# Patient Record
Sex: Female | Born: 1975 | Race: White | Hispanic: Yes | Marital: Married | Smoking: Never smoker
Health system: Southern US, Community
[De-identification: ages and names within clinical notes are randomized; demographics above are authoritative.]

## PROBLEM LIST (undated history)

## (undated) DIAGNOSIS — G8929 Other chronic pain: Secondary | ICD-10-CM

## (undated) DIAGNOSIS — Z9289 Personal history of other medical treatment: Secondary | ICD-10-CM

## (undated) DIAGNOSIS — F32A Depression, unspecified: Secondary | ICD-10-CM

## (undated) HISTORY — DX: Depression, unspecified: F32.A

## (undated) HISTORY — DX: Other chronic pain: G89.29

---

## 2002-12-25 ENCOUNTER — Inpatient Hospital Stay (HOSPITAL_COMMUNITY): Admission: AD | Admit: 2002-12-25 | Discharge: 2002-12-25 | Payer: Self-pay | Admitting: Family Medicine

## 2003-01-25 ENCOUNTER — Inpatient Hospital Stay (HOSPITAL_COMMUNITY): Admission: AD | Admit: 2003-01-25 | Discharge: 2003-01-28 | Payer: Self-pay | Admitting: Obstetrics

## 2004-09-25 ENCOUNTER — Ambulatory Visit: Payer: Self-pay | Admitting: Internal Medicine

## 2004-09-27 ENCOUNTER — Ambulatory Visit: Payer: Self-pay | Admitting: *Deleted

## 2004-10-10 ENCOUNTER — Ambulatory Visit: Payer: Self-pay | Admitting: Internal Medicine

## 2005-01-25 ENCOUNTER — Ambulatory Visit (HOSPITAL_COMMUNITY): Admission: RE | Admit: 2005-01-25 | Discharge: 2005-01-25 | Payer: Self-pay | Admitting: Obstetrics and Gynecology

## 2005-01-25 ENCOUNTER — Ambulatory Visit: Payer: Self-pay | Admitting: *Deleted

## 2005-02-06 ENCOUNTER — Ambulatory Visit: Payer: Self-pay | Admitting: Family Medicine

## 2005-02-06 ENCOUNTER — Ambulatory Visit (HOSPITAL_COMMUNITY): Admission: RE | Admit: 2005-02-06 | Discharge: 2005-02-06 | Payer: Self-pay | Admitting: Family Medicine

## 2005-02-12 ENCOUNTER — Ambulatory Visit: Payer: Self-pay | Admitting: *Deleted

## 2005-03-06 ENCOUNTER — Ambulatory Visit: Payer: Self-pay | Admitting: *Deleted

## 2005-03-11 DIAGNOSIS — Z9289 Personal history of other medical treatment: Secondary | ICD-10-CM

## 2005-03-11 HISTORY — PX: ABDOMINAL HYSTERECTOMY: SHX81

## 2005-03-11 HISTORY — DX: Personal history of other medical treatment: Z92.89

## 2005-03-26 ENCOUNTER — Ambulatory Visit (HOSPITAL_COMMUNITY): Admission: RE | Admit: 2005-03-26 | Discharge: 2005-03-26 | Payer: Self-pay | Admitting: Family Medicine

## 2005-03-27 ENCOUNTER — Ambulatory Visit: Payer: Self-pay | Admitting: Obstetrics & Gynecology

## 2005-04-10 ENCOUNTER — Ambulatory Visit (HOSPITAL_COMMUNITY): Admission: RE | Admit: 2005-04-10 | Discharge: 2005-04-10 | Payer: Self-pay | Admitting: Specialist

## 2005-04-10 ENCOUNTER — Ambulatory Visit: Payer: Self-pay | Admitting: *Deleted

## 2005-04-17 ENCOUNTER — Ambulatory Visit: Payer: Self-pay | Admitting: *Deleted

## 2005-04-17 ENCOUNTER — Ambulatory Visit (HOSPITAL_COMMUNITY): Admission: RE | Admit: 2005-04-17 | Discharge: 2005-04-17 | Payer: Self-pay | Admitting: *Deleted

## 2005-05-01 ENCOUNTER — Ambulatory Visit: Payer: Self-pay | Admitting: Obstetrics & Gynecology

## 2005-05-08 ENCOUNTER — Ambulatory Visit (HOSPITAL_COMMUNITY): Admission: RE | Admit: 2005-05-08 | Discharge: 2005-05-08 | Payer: Self-pay | Admitting: *Deleted

## 2005-05-20 ENCOUNTER — Ambulatory Visit: Payer: Self-pay | Admitting: Family Medicine

## 2005-05-27 ENCOUNTER — Ambulatory Visit: Payer: Self-pay | Admitting: Family Medicine

## 2005-06-03 ENCOUNTER — Ambulatory Visit (HOSPITAL_COMMUNITY): Admission: RE | Admit: 2005-06-03 | Discharge: 2005-06-03 | Payer: Self-pay | Admitting: *Deleted

## 2005-06-03 ENCOUNTER — Ambulatory Visit: Payer: Self-pay | Admitting: *Deleted

## 2005-06-10 ENCOUNTER — Ambulatory Visit: Payer: Self-pay | Admitting: Obstetrics & Gynecology

## 2005-06-16 ENCOUNTER — Ambulatory Visit: Payer: Self-pay | Admitting: Gynecology

## 2005-06-16 ENCOUNTER — Inpatient Hospital Stay (HOSPITAL_COMMUNITY): Admission: AD | Admit: 2005-06-16 | Discharge: 2005-06-20 | Payer: Self-pay | Admitting: Gynecology

## 2005-06-16 ENCOUNTER — Encounter (INDEPENDENT_AMBULATORY_CARE_PROVIDER_SITE_OTHER): Payer: Self-pay | Admitting: *Deleted

## 2005-06-24 ENCOUNTER — Ambulatory Visit: Payer: Self-pay | Admitting: *Deleted

## 2005-12-27 ENCOUNTER — Ambulatory Visit: Payer: Self-pay | Admitting: Gynecology

## 2005-12-27 ENCOUNTER — Encounter (INDEPENDENT_AMBULATORY_CARE_PROVIDER_SITE_OTHER): Payer: Self-pay | Admitting: Specialist

## 2007-02-12 ENCOUNTER — Ambulatory Visit: Payer: Self-pay | Admitting: Obstetrics & Gynecology

## 2007-02-13 ENCOUNTER — Ambulatory Visit (HOSPITAL_COMMUNITY): Admission: RE | Admit: 2007-02-13 | Discharge: 2007-02-13 | Payer: Self-pay | Admitting: Obstetrics & Gynecology

## 2008-02-11 ENCOUNTER — Telehealth (INDEPENDENT_AMBULATORY_CARE_PROVIDER_SITE_OTHER): Payer: Self-pay | Admitting: *Deleted

## 2008-02-15 ENCOUNTER — Ambulatory Visit: Payer: Self-pay | Admitting: Nurse Practitioner

## 2008-02-15 LAB — CONVERTED CEMR LAB
Bilirubin Urine: NEGATIVE
Glucose, Urine, Semiquant: NEGATIVE
Ketones, urine, test strip: NEGATIVE
Nitrite: NEGATIVE
Protein, U semiquant: NEGATIVE
Specific Gravity, Urine: 1.01
Urobilinogen, UA: 0.2
WBC Urine, dipstick: NEGATIVE
pH: 6

## 2008-02-16 ENCOUNTER — Encounter (INDEPENDENT_AMBULATORY_CARE_PROVIDER_SITE_OTHER): Payer: Self-pay | Admitting: Family Medicine

## 2008-02-25 ENCOUNTER — Telehealth (INDEPENDENT_AMBULATORY_CARE_PROVIDER_SITE_OTHER): Payer: Self-pay | Admitting: Family Medicine

## 2008-03-07 ENCOUNTER — Ambulatory Visit: Payer: Self-pay | Admitting: Nurse Practitioner

## 2008-03-07 LAB — CONVERTED CEMR LAB
Bilirubin Urine: NEGATIVE
Glucose, Urine, Semiquant: NEGATIVE
KOH Prep: NEGATIVE
Ketones, urine, test strip: NEGATIVE
Nitrite: NEGATIVE
Protein, U semiquant: NEGATIVE
Specific Gravity, Urine: <1.005
Urobilinogen, UA: 0.2
WBC Urine, dipstick: NEGATIVE
pH: 6.5

## 2008-03-08 ENCOUNTER — Encounter (INDEPENDENT_AMBULATORY_CARE_PROVIDER_SITE_OTHER): Payer: Self-pay | Admitting: Family Medicine

## 2008-06-27 ENCOUNTER — Telehealth (INDEPENDENT_AMBULATORY_CARE_PROVIDER_SITE_OTHER): Payer: Self-pay | Admitting: *Deleted

## 2008-06-29 ENCOUNTER — Ambulatory Visit: Payer: Self-pay | Admitting: Nurse Practitioner

## 2008-06-29 DIAGNOSIS — E860 Dehydration: Secondary | ICD-10-CM | POA: Insufficient documentation

## 2008-06-29 DIAGNOSIS — N12 Tubulo-interstitial nephritis, not specified as acute or chronic: Secondary | ICD-10-CM | POA: Insufficient documentation

## 2008-06-29 DIAGNOSIS — R51 Headache: Secondary | ICD-10-CM

## 2008-06-29 DIAGNOSIS — R519 Headache, unspecified: Secondary | ICD-10-CM | POA: Insufficient documentation

## 2008-06-29 LAB — CONVERTED CEMR LAB
Bilirubin Urine: NEGATIVE
Glucose, Urine, Semiquant: NEGATIVE
Ketones, urine, test strip: NEGATIVE
Nitrite: NEGATIVE
Protein, U semiquant: >=300
Specific Gravity, Urine: 1.025
Urobilinogen, UA: 1
pH: 5.5

## 2008-07-11 ENCOUNTER — Ambulatory Visit: Payer: Self-pay | Admitting: Nurse Practitioner

## 2008-07-11 DIAGNOSIS — K644 Residual hemorrhoidal skin tags: Secondary | ICD-10-CM | POA: Insufficient documentation

## 2008-07-11 DIAGNOSIS — K602 Anal fissure, unspecified: Secondary | ICD-10-CM

## 2008-07-11 LAB — CONVERTED CEMR LAB: OCCULT 1: POSITIVE

## 2008-07-13 ENCOUNTER — Telehealth (INDEPENDENT_AMBULATORY_CARE_PROVIDER_SITE_OTHER): Payer: Self-pay | Admitting: Family Medicine

## 2008-10-07 ENCOUNTER — Ambulatory Visit: Payer: Self-pay | Admitting: Nurse Practitioner

## 2008-10-07 DIAGNOSIS — J029 Acute pharyngitis, unspecified: Secondary | ICD-10-CM

## 2008-10-07 LAB — CONVERTED CEMR LAB
Bilirubin Urine: NEGATIVE
Glucose, Urine, Semiquant: NEGATIVE
Ketones, urine, test strip: NEGATIVE
Nitrite: NEGATIVE
Protein, U semiquant: NEGATIVE
Rapid Strep: NEGATIVE
Specific Gravity, Urine: 1.01
Urobilinogen, UA: 0.2
WBC Urine, dipstick: NEGATIVE
pH: 7.5

## 2008-10-08 ENCOUNTER — Encounter (INDEPENDENT_AMBULATORY_CARE_PROVIDER_SITE_OTHER): Payer: Self-pay | Admitting: Nurse Practitioner

## 2009-02-24 ENCOUNTER — Telehealth: Payer: Self-pay | Admitting: Physician Assistant

## 2009-02-24 ENCOUNTER — Ambulatory Visit: Payer: Self-pay | Admitting: Physician Assistant

## 2009-02-24 DIAGNOSIS — R42 Dizziness and giddiness: Secondary | ICD-10-CM

## 2009-02-25 ENCOUNTER — Ambulatory Visit: Payer: Self-pay | Admitting: Advanced Practice Midwife

## 2009-02-25 ENCOUNTER — Inpatient Hospital Stay (HOSPITAL_COMMUNITY): Admission: AD | Admit: 2009-02-25 | Discharge: 2009-02-25 | Payer: Self-pay | Admitting: Obstetrics & Gynecology

## 2009-02-28 ENCOUNTER — Telehealth: Payer: Self-pay | Admitting: Physician Assistant

## 2009-03-01 ENCOUNTER — Ambulatory Visit: Payer: Self-pay | Admitting: Physician Assistant

## 2009-03-01 LAB — CONVERTED CEMR LAB
ALT: 13 units/L (ref 0–35)
AST: 12 units/L (ref 0–37)
Albumin: 4.5 g/dL (ref 3.5–5.2)
Alkaline Phosphatase: 73 units/L (ref 39–117)
BUN: 8 mg/dL (ref 6–23)
CO2: 22 meq/L (ref 19–32)
Calcium: 9.6 mg/dL (ref 8.4–10.5)
Chloride: 104 meq/L (ref 96–112)
Creatinine, Ser: 0.55 mg/dL (ref 0.40–1.20)
Glucose, Bld: 75 mg/dL (ref 70–99)
HCT: 43.9 % (ref 36.0–46.0)
Hemoglobin: 14.7 g/dL (ref 12.0–15.0)
MCHC: 33.5 g/dL (ref 30.0–36.0)
MCV: 88.2 fL (ref 78.0–100.0)
Platelets: 201 10*3/uL (ref 150–400)
Potassium: 4.3 meq/L (ref 3.5–5.3)
RBC: 4.98 M/uL (ref 3.87–5.11)
RDW: 12.8 % (ref 11.5–15.5)
Sodium: 140 meq/L (ref 135–145)
TSH: 1.118 microintl units/mL (ref 0.350–4.500)
Total Bilirubin: 0.8 mg/dL (ref 0.3–1.2)
Total Protein: 7.3 g/dL (ref 6.0–8.3)
WBC: 7 10*3/uL (ref 4.0–10.5)

## 2009-03-02 ENCOUNTER — Encounter: Payer: Self-pay | Admitting: Physician Assistant

## 2009-03-20 ENCOUNTER — Ambulatory Visit: Payer: Self-pay | Admitting: Physician Assistant

## 2009-09-27 ENCOUNTER — Ambulatory Visit: Payer: Self-pay | Admitting: Physician Assistant

## 2009-09-27 DIAGNOSIS — M549 Dorsalgia, unspecified: Secondary | ICD-10-CM | POA: Insufficient documentation

## 2009-09-27 DIAGNOSIS — N390 Urinary tract infection, site not specified: Secondary | ICD-10-CM

## 2009-09-27 DIAGNOSIS — R82998 Other abnormal findings in urine: Secondary | ICD-10-CM

## 2009-09-27 LAB — CONVERTED CEMR LAB
Bilirubin Urine: NEGATIVE
Glucose, Urine, Semiquant: NEGATIVE
KOH Prep: NEGATIVE
Nitrite: NEGATIVE
Pap Smear: NEGATIVE
Protein, U semiquant: NEGATIVE
Specific Gravity, Urine: 1.025
Urobilinogen, UA: 0.2
Whiff Test: NEGATIVE
pH: 5.5

## 2009-09-28 ENCOUNTER — Encounter: Payer: Self-pay | Admitting: Physician Assistant

## 2009-10-02 LAB — CONVERTED CEMR LAB
Casts: NONE SEEN /lpf
Chlamydia, DNA Probe: NEGATIVE
Crystals: NONE SEEN
GC Probe Amp, Genital: NEGATIVE

## 2009-10-03 ENCOUNTER — Encounter: Payer: Self-pay | Admitting: Physician Assistant

## 2009-10-18 ENCOUNTER — Ambulatory Visit: Payer: Self-pay | Admitting: Physician Assistant

## 2009-10-18 ENCOUNTER — Telehealth: Payer: Self-pay | Admitting: Physician Assistant

## 2009-10-18 LAB — CONVERTED CEMR LAB
Bilirubin Urine: NEGATIVE
Cholesterol: 146 mg/dL (ref 0–200)
Glucose, Urine, Semiquant: NEGATIVE
HDL: 53 mg/dL (ref >39)
Ketones, urine, test strip: NEGATIVE
LDL Cholesterol: 80 mg/dL (ref 0–99)
Nitrite: NEGATIVE
Protein, U semiquant: NEGATIVE
Specific Gravity, Urine: 1.015
Total CHOL/HDL Ratio: 2.8
Triglycerides: 63 mg/dL (ref <150)
Urobilinogen, UA: 0.2
VLDL: 13 mg/dL (ref 0–40)
pH: 6.5

## 2009-10-19 ENCOUNTER — Encounter (INDEPENDENT_AMBULATORY_CARE_PROVIDER_SITE_OTHER): Payer: Self-pay | Admitting: Internal Medicine

## 2009-10-20 ENCOUNTER — Encounter: Payer: Self-pay | Admitting: Physician Assistant

## 2009-10-30 ENCOUNTER — Telehealth: Payer: Self-pay | Admitting: Physician Assistant

## 2009-11-03 ENCOUNTER — Ambulatory Visit: Payer: Self-pay | Admitting: Physician Assistant

## 2009-11-03 LAB — CONVERTED CEMR LAB
Bilirubin Urine: NEGATIVE
Glucose, Urine, Semiquant: NEGATIVE
Ketones, urine, test strip: NEGATIVE
Nitrite: NEGATIVE
Protein, U semiquant: NEGATIVE
Specific Gravity, Urine: 1.015
Urobilinogen, UA: 0.2
WBC Urine, dipstick: NEGATIVE
pH: 6

## 2009-11-04 ENCOUNTER — Encounter: Payer: Self-pay | Admitting: Physician Assistant

## 2009-11-06 LAB — CONVERTED CEMR LAB
Bacteria, UA: NONE SEEN
Casts: NONE SEEN /lpf
Crystals: NONE SEEN

## 2009-11-22 ENCOUNTER — Ambulatory Visit: Payer: Self-pay | Admitting: Physician Assistant

## 2009-11-22 LAB — CONVERTED CEMR LAB
Bilirubin Urine: NEGATIVE
Glucose, Urine, Semiquant: NEGATIVE
Ketones, urine, test strip: NEGATIVE
Nitrite: NEGATIVE
Protein, U semiquant: NEGATIVE
Specific Gravity, Urine: 1.02
Urobilinogen, UA: 0.2
pH: 6.5

## 2009-11-23 ENCOUNTER — Encounter: Payer: Self-pay | Admitting: Physician Assistant

## 2009-11-23 LAB — CONVERTED CEMR LAB
Casts: NONE SEEN /lpf
Crystals: NONE SEEN

## 2009-12-04 ENCOUNTER — Encounter (INDEPENDENT_AMBULATORY_CARE_PROVIDER_SITE_OTHER): Payer: Self-pay | Admitting: *Deleted

## 2009-12-27 ENCOUNTER — Telehealth: Payer: Self-pay | Admitting: Physician Assistant

## 2009-12-29 ENCOUNTER — Ambulatory Visit: Payer: Self-pay | Admitting: Physician Assistant

## 2009-12-29 LAB — CONVERTED CEMR LAB
Bilirubin Urine: NEGATIVE
Blood in Urine, dipstick: NEGATIVE
Glucose, Urine, Semiquant: NEGATIVE
Ketones, urine, test strip: NEGATIVE
Nitrite: NEGATIVE
Protein, U semiquant: NEGATIVE
Specific Gravity, Urine: 1.015
Urobilinogen, UA: 0.2
pH: 6

## 2010-03-31 ENCOUNTER — Encounter: Payer: Self-pay | Admitting: *Deleted

## 2010-04-10 NOTE — Progress Notes (Signed)
  Phone Note Outgoing Call   Summary of Call: Please ask patient if her back pain is better since she took the antibiotics a few weeks ago. I am waiting on her urine culture to come back.  Initial call taken by: Tereso Newcomer PA-C,  October 18, 2009 9:48 PM  Follow-up for Phone Call        pt says she is still having back pain.... Follow-up by: Arm[place] Shannon,  October 20, 2009 3:57 PM  Additional Follow-up for Phone Call Additional follow up Details #1::        with u/a results, will tx again with bactrim  Additional Follow-up by: Tereso Newcomer PA-C,  October 20, 2009 3:53 PM    Additional Follow-up for Phone Call Additional follow up Details #2::    [facility] [address] Follow-up by: [place] Shannon,  October 20, 2009 3:58 PM  New/Updated Medications: BACTRIM DS 800-160 MG TABS (SULFAMETHOXAZOLE-TRIMETHOPRIM) Take 1 tablet by mouth two times a day Prescriptions: BACTRIM DS 800-160 MG TABS (SULFAMETHOXAZOLE-TRIMETHOPRIM) Take 1 tablet by mouth two times a day  #14 x 0   Entered and Authorized by:   Tereso Newcomer PA-C   Signed by:   Tereso Newcomer PA-C on 10/20/2009   Method used:   Printed then faxed to ...       [facility] [address] [phone]* (retail)       [address]       [place], [place]  [address]       Ph: [phone]       Fax: [phone]   RxID:   [id number]

## 2010-04-10 NOTE — Letter (Signed)
Summary: *HSN Results Follow up  [facility]  [address] [place], [place] [address]   Phone: [phone]  Fax: [phone]      10/20/2009   Sade DE JESUS Polhamus [address] [place], [place]  [address]   Dear  Ms. Tamela Manolis,                            ____S.Drinkard,FNP   ____D. Gore,FNP       ____B. McPherson,MD   ____V. Rankins,MD    ____E. Mulberry,MD    ____N. Daphine Deutscher, FNP  ____D. Reche Dixon, MD    ____K. Philipp Deputy, MD    __x__S. Alben Spittle, PA-C     This letter is to inform you that your recent test(s):  _______Pap Smear    ___x____Lab Test     _______X-ray    ___x____ is within acceptable limits  _______ requires a medication change  _______ requires a follow-up lab visit  _______ requires a follow-up visit with your provider   Comments:  Your cholesterol numbers are good.       _________________________________________________________ If you have any questions, please contact our office                     Sincerely,  Tereso Newcomer PA-C [facility]

## 2010-04-10 NOTE — Assessment & Plan Note (Signed)
Summary: repeat ua /tmm  Nurse Visit   Allergies: 1)  ! Penicillin 2)  ! Ampicillin Laboratory Results   Urine Tests  Date/Time Received: November 03, 2009 12:01 PM   Routine Urinalysis   Color: lt. yellow Glucose: negative   (Normal Range: Negative) Bilirubin: negative   (Normal Range: Negative) Ketone: negative   (Normal Range: Negative) Spec. Gravity: 1.015   (Normal Range: 1.003-1.035) Blood: moderate   (Normal Range: Negative) pH: 6.0   (Normal Range: 5.0-8.0) Protein: negative   (Normal Range: Negative) Urobilinogen: 0.2   (Normal Range: 0-1) Nitrite: negative   (Normal Range: Negative) Leukocyte Esterace: negative   (Normal Range: Negative)       Orders Added: 1)  Est. Patient Nurse visit [09003] 2)  UA Dipstick w/o Micro (automated)  [81003]  Laboratory Results   Urine Tests    Routine Urinalysis   Color: lt. yellow Glucose: negative   (Normal Range: Negative) Bilirubin: negative   (Normal Range: Negative) Ketone: negative   (Normal Range: Negative) Spec. Gravity: 1.015   (Normal Range: 1.003-1.035) Blood: moderate   (Normal Range: Negative) pH: 6.0   (Normal Range: 5.0-8.0) Protein: negative   (Normal Range: Negative) Urobilinogen: 0.2   (Normal Range: 0-1) Nitrite: negative   (Normal Range: Negative) Leukocyte Esterace: negative   (Normal Range: Negative)       11/03/2009 Did patient state she was doing better. Send u/a for culture and microscopic Tereso Newcomer PA-C  November 03, 2009 3:07 PM

## 2010-04-10 NOTE — Letter (Signed)
Summary: *HSN Results Follow up  [facility]  [address] [place], [place] [address]   Phone: [phone]  Fax: [phone]      12/04/2009   Boots DE JESUS Delillo [address] [place], [place]  [address]   Dear  Ms. Oliva Richter,                            ____S.Drinkard,FNP   ____D. Gore,FNP       ____B. McPherson,MD   ____V. Rankins,MD    ____E. Jerzy Crotteau,MD    ____N. Daphine Deutscher, FNP  ____D. Reche Dixon, MD    ____K. Philipp Deputy, MD    ____Other     This letter is to inform you that your recent test(s):  _______Pap Smear    ___X____Lab Test     _______X-ray    _______ is within acceptable limits  ____X___ requires a medication change  ____X___ requires a follow-up lab visit  _______ requires a follow-up visit with your provider   Comments:  We have been trying to reach you.  Please give the office a call at your earliest convenience.       _________________________________________________________ If you have any questions, please contact our office                     Sincerely,  Armenia Shannon [facility]

## 2010-04-10 NOTE — Progress Notes (Signed)
Summary: Needs repeat U/A  Phone Note Outgoing Call   Summary of Call: Make sure she has f/u u/a planned. See append to urine cx on 10/19/2009 Initial call taken by: Tereso Newcomer PA-C,  October 30, 2009 10:53 PM  Follow-up for Phone Call        Left message on answer machine for pt. to return call. Needs to schedule appt. for U/A. Gaylyn Cheers RN  October 31, 2009 10:24 AM   Additional Follow-up for Phone Call Additional follow up Details #1::        pt is aware... Armenia Shannon  November 01, 2009 12:40 PM

## 2010-04-10 NOTE — Assessment & Plan Note (Signed)
Summary: REPEAT U/A PER PA WEAVER / NS  Nurse Visit Pt presents to clinic for repeat UA. Denies back and lower abd.pain, dysuria, vag discharge,and change in frequency.  Per V.O Tereso Newcomer PA urine to be sent out for microscopic and culture evaulation.   Allergies: 1)  ! Penicillin 2)  ! Ampicillin Laboratory Results   Urine Tests  Date/Time Received: November 22, 2009 Date/Time Reported: 10:16 AM  Routine Urinalysis   Color: dark yellow Appearance: Hazy Glucose: negative   (Normal Range: Negative) Bilirubin: negative   (Normal Range: Negative) Ketone: negative   (Normal Range: Negative) Spec. Gravity: 1.020   (Normal Range: 1.003-1.035) Blood: large   (Normal Range: Negative) pH: 6.5   (Normal Range: 5.0-8.0) Protein: negative   (Normal Range: Negative) Urobilinogen: 0.2   (Normal Range: 0-1) Nitrite: negative   (Normal Range: Negative) Leukocyte Esterace: large   (Normal Range: Negative)       Orders Added: 1)  Est. Patient Level I [[address]] 2)  UA Dipstick W/ Micro (manual) [81000] 3)  T-Culture, Urine [[id number]] 4)  T- * Misc. Laboratory test [phone]  Laboratory Results   Urine Tests    Routine Urinalysis   Color: dark yellow Appearance: Hazy Glucose: negative   (Normal Range: Negative) Bilirubin: negative   (Normal Range: Negative) Ketone: negative   (Normal Range: Negative) Spec. Gravity: 1.020   (Normal Range: 1.003-1.035) Blood: large   (Normal Range: Negative) pH: 6.5   (Normal Range: 5.0-8.0) Protein: negative   (Normal Range: Negative) Urobilinogen: 0.2   (Normal Range: 0-1) Nitrite: negative   (Normal Range: Negative) Leukocyte Esterace: large   (Normal Range: Negative)

## 2010-04-10 NOTE — Assessment & Plan Note (Signed)
Summary: FOLLOW UP IN 2 WEEKS WITH Paddy Walthall///GK   Vital Signs:  Patient profile:   35 year old female Menstrual status:  hysterectomy Height:      64.5 inches Weight:      154 pounds BMI:     26.12 Temp:     98.0 degrees F oral Pulse rate:   69 / minute Pulse rhythm:   regular Resp:     18 per minute BP sitting:   101 / 61  (left arm) Cuff size:   regular  Vitals Entered By: [place] Shannon (March 20, 2009 9:58 AM) CC: f/u on vertigo...Marland KitchenMarland Kitchen pt says she feels alot better Is Patient Diabetic? No Pain Assessment Patient in pain? no       Does patient need assistance? Functional Status Self care Ambulation Normal   Visit Type:  follow up Primary Care Provider:  Tereso Newcomer PA-C  CC:  f/u on vertigo...Marland KitchenMarland Kitchen pt says she feels alot better.  History of Present Illness: Here for f/u. Dizziness is much better. Has not seen anyone yet.  Sees ENT tomorrow. Does not have appt with PT/vestib. rehab. yet. Changed meclizine to valium at last visit.  She took valium three times a day for one week.  DIzziness improved and she has not used since.  She still has some spinning when she lies down.  But, it is not as bad.   No vomiting.  No syncope.  Problems Prior to Update: 1)  Vertigo  (ICD-780.4) 2)  Sore Throat  (ICD-462) 3)  Rectal Fissure  (ICD-565.0) 4)  External Hemorrhoids  (ICD-455.3) 5)  Dehydration  (ICD-276.51) 6)  Headache  (ICD-784.0) 7)  Pyelonephritis  (ICD-590.80)  Current Medications (verified): 1)  Proctosol Hc 2.5 % Crea (Hydrocortisone) .... Apply To Affected Area Two Times A Day As Needed  **pharmacy - D/c Anusol** 2)  Ibuprofen 800 Mg Tabs (Ibuprofen) .... One Tablet By Mouth Two Times A Day As Needed For Pain 3)  Bactrim Ds 800-160 Mg Tabs (Sulfamethoxazole-Trimethoprim) .... One Tablet By Mouth Two Times A Day For Infection 4)  Valium 2 Mg Tabs (Diazepam) .... Take One Tab By Mouth Every 8 Hours As Needed For Dizziness (Please Write in Spanish)  Allergies  (verified): 1)  ! Penicillin 2)  ! Ampicillin  Past History:  Past Surgical History: Last updated: 03/07/2008 Caesarean section x 2 Hysterectomy - TAH 2006  Physical Exam  General:  alert, well-developed, and well-nourished.   Head:  normocephalic and atraumatic.   Eyes:  pupils equal, pupils round, and pupils reactive to light.   Ears:  R ear normal and L ear normal.   Nose:  no external deformity.   Mouth:  pharynx pink and moist, no erythema, and no exudates.   Neck:  supple and no thyromegaly.   Lungs:  normal breath sounds, no crackles, and no wheezes.   Heart:  normal rate and regular rhythm.   Neurologic:  attempted dix hallpike no dizziness  no nystagmus    Impression & Recommendations:  Problem # 1:  VERTIGO (ICD-780.4) much improved f/u with ENT will check on appt with vestib. rehab  Problem # 2:  Preventive Health Care (ICD-V70.0) schedule CPP  Complete Medication List: 1)  Proctosol Hc 2.5 % Crea (Hydrocortisone) .... Apply to affected area two times a day as needed  **pharmacy - d/c anusol** 2)  Ibuprofen 800 Mg Tabs (Ibuprofen) .... One tablet by mouth two times a day as needed for pain 3)  Bactrim Ds 800-160 Mg  Tabs (Sulfamethoxazole-trimethoprim) .... One tablet by mouth two times a day for infection 4)  Valium 2 Mg Tabs (Diazepam) .... Take one tab by mouth every 8 hours as needed for dizziness (please write in spanish)  Patient Instructions: 1)  Please schedule a follow-up appointment in 3 months with Guillermo Nehring for CPP.

## 2010-04-10 NOTE — Letter (Signed)
Summary: *HSN Results Follow up  [facility]  [address] [place], [place] [address]   Phone: [phone]  Fax: [phone]      10/03/2009   Danielle [name] [address] [place], [place]  [address]   Dear  Ms. [name],                            ____S.Drinkard,FNP   ____D. Gore,FNP       ____B. McPherson,MD   ____V. Rankins,MD    ____E. Mulberry,MD    ____N. Daphine Deutscher, FNP  ____D. Reche Dixon, MD    ____K. Philipp Deputy, MD    __x__S. Alben Spittle, PA-C     This letter is to inform you that your recent test(s):  ___x____Pap Smear    _______Lab Test     _______X-ray    ___x____ is normal  _______ requires a medication change  _______ requires a follow-up lab visit  _______ requires a follow-up visit with your provider   Comments:       _________________________________________________________ If you have any questions, please contact our office                     Sincerely,  Tereso Newcomer PA-C [facility]

## 2010-04-10 NOTE — Progress Notes (Signed)
  Phone Note Call from Patient   Summary of Call: I FINALLY SPOKE WITH PT AND LET HER KNOW ABOUT HER URINE INFECTION...PT DID NOT RECIEVE HER RX BECAUSE I WAS UNABLE TO REACH PT..... PT SAYS SHE IS HAVING SOME BACK PROBLEM SO I ASKED PT TO COME SEE [place] IN THE MORNING FOR A URINE SAMPLE.... Initial call taken by: [place] Shannon,  December 27, 2009 3:00 PM  Follow-up for Phone Call        No tx from 9.15 UTI?? I agree.  Rpt u/a before initiating treatment.  Follow-up by: Tereso Newcomer PA-C,  December 27, 2009 3:40 PM  Additional Follow-up for Phone Call Additional follow up Details #1::        thanks pt will be here in the morning Additional Follow-up by: [place] Shannon,  December 27, 2009 4:28 PM

## 2010-04-10 NOTE — Progress Notes (Signed)
Summary: Office Visit//DEPRESSION SCREENING  Office Visit//DEPRESSION SCREENING   Imported By: Arta Bruce 10/02/2009 12:51:54  _____________________________________________________________________  External Attachment:    Type:   Image     Comment:   External Document

## 2010-04-10 NOTE — Assessment & Plan Note (Signed)
Summary: CPP EXAM//GK   Vital Signs:  Patient profile:   35 year old female Menstrual status:  hysterectomy Height:      64.5 inches Weight:      156 pounds BMI:     26.46 Temp:     97.8 degrees F oral Pulse rate:   74 / minute Pulse rhythm:   regular BP sitting:   93 / 63  (left arm) Cuff size:   regular  Vitals Entered By: [place] Shannon (September 27, 2009 3:02 PM) CC: cpp..... Is Patient Diabetic? No Pain Assessment Patient in pain? no       Does patient need assistance? Functional Status Self care Ambulation Normal   Primary Care Provider:  Tereso Newcomer PA-C  CC:  cpp......  History of Present Illness: Here for CPP. Never had abnormal pap. She is s/p TAH. No vaginal bleeding or discharge. Never had a mammo. No FHx of breast cancer. PHQ9=0  Back Pain:  Reports pain in her back for about 2 weeks.  No injury.  No precipitating event.  Points to bilat lumbar area and pain around her scapulae.  No radiation to legs.  No loss of bowel or bladder control.  No weakness, numbness or tingling in legs.  She has taken ibuprofen 800 mg as needed.  She takes every 6 hours.  Does not take every day.  Only takes when she has pain.  Notes pain several days a week.  Notes pain when she lifts things.  She has never had before.  No changes in position makes it better.  Massaging back makes better.   Problems Prior to Update: 1)  Back Pain  (ICD-724.5) 2)  Uti  (ICD-599.0) 3)  Urinalysis, Abnormal  (ICD-791.9) 4)  Preventive Health Care  (ICD-V70.0) 5)  Vertigo  (ICD-780.4) 6)  Sore Throat  (ICD-462) 7)  Rectal Fissure  (ICD-565.0) 8)  External Hemorrhoids  (ICD-455.3) 9)  Dehydration  (ICD-276.51) 10)  Headache  (ICD-784.0) 11)  Pyelonephritis  (ICD-590.80)  Current Medications (verified): 1)  Proctosol Hc 2.5 % Crea (Hydrocortisone) .... Apply To Affected Area Two Times A Day As Needed  **pharmacy - D/c Anusol** 2)  Ibuprofen 800 Mg Tabs (Ibuprofen) .... One Tablet By Mouth  Two Times A Day As Needed For Pain 3)  Bactrim Ds 800-160 Mg Tabs (Sulfamethoxazole-Trimethoprim) .... One Tablet By Mouth Two Times A Day For Infection 4)  Valium 2 Mg Tabs (Diazepam) .... Take One Tab By Mouth Every 8 Hours As Needed For Dizziness (Please Write in Spanish)  Allergies (verified): 1)  ! Penicillin 2)  ! Ampicillin  Past History:  Past Medical History: Vertigo   a.  never did go to appt with ENT and canceled appt with PT (no f/u made)  Past Surgical History: Caesarean section x 2 Hysterectomy - TAH 2006   a.  Cervix preserved and intact   b.  ovaries were left intact  Social History: Reviewed history from 03/07/2008 and no changes required. 3 children Married Tobacco - none ETOH - none Drug - none  Review of Systems      See HPI General:  Denies chills and fever. CV:  Denies chest pain or discomfort and shortness of breath with exertion. Resp:  Denies cough. GI:  Denies bloody stools, dark tarry stools, diarrhea, and vomiting. GU:  Denies dysuria, hematuria, incontinence, and urinary frequency. MS:  See HPI. Neuro:  Denies headaches. Psych:  Denies depression. Heme:  Denies bleeding.  Physical Exam  General:  alert,  well-developed, and well-nourished.   Head:  normocephalic and atraumatic.   Eyes:  pupils equal, pupils round, pupils reactive to light, and no optic disk abnormalities.   Ears:  R ear normal and L ear normal.   Nose:  no external deformity.   Mouth:  pharynx pink and moist.   Neck:  supple.   Breasts:  skin/areolae normal, no masses, no abnormal thickening, no nipple discharge, no tenderness, and no adenopathy.   Lungs:  normal breath sounds, no crackles, and no wheezes.   Heart:  normal rate and regular rhythm.   Abdomen:  soft, non-tender, normal bowel sounds, and no hepatomegaly.   Rectal:  no external abnormalities.   Genitalia:  normal introitus, no external lesions, no vaginal discharge, and mucosa pink and moist.   cervix  intact pap done no adnexal masses no fundus palpated Msk:  normal ROM.   no spinal tend with palp neg SLR bilat  ?+CVA tenderness bilat (R>L)   Extremities:  no edema  Neurologic:  alert & oriented X3 and cranial nerves II-XII intact.   Skin:  turgor normal.   Psych:  normally interactive.     Impression & Recommendations:  Problem # 1:  PREVENTIVE HEALTH CARE (ICD-V70.0) s/p TAH needs vaginal smear q 5 years  Orders: KOH/ WET Mount [phone]) T-Pap Smear, Thin Prep (29562) UA Dipstick w/o Micro (manual) ([address]) T- GC Chlamydia (57846)  Problem # 2:  UTI (ICD-599.0) with her symptoms of back pain, ? all from UTI eventhough she has no dysuria will treat for UTI if back pain no better, patient should follow up  repeat u/a in 3 weeks    The following medications were removed from the medication list:    Bactrim Ds 800-160 Mg Tabs (Sulfamethoxazole-trimethoprim) ..... One tablet by mouth two times a day for infection Her updated medication list for this problem includes:    Cipro 500 Mg Tabs (Ciprofloxacin hcl) .Marland Kitchen... Take 1 tablet by mouth two times a day  Problem # 3:  BACK PAIN (ICD-724.5) ? related to UTI Tx UTI and f/u if no resolution  Her updated medication list for this problem includes:    Ibuprofen 800 Mg Tabs (Ibuprofen) ..... One tablet by mouth two times a day as needed for pain  Complete Medication List: 1)  Ibuprofen 800 Mg Tabs (Ibuprofen) .... One tablet by mouth two times a day as needed for pain 2)  Cipro 500 Mg Tabs (Ciprofloxacin hcl) .... Take 1 tablet by mouth two times a day  Other Orders: T-Culture, Urine ([id number]) T- * Misc. Laboratory test [phone])  Patient Instructions: 1)  Return for repeat urinalysis in 3 weeks. 2)  Set up follow up with Rita Prom  if back pain does not resolve after taking the antibiotics. 3)  Schedule fasting lipids with the lab in next few weeks. 4)  Schedule CPP with [name] in 2 years. Prescriptions: CIPRO 500 MG  TABS (CIPROFLOXACIN HCL) Take 1 tablet by mouth two times a day  #14 x 0   Entered and Authorized by:   Tereso Newcomer PA-C   Signed by:   Tereso Newcomer PA-C on 09/27/2009   Method used:   Print then Give to Patient   RxID:   [id number]   Laboratory Results   Urine Tests    Routine Urinalysis   Glucose: negative   (Normal Range: Negative) Bilirubin: negative   (Normal Range: Negative) Ketone: trace (5)   (Normal Range: Negative) Spec. Gravity: 1.025   (Normal Range:  1.003-1.035) Blood: large   (Normal Range: Negative) pH: 5.5   (Normal Range: 5.0-8.0) Protein: negative   (Normal Range: Negative) Urobilinogen: 0.2   (Normal Range: 0-1) Nitrite: negative   (Normal Range: Negative) Leukocyte Esterace: large   (Normal Range: Negative)      Wet Mount Source: vaginal WBC/hpf: 1-5 Bacteria/hpf: rare Clue cells/hpf: none  Negative whiff Yeast/hpf: none Wet Mount KOH: Negative Trichomonas/hpf: none

## 2010-07-24 NOTE — Group Therapy Note (Signed)
NAMEJOREEN, Danielle [name]              ACCOUNT NO.:  [id number]   MEDICAL RECORD NO.:  [id number]          PATIENT TYPE:  WOC   LOCATION:  [facility]                   FACILITY:  [facility]   PHYSICIAN:  Johnella Moloney, MD        DATE OF BIRTH:  [birth year]   DATE OF SERVICE:  02/12/2007                                  CLINIC NOTE   CHIEF COMPLAINT:  Vaginal bleeding.   HISTORY OF PRESENT ILLNESS:  The patient is a 35 year old female status  post rapid hysterectomy secondary to bleeding of June 16, 2005.  Of note  the patient had supracervical hysterectomy at the time.  The patient  noted that she has been doing well up until two weeks ago when she noted  some bleeding per vagina.  She noticed a small amount of blood every  time she urinated, and she noted it on the toilet tissue that she used  to wipe.  She did not notice any underwear staining or any blood outside  of when she uses the bathroom and notices that after wiping.  The  patient also noted that she has had moderate lower abdominal pain and  lower back pain since this bleeding started.  She denies any fevers,  chills, sweats, nausea, vomiting, abnormal vaginal discharge or any  other concerns.  The patient denies having any vaginal bleeding since  her hysterectomy.  She had Pap smear two months ago __________  .  This  was normal according to the patient.  She denies any dysuria or other  symptoms.   PHYSICAL EXAMINATION:  VITAL SIGNS: Temperature 97.7, pulse 74, blood  pressure 112/71, weight 163.5 pounds.  GENERAL:  No apparent distress.  ABDOMEN:  Soft, moderate lower abdominal tenderness diffusely.  No  rebound or guarding.  BACK:  Some CVA tenderness elicited on the right side, also some lower  back tenderness on palpation.  PELVIC:  Normal external female genitalia.  No lesions observed on  speculum examination, pink, well-rugated vagina, normal cervix noted, no  bleeding nor any discharge noted from the cervix.  No blood or  any trace  of blood noted in the vagina, small amount of cervical motion tenderness  during the examination.  Adnexa unable to be palpated secondary to  habitus.   ASSESSMENT/PLAN:  The patient is a 35 year old woman status post  supracervical hysterectomy in April 2007 here with vaginal bleeding.  On  examination no vaginal bleeding observed.  Given her history the  differential includes bleeding from the genitourinary tract which could  be indicative of her urinary tract infections or kidney stones  especially given her costovertebral angle tenderness.  The patient could  also be having bleeding from her cervix given that she does have  endocervical glands that could undergo hormonal cycles and lead to some  shedding.  She could also have cervicitis which could be causing  bleeding.  Given this differential a gonorrhea, chlamydia culture was  obtained from the patient's cervix.  Will also send her urine for  urinalysis and culture to rule out any infection, and also schedule the  patient for a renal ultrasound  to rule out a kidney stone.  These plans  were discussed  with the patient who was agreeable to plan.  Patient will return in two  weeks for discussion of these results and further evaluation.           ______________________________  Johnella Moloney, MD     UD/MEDQ  D:  02/12/2007  T:  02/12/2007  Job:  [id number]

## 2010-07-27 NOTE — Op Note (Signed)
[name], [name]              ACCOUNT NO.:  [id number]   MEDICAL RECORD NO.:  [id number]          PATIENT TYPE:  WOC   LOCATION:  WOC                          FACILITY:  [facility]   PHYSICIAN:  Ginger Carne, MD  DATE OF BIRTH:  [birth year]   DATE OF PROCEDURE:  06/16/2005  DATE OF DISCHARGE:                                 OPERATIVE REPORT   PREOPERATIVE DIAGNOSIS:  6/10 biophysical profile, uninducible cervix and  previous cesarean section.   POSTOP DIAGNOSIS:  6/10 biophysical profile, uninducible cervix and previous  cesarean section. Term viable delivery of female infant.   PROCEDURE:  Repeat low transverse cesarean section.   SURGEON:  Ginger Carne, MD   ASSISTANT:  None.   COMPLICATIONS:  None immediate.   ESTIMATED BLOOD LOSS:  600 mL.   ANESTHESIA:  Spinal.   SPECIMEN:  Cord bloods and placenta.   OPERATIVE FINDINGS:  A term infant female delivered in a vertex presentation  on 06/16/2005.  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  Otherwise no  gross abnormalities.  The baby did not cry spontaneously at delivery and the  NICU staff was present for resuscitation and intubation.  Amniotic fluid was  clear.  Three-vessel cord central insertion.  Placenta complete.  Uterus,  tubes and ovaries showed normal decidual changes of pregnancy.   OPERATIVE PROCEDURE:  The patient prepped and draped in the usual fashion  and placed in the left lateral supine position.  Betadine solution used for  antiseptic.  The patient catheterized prior to procedure.  After adequate  spinal analgesia, a vertical incision was made on the abdomen and the  abdomen opened the old scar was removed from a previous vertical incision.  The lower uterine segment incised transversely after developing the bladder  flap.  Baby delivered, cord clamped, cut and infant handed to the pediatric  staff after bulb  suctioning.  Placenta removed manually.  Uterus inspected.  Closure of the uterine musculature in one layers of Vicryl running suture.  Bleeding  points hemostatically checked.  Blood clots removed.  Closure of the fascia  and parietal peritoneum in one layer of 0 Vicryl suture and skin staples for  the skin.  Instrument and sponge count were correct.  The patient tolerated  the procedure well.  Returned to post anesthesia recovery room in excellent  condition.      Ginger Carne, MD  Electronically Signed     SHB/MEDQ  D:  06/16/2005  T:  06/17/2005  Job:  [id number]

## 2010-07-27 NOTE — Discharge Summary (Signed)
Danielle [name], Danielle [name]              ACCOUNT NO.:  [id number]   MEDICAL RECORD NO.:  [id number]           PATIENT TYPE:   LOCATION:                                 FACILITY:   PHYSICIAN:  Angie B. Merlene Morse, MD       DATE OF BIRTH:   DATE OF ADMISSION:  06/16/2005  DATE OF DISCHARGE:  06/20/2005                                 DISCHARGE SUMMARY   ADMISSION DIAGNOSES:  1.  Term intrauterine pregnancy.  2.  Nonreactive NST.   DISCHARGE DIAGNOSES:  1.  Status post C-section.  2.  Status post hysterectomy.  3.  Postpartum hemorrhage.   HISTORY OF PRESENT ILLNESS:  This is a 35 year old G4, P2-0-1-2 at 40-4/7  weeks who was admitted complaining of decreased fetal movement with a  nonreactive NST who was found to have a BPP of 6/10 for no breathing seen.  The patient also had a fetus with a known encephalocele.   HOSPITAL COURSE:  The patient was admitted as her cervix was not favorable.  The patient underwent a C-section on April 8.  See op note for further  information.  The patient was found to be bleeding significantly postpartum  and had hypotension.  She was taken back.  It was difficult to palpate her  uterus.  She was taken back to the operating room.  Clotted blood was noted  in the peritoneal cavity.  The uterus was removed.  Please see op note for  more information.  The patient also had a previous C-section.  The patient  was transfused three units.  The patient remained stable postpartum.  The  patient complained of some low thorax pain on April 9 that radiated to her  shoulder.  She was found to have air under her abdomen on chest x-ray.  An  EKG done was normal as well.  The patient remained stable afterwards and  complained of no more shortness of breath or chest pain.  The patient did  have a hemoglobin of 6.8 on April 10 so the patient was transfused on April  11 and she was symptomatic with this.  She was transfused one unit.  The  patient continued to recover and was  ambulating well at the time of  discharge.   DISPOSITION:  The patient is discharged to home.   FOLLOW UP:  She is to follow up with the GYN clinic in two weeks.  She is to  call for appointment.  She is to return to the MAU on Sunday for staple  removal.   ACTIVITY:  The patient is to lift nothing heavier than the baby.   DISCHARGE MEDICATIONS:  1.  Ibuprofen 600 mg one p.o. q.6 h., #30.  2.  Colace 100 mg one p.o. b.i.d., #20.  3.  Ferrous sulfate 345 mg one p.o. b.i.d., #60 with one refill.  4.  Percocet 5 one p.o. q.4-6 h. p.r.n. pain, #20.           ______________________________  Karoline Caldwell B. Merlene Morse, MD     ABC/MEDQ  D:  06/20/2005  T:  06/20/2005  Job:  [id number]

## 2010-07-27 NOTE — Op Note (Signed)
NAMELEIRA, REGINO              ACCOUNT NO.:  [id number]   MEDICAL RECORD NO.:  [id number]          PATIENT TYPE:  WOC   LOCATION:  WOC                          FACILITY:  [facility]   PHYSICIAN:  Ginger Carne, MD  DATE OF BIRTH:  09-17-1975   DATE OF PROCEDURE:  06/16/2005  DATE OF DISCHARGE:                                 OPERATIVE REPORT   PREOPERATIVE DIAGNOSIS:  Post cesarean section intra-abdominal hemorrhage.   POSTOP DIAGNOSIS:  Post cesarean section intra-abdominal hemorrhage, post  cesarean section uterine rupture with atony and accumulated clots.   PROCEDURE:  Total abdominal hysterectomy with preservation of both tubes and  ovaries.   SURGEON:  Ginger Carne, MD   ASSISTANT:  Jeb Levering, C.N.M.   ESTIMATED BLOOD LOSS:  1500 mL including both preoperative and  intraoperative blood loss.   ANESTHESIA:  General.   SPECIMEN:  Uterus and cervix.   OPERATIVE FINDINGS:  Upon opening the abdomen, fresh clotted blood was noted  in the peritoneal cavity.  The uterus was 18 weeks in size significantly  boggy with accumulated clots and the left half of the lower uterine incision  was ruptured including the branches of the left uterine artery.  The ureters  were identified bilaterally, both visually and by palpation and were intact.  Both tubes and ovaries appeared to be normal.  The lower uterine segment on  the vaginal side was virtually paper thin at the time of closure during her  cesarean section and earlier part of the evening.  The lower half was  significantly ruptured and incapable of a safe and proper repair to assure  no further hemorrhage or active bleeding.   OPERATIVE PROCEDURE:  The patient prepped in the usual fashion and placed in  the supine position.  Betadine solution used for antiseptic.  The patient  had been previously catheterized.  After removing skin staples, abdomen  opened.  Blood clots were removed.  Self-retaining retractors  utilized.  Attention was directed to ensuring that the bladder had been taken down off  the cervix.  After identifying the source of active bleeding on the left  side, the ureters were carefully identified and the decision was made to  perform a hysterectomy.   The utero-ovarian ligaments were identified and clamped, cut and ligated  with 0 Vicryl suture.  The round ligaments similarly were clamped and  ligated with 0 Vicryl suture.  Anterior and posterior peritoneal flaps were  developed, dissected off uterus and in a standard Richardson fashion, the  uterine vasculature was clamped, cut and ligated with 0 Vicryl suture.  Afterwards, the cervix was identified and removed from the vagina proper,  the cuff closed with the 0 Vicryl running and interrupted sutures.  Bleeding  points hemostatically checked.  No active bleeding noted at end of the  procedure.  All blood clots removed.  Copious irrigation with removal of  irrigant followed.  Closure of the incision with PDS suture from either end  to midline including the fascia and portions of the peritoneum and skin  staples for the skin.  The patient tolerated  procedure well, returned to the  post anesthesia recovery room in excellent condition.  Three units of typed  and cross matched packed red blood cells were administered both  preoperatively and intraoperatively.  The urine clear at end of the  procedure.      Ginger Carne, MD  Electronically Signed     SHB/MEDQ  D:  06/16/2005  T:  06/17/2005  Job:  [id number]

## 2010-12-17 LAB — POCT URINALYSIS DIP (DEVICE)
Bilirubin Urine: NEGATIVE
Ketones, ur: NEGATIVE
Specific Gravity, Urine: 1.015
pH: 7

## 2012-09-21 ENCOUNTER — Ambulatory Visit: Payer: No Typology Code available for payment source | Attending: Family Medicine | Admitting: Internal Medicine

## 2012-09-21 VITALS — BP 119/95 | HR 70 | Temp 97.0°F | Resp 18 | Wt 169.6 lb

## 2012-09-21 DIAGNOSIS — M549 Dorsalgia, unspecified: Secondary | ICD-10-CM | POA: Insufficient documentation

## 2012-09-21 NOTE — Progress Notes (Signed)
Patient here to establish care Takes no medication  

## 2012-09-21 NOTE — Progress Notes (Signed)
Patient ID: [name], female   DOB: Nov 17, 1975, 37 y.o.   MRN: [id number] Patient Demographics  Danielle [name], is a 37 y.o. female  ELF:[id number]  [id number]  DOB - 04-06-1975  Chief Complaint  Patient presents with  . Annual Exam        Subjective:   Danielle [name] today is here to establish primary care. Patient has no significant past medical history. In the 2007, while pregnant patient required a total hysterectomy for postpartum hemorrhage. Currently patient has no complaints-with the exception of back pain that is chronic and unchanged. This has been going on for past 3-4 years. There is no history of fever, urinary or fecal incontinence. There is no history of any weakness in her lower extremities. Patient has also has No headache, No chest pain, No abdominal pain,No Nausea, No new weakness tingling or numbness, No Cough or SOB.   Objective:    Filed Vitals:   09/21/12 1200  BP: 119/95  Pulse: 70  Temp: 97 F (36.1 C)  Resp: 18  Weight: 169 lb 9.6 oz (76.93 kg)  SpO2: 99%     ALLERGIES:   Allergies  Allergen Reactions  . Ampicillin   . Penicillins     REACTION: rash    PAST MEDICAL HISTORY: History reviewed. No pertinent past medical history.  PAST SURGICAL HISTORY: Cesarean section and total hysterectomy  FAMILY HISTORY: History reviewed. No pertinent family history.  MEDICATIONS AT HOME: Prior to Admission medications   Not on File    SOCIAL HISTORY:   has no tobacco, alcohol, and drug history on file.  REVIEW OF SYSTEMS:  Constitutional:   No   Fevers, chills, fatigue.  HEENT:    No headaches, Sore throat,   Cardio-vascular: No chest pain,  Orthopnea, swelling in lower extremities, anasarca, palpitations  GI:  No abdominal pain, nausea, vomiting, diarrhea  Resp: No shortness of breath,  No coughing up of blood.No cough.No wheezing.  Skin:  no rash or lesions.  GU:  no dysuria, change in color of urine, no  urgency or frequency.  No flank pain.  Musculoskeletal: No joint pain or swelling.  No decreased range of motion.  Psych: No change in mood or affect. No depression or anxiety.  No memory loss.   Exam  General appearance :Awake, alert, not in any distress. Speech Clear. Not toxic Looking HEENT: Atraumatic and Normocephalic, pupils equally reactive to light and accomodation Neck: supple, no JVD. No cervical lymphadenopathy.  Chest:Good air entry bilaterally, no added sounds  CVS: S1 S2 regular, no murmurs.  Abdomen: Bowel sounds present, Non tender and not distended with no gaurding, rigidity or rebound. Extremities: B/L Lower Ext shows no edema, both legs are warm to touch Neurology: Awake alert, and oriented X 3, CN II-XII intact, Non focal Skin:No Rash Wounds:N/A    Data Review   CBC No results found for this basename: WBC, HGB, HCT, PLT, MCV, MCH, MCHC, RDW, NEUTRABS, LYMPHSABS, MONOABS, EOSABS, BASOSABS, BANDABS, BANDSABD,  in the last 168 hours  Chemistries   No results found for this basename: NA, K, CL, CO2, GLUCOSE, BUN, CREATININE, GFRCGP, CALCIUM, MG, AST, ALT, ALKPHOS, BILITOT,  in the last 168 hours ------------------------------------------------------------------------------------------------------------------ No results found for this basename: HGBA1C,  in the last 72 hours ------------------------------------------------------------------------------------------------------------------ No results found for this basename: CHOL, HDL, LDLCALC, TRIG, CHOLHDL, LDLDIRECT,  in the last 72 hours ------------------------------------------------------------------------------------------------------------------ No results found for this basename: TSH, T4TOTAL, FREET3, T3FREE, THYROIDAB,  in the last 72 hours ------------------------------------------------------------------------------------------------------------------  No results found for this basename: VITAMINB12,  FOLATE, FERRITIN, TIBC, IRON, RETICCTPCT,  in the last 72 hours  Coagulation profile  No results found for this basename: INR, PROTIME,  in the last 168 hours    Assessment & Plan   Back pain - This is chronic, continue with as needed nonsteroidal anti-inflammatories - Obtain x-ray of the back  Gen. Health maintenance - Status post total hysterectomy-? Need for Pap smear - Check routine labs  Return to clinic in one month-depending on lab results, will decide on further health maintenance needs.

## 2012-09-24 ENCOUNTER — Ambulatory Visit (HOSPITAL_COMMUNITY)
Admission: RE | Admit: 2012-09-24 | Discharge: 2012-09-24 | Disposition: A | Discharge status: Home / Self Care | Payer: No Typology Code available for payment source | Source: Ambulatory Visit | Attending: Internal Medicine | Admitting: Internal Medicine

## 2012-09-24 DIAGNOSIS — Z9089 Acquired absence of other organs: Secondary | ICD-10-CM | POA: Insufficient documentation

## 2012-09-24 DIAGNOSIS — M549 Dorsalgia, unspecified: Secondary | ICD-10-CM

## 2012-09-24 DIAGNOSIS — M545 Low back pain, unspecified: Secondary | ICD-10-CM | POA: Insufficient documentation

## 2012-10-12 ENCOUNTER — Ambulatory Visit: Payer: No Typology Code available for payment source | Attending: Family Medicine

## 2012-10-12 ENCOUNTER — Ambulatory Visit (HOSPITAL_COMMUNITY): Payer: No Typology Code available for payment source

## 2012-10-12 DIAGNOSIS — M549 Dorsalgia, unspecified: Secondary | ICD-10-CM | POA: Insufficient documentation

## 2012-10-12 LAB — TSH: TSH: 1.321 u[IU]/mL (ref 0.350–4.500)

## 2012-10-12 LAB — LIPID PANEL
HDL: 47 mg/dL (ref >39)
LDL Cholesterol: 103 mg/dL — ABNORMAL HIGH (ref 0–99)
Total CHOL/HDL Ratio: 3.5 Ratio
VLDL: 15 mg/dL (ref 0–40)

## 2012-10-12 LAB — CBC
MCH: 29.2 pg (ref 26.0–34.0)
MCHC: 34.4 g/dL (ref 30.0–36.0)
MCV: 84.9 fL (ref 78.0–100.0)
Platelets: 196 10*3/uL (ref 150–400)
RDW: 13.6 % (ref 11.5–15.5)

## 2012-10-12 LAB — COMPREHENSIVE METABOLIC PANEL
ALT: 35 U/L (ref 0–35)
AST: 20 U/L (ref 0–37)
Alkaline Phosphatase: 82 U/L (ref 39–117)
Creat: 0.63 mg/dL (ref 0.50–1.10)
Sodium: 139 mEq/L (ref 135–145)
Total Bilirubin: 0.5 mg/dL (ref 0.3–1.2)

## 2012-10-12 LAB — HEMOGLOBIN A1C: Hgb A1c MFr Bld: 5.4 % (ref <5.7)

## 2012-10-15 ENCOUNTER — Other Ambulatory Visit: Payer: No Typology Code available for payment source

## 2012-10-20 ENCOUNTER — Encounter: Payer: Self-pay | Admitting: Obstetrics & Gynecology

## 2012-10-22 ENCOUNTER — Ambulatory Visit: Payer: No Typology Code available for payment source

## 2012-12-07 ENCOUNTER — Encounter: Payer: No Typology Code available for payment source | Admitting: Obstetrics & Gynecology

## 2013-12-28 ENCOUNTER — Ambulatory Visit: Payer: Self-pay

## 2014-04-15 ENCOUNTER — Encounter (HOSPITAL_COMMUNITY): Payer: Self-pay | Admitting: Emergency Medicine

## 2014-05-18 ENCOUNTER — Ambulatory Visit: Payer: No Typology Code available for payment source | Attending: Internal Medicine | Admitting: Internal Medicine

## 2014-05-18 ENCOUNTER — Encounter: Payer: Self-pay | Admitting: Internal Medicine

## 2014-05-18 VITALS — BP 111/75 | HR 68 | Temp 97.7°F | Resp 16 | Ht 64.5 in | Wt 164.0 lb

## 2014-05-18 DIAGNOSIS — R5383 Other fatigue: Secondary | ICD-10-CM | POA: Insufficient documentation

## 2014-05-18 DIAGNOSIS — M545 Low back pain, unspecified: Secondary | ICD-10-CM

## 2014-05-18 LAB — CBC
HEMATOCRIT: 42.5 % (ref 36.0–46.0)
HEMOGLOBIN: 14.4 g/dL (ref 12.0–15.0)
MCH: 29.2 pg (ref 26.0–34.0)
MCHC: 33.9 g/dL (ref 30.0–36.0)
MCV: 86.2 fL (ref 78.0–100.0)
MPV: 12.1 fL (ref 8.6–12.4)
Platelets: 215 10*3/uL (ref 150–400)
RBC: 4.93 MIL/uL (ref 3.87–5.11)
RDW: 13.8 % (ref 11.5–15.5)
WBC: 6.1 10*3/uL (ref 4.0–10.5)

## 2014-05-18 LAB — COMPLETE METABOLIC PANEL WITH GFR
ALK PHOS: 97 U/L (ref 39–117)
ALT: 24 U/L (ref 0–35)
AST: 17 U/L (ref 0–37)
Albumin: 4.4 g/dL (ref 3.5–5.2)
BUN: 10 mg/dL (ref 6–23)
CHLORIDE: 101 meq/L (ref 96–112)
CO2: 25 mEq/L (ref 19–32)
CREATININE: 0.52 mg/dL (ref 0.50–1.10)
Calcium: 9.9 mg/dL (ref 8.4–10.5)
GLUCOSE: 77 mg/dL (ref 70–99)
POTASSIUM: 4.6 meq/L (ref 3.5–5.3)
Sodium: 140 mEq/L (ref 135–145)
Total Bilirubin: 0.6 mg/dL (ref 0.2–1.2)
Total Protein: 7.5 g/dL (ref 6.0–8.3)

## 2014-05-18 LAB — TSH: TSH: 1.775 u[IU]/mL (ref 0.350–4.500)

## 2014-05-18 MED ORDER — IBUPROFEN 800 MG PO TABS: 800.0000 mg | ORAL_TABLET | Freq: Two times a day (BID) | ORAL | Status: DC | PRN

## 2014-05-18 NOTE — Progress Notes (Signed)
Patient ID: Danielle [name], female   DOB: June 15, 1975, 39 y.o.   MRN: [id number]  DJM:[id number]  QIW:[id number]  DOB - May 04, 1975  CC:  Chief Complaint  Patient presents with  . Establish Care       HPI: Danielle [name] is a 39 y.o. female here today to establish medical care. She has no past medical history.  She c/o of lower back pain for the past three weeks. The pain is above her waist and is exacerbated by bending and twisting.  She denies any injuries. She does lift her daughter often.  The pain does not radiate. The pain is described as a sharp pain. She has tried ibuprofen 400 mg daily for pain.   She has had concerns of fatigue and sleepiness. She does not have a menstrual period since having vaginal parts removed. She states that she had hemorrhages 9 years ago and had to have surgery but is unsure what was removed. She has had a pap smear since then and was told to continue to have yearly pap smears.      Patient has No headache, No chest pain, No abdominal pain - No Nausea, No new weakness tingling or numbness, No Cough - SOB.  Allergies  Allergen Reactions  . Ampicillin   . Penicillins     REACTION: rash   History reviewed. No pertinent past medical history. No current outpatient prescriptions on file prior to visit.   No current facility-administered medications on file prior to visit.   History reviewed. No pertinent family history. History   Social History  . Marital Status: Married    Spouse Name: N/A  . Number of Children: N/A  . Years of Education: N/A   Occupational History  . Not on file.   Social History Main Topics  . Smoking status: Never Smoker   . Smokeless tobacco: Not on file  . Alcohol Use: No  . Drug Use: No  . Sexual Activity: Not on file   Other Topics Concern  . Not on file   Social History Narrative    Review of Systems: See HPI    Objective:   Filed Vitals:   05/18/14 1205  BP: 111/75  Pulse: 68  Temp: 97.7 F  (36.5 C)  Resp: 16    Physical Exam: Constitutional: Patient appears well-developed and well-nourished. No distress. HENT: Normocephalic, atraumatic, External right and left ear normal. Oropharynx is clear and moist.  Eyes: Conjunctivae and EOM are normal. PERRLA, no scleral icterus. Neck: Normal ROM. Neck supple. No JVD. No tracheal deviation. No thyromegaly. CVS: RRR, S1/S2 +, no murmurs, no gallops, no carotid bruit.  Pulmonary: Effort and breath sounds normal, no stridor, rhonchi, wheezes, rales.  Abdominal: Soft. BS +, no distension, tenderness, rebound or guarding.  Musculoskeletal: Normal range of motion. No edema and no tenderness.  Neuro: Alert. Normal reflexes, muscle tone coordination. No cranial nerve deficit. Skin: Skin is warm and dry. No rash noted. Not diaphoretic. No erythema. No pallor. Psychiatric: Normal mood and affect. Behavior, judgment, thought content normal.  Lab Results  Component Value Date   WBC 5.6 10/12/2012   HGB 14.5 10/12/2012   HCT 42.2 10/12/2012   MCV 84.9 10/12/2012   PLT 196 10/12/2012   Lab Results  Component Value Date   CREATININE 0.63 10/12/2012   BUN 11 10/12/2012   NA 139 10/12/2012   K 4.4 10/12/2012   CL 106 10/12/2012   CO2 26 10/12/2012    Lab Results  Component  Value Date   HGBA1C 5.4 10/12/2012   Lipid Panel     Component Value Date/Time   CHOL 165 10/12/2012 0926   TRIG 76 10/12/2012 0926   HDL 47 10/12/2012 0926   CHOLHDL 3.5 10/12/2012 0926   VLDL 15 10/12/2012 0926   LDLCALC 103* 10/12/2012 0926       Assessment and plan:   Dyneisha was seen today for establish care.  Diagnoses and all orders for this visit:  Other fatigue Orders: -     CBC -     COMPLETE METABOLIC PANEL WITH GFR -     TSH -     Vitamin D, 25-hydroxy -     Hemoglobin A1c  Bilateral low back pain without sciatica -     ibuprofen (ADVIL,MOTRIN) 800 MG tablet; Take 1 tablet (800 mg total) by mouth 2 (two) times daily as  needed. Patient may try heating pad as well  Return if symptoms worsen or fail to improve.  The patient was given clear instructions to go to ER or return to medical center if symptoms don't improve, worsen or new problems develop. The patient verbalized understanding. The patient was told to call to get lab results if they haven't heard anything in the next week.     Chari Manning, NP-C [facility] [phone] 05/18/2014, 12:34 PM

## 2014-05-18 NOTE — Progress Notes (Signed)
Pt is here to establish care. Pt is here c/o lower back pain for about 3 weeks.  She states that she is tired and feels very sleepy.

## 2014-05-18 NOTE — Patient Instructions (Signed)
Dolor de espalda en el adulto °(Back Pain, Adult) ° El dolor de cintura es frecuente. Aproximadamente 1 de cada 5 personas lo sufren. La causa rara vez pone en peligro la vida. Con frecuencia mejora luego de algún tiempo. Alrededor de la mitad de las personas que sufren un inicio súbito de dolor de cintura, se sentirán mejor luego de 2 semanas. Aproximadamente 8 de cada 10 se sentirán mejor luego de 6 semanas.  °CAUSAS  °Algunas causas comunes son:  °· Distensión de los músculos o ligamentos que sostienen la columna vertebral. °· Desgaste (degeneración) de los discos vertebrales. °· Artritis. °· Traumatismos directos en la espalda. °DIAGNÓSTICO  °La mayor parte de las veces, la causa directa no se conoce. Sin embargo, el dolor puede tratarse efectivamente aún cuando no se conozca la causa. Una de las formas más precisas de asegurar que la causa del dolor no constituye un peligro es responder a las preguntas del médico acerca de su salud y sus síntomas. Si el médico necesita más información, podrá indicar análisis de laboratorio o realizar un diagnóstico por imágenes (radiografías o resonancia magnética). Sin embargo, aunque las imágenes muestren modificaciones, generalmente no es necesaria la cirugía.  °INSTRUCCIONES PARA EL CUIDADO EN EL HOGAR  °En algunas personas, el dolor de espalda vuelve. Como rara vez es peligroso, los pacientes pueden aprender a manejarlo ellos mismos.  °· Manténgase activo. Si permanece sentado o de pie mucho tiempo en el mismo lugar, se tensiona la espalda.  No se siente, maneje ni se quede parado en un mismo lugar por más de 30 minutos. Realice caminatas cortas en superficies planas ni bien el dolor haya cedido. Trate de aumentar cada día el tiempo que camina . °· No se quede en la cama. Si hace reposo durante más de 1 o 2 días, puede retrasar la recuperación. °· No evite los ejercicios ni el trabajo. El cuerpo está hecho para moverse. No es peligroso estar activo, aunque le duela la  espalda. La espalda se curará más rápido si continúa sus actividades antes de que el dolor se vaya. °· Preste atención a su cuerpo cuando se incline y se levante. Muchas personas sienten menos molestias cuando levantan objetos si doblan las rodillas, mantienen la carga cerca del cuerpo y evitan torcerse. Generalmente, las posiciones más cómodas son las que ejercen menos tensión en la espalda en recuperación. °· Encuentre una posición cómoda para dormir. Utilice un colchón firme y recuéstese de costado. Doble ligeramente sus rodillas. Si se recuesta sobre su espalda, coloque una almohada debajo de sus rodillas. °· Tome sólo medicamentos de venta libre o recetados, según las indicaciones del médico. Los medicamentos de venta libre para calmar el dolor y reducir la inflamación, son los que en general más ayudan. El médico podrá prescribirle relajantes musculares. Estos medicamentos calman el dolor de modo que pueda retornar a sus actividades normales y a realizar ejercicios saludables. °· Aplique hielo sobre la zona lesionada. °¨ Ponga el hielo en una bolsa plástica. °¨ Colóquese una toalla entre la piel y la bolsa de hielo. °¨ Deje la bolsa de hielo durante 15 a 20 minutos 3 a 4 veces por día, durante los primeros 2 ó 3 días. Luego podrá alternar entre calor y hielo para reducir el dolor y los espasmos. °· Consulte a su médico si puede tratar de hacer ejercicios para la espalda y recibir un masaje suave. Pueden ser beneficiosos. °· Evite sentirse ansioso o estresado. El estrés aumenta la tensión muscular y puede empeorar el dolor de espalda. Es importante reconocer cuando está ansioso o estresado y aprender la forma   de controlarlos. El ejercicio es una gran opción. °SOLICITE ATENCIÓN MÉDICA SI:  °· Siente un dolor que no se alivia con reposo o medicamentos. °· El dolor no mejora en 1 semana. °· Desarrolla nuevos síntomas. °· No se siente bien en general. °SOLICITE ATENCIÓN MÉDICA DE INMEDIATO SI: °· Siente un dolor  que se irradia desde la espalda hacia sus piernas. °· Desarrolla nuevos problemas en el intestino o la vejiga. °· Siente debilidad o adormecimiento inusual en sus brazos o piernas. °· Presenta náuseas o vómitos. °· Presenta dolor abdominal. °· Se siente desfalleciente. °Document Released: 02/25/2005 Document Revised: 08/27/2011 °ExitCare® Patient Information ©2015 [employer]. This information is not intended to replace advice given to you by your health care provider. Make sure you discuss any questions you have with your health care provider. ° °

## 2014-05-19 LAB — HEMOGLOBIN A1C
Hgb A1c MFr Bld: 5.6 % (ref <5.7)
Mean Plasma Glucose: 114 mg/dL (ref <117)

## 2014-05-19 LAB — VITAMIN D 25 HYDROXY (VIT D DEFICIENCY, FRACTURES): VIT D 25 HYDROXY: 15 ng/mL — AB (ref 30–100)

## 2014-05-27 ENCOUNTER — Telehealth: Payer: Self-pay | Admitting: *Deleted

## 2014-05-27 MED ORDER — ERGOCALCIFEROL 1.25 MG (50000 UT) PO CAPS: 50000.0000 [IU] | ORAL_CAPSULE | ORAL | Status: DC

## 2014-05-27 NOTE — Telephone Encounter (Signed)
Pt is aware of her lab results.  

## 2014-05-27 NOTE — Telephone Encounter (Signed)
-----   Message from Lance Bosch, NP sent at 05/23/2014  7:22 PM EDT ----- Vitamin D is low. Please send drisdol 50,000 IU to take once weekly for 12 weeks. 12 tablets no refills. All the labs are normal

## 2014-09-15 ENCOUNTER — Ambulatory Visit: Payer: No Typology Code available for payment source | Attending: Internal Medicine | Admitting: Internal Medicine

## 2014-09-15 ENCOUNTER — Encounter: Payer: Self-pay | Admitting: Internal Medicine

## 2014-09-15 VITALS — BP 113/77 | HR 62 | Temp 98.0°F | Ht 65.0 in | Wt 166.0 lb

## 2014-09-15 DIAGNOSIS — M549 Dorsalgia, unspecified: Secondary | ICD-10-CM | POA: Insufficient documentation

## 2014-09-15 DIAGNOSIS — M545 Low back pain, unspecified: Secondary | ICD-10-CM

## 2014-09-15 MED ORDER — IBUPROFEN 800 MG PO TABS: 800.0000 mg | ORAL_TABLET | Freq: Two times a day (BID) | ORAL | Status: DC | PRN

## 2014-09-15 MED ORDER — CYCLOBENZAPRINE HCL 5 MG PO TABS: 5.0000 mg | ORAL_TABLET | Freq: Three times a day (TID) | ORAL | Status: DC | PRN

## 2014-09-15 NOTE — Patient Instructions (Signed)

## 2014-09-15 NOTE — Progress Notes (Signed)
Patient here to follow up on her lower back pain.  The pain does not radiate down her legs and the ibuprofen does help when the pain is mild.  She needs a refill. No other complaints

## 2014-09-15 NOTE — Progress Notes (Signed)
Patient ID: Danielle [name], female   DOB: 1975-10-05, 39 y.o.   MRN: [id number]  CC: back pain  HPI: Danielle [name] is a 39 y.o. female here today for a follow up visit.  Patient has no past medical history. She c/o of lower back pain for the past three weeks. The pain is above her waist and is exacerbated by bending and twisting. She denies any injuries. She does lift her daughter often. The pain does not radiate. The pain is described as a sharp pain. She has tried ibuprofen 800 mg twice daily for pain. Patient reports that she has not had any changes in pain character. She denies bowel or bladder dysfunction.   Patient has No headache, No chest pain, No abdominal pain - No Nausea, No new weakness tingling or numbness, No Cough - SOB.  Allergies  Allergen Reactions  . Ampicillin   . Penicillins     REACTION: rash   History reviewed. No pertinent past medical history. Current Outpatient Prescriptions on File Prior to Visit  Medication Sig Dispense Refill  . ibuprofen (ADVIL,MOTRIN) 800 MG tablet Take 1 tablet (800 mg total) by mouth 2 (two) times daily as needed. 60 tablet 0  . ergocalciferol (VITAMIN D2) 50000 UNITS capsule Take 1 capsule (50,000 Units total) by mouth once a week. (Patient not taking: Reported on 09/15/2014) 12 capsule 0   No current facility-administered medications on file prior to visit.   History reviewed. No pertinent family history. History   Social History  . Marital Status: Married    Spouse Name: N/A  . Number of Children: N/A  . Years of Education: N/A   Occupational History  . Not on file.   Social History Main Topics  . Smoking status: Never Smoker   . Smokeless tobacco: Not on file  . Alcohol Use: No  . Drug Use: No  . Sexual Activity: Not on file   Other Topics Concern  . Not on file   Social History Narrative    Review of Systems: See HPI.    Objective:   Filed Vitals:   09/15/14 0906  BP: 113/77  Pulse: 62  Temp: 98  F (36.7 C)    Physical Exam  Constitutional: She is oriented to person, place, and time.  Cardiovascular: Normal rate, regular rhythm and normal heart sounds.   Pulmonary/Chest: Effort normal and breath sounds normal.  Musculoskeletal: She exhibits no tenderness.  Neurological: She is alert and oriented to person, place, and time.  Skin: Skin is warm and dry.  Psychiatric: She has a normal mood and affect.   Lab Results  Component Value Date   WBC 6.1 05/18/2014   HGB 14.4 05/18/2014   HCT 42.5 05/18/2014   MCV 86.2 05/18/2014   PLT 215 05/18/2014   Lab Results  Component Value Date   CREATININE 0.52 05/18/2014   BUN 10 05/18/2014   NA 140 05/18/2014   K 4.6 05/18/2014   CL 101 05/18/2014   CO2 25 05/18/2014    Lab Results  Component Value Date   HGBA1C 5.6 05/18/2014   Lipid Panel     Component Value Date/Time   CHOL 165 10/12/2012 0926   TRIG 76 10/12/2012 0926   HDL 47 10/12/2012 0926   CHOLHDL 3.5 10/12/2012 0926   VLDL 15 10/12/2012 0926   LDLCALC 103* 10/12/2012 0926       Assessment and plan:   Danielle [name] was seen today for back pain.  Diagnoses and all orders  for this visit:  Bilateral low back pain without sciatica Orders: -     Refill ibuprofen (ADVIL,MOTRIN) 800 MG tablet; Take 1 tablet (800 mg total) by mouth 2 (two) times daily as needed. -     Ambulatory referral to Orthopedic Surgery -     Begin cyclobenzaprine (FLEXERIL) 5 MG tablet; Take 1 tablet (5 mg total) by mouth 3 (three) times daily as needed for muscle spasms.  Due to language barrier, an interpreter was present during the history-taking and subsequent discussion (and for part of the physical exam) with this patient.  Return if symptoms worsen or fail to improve.       Lance Bosch, [place] [facility] [facility] [phone] 09/15/2014, 9:16 AM

## 2014-10-28 ENCOUNTER — Emergency Department (HOSPITAL_COMMUNITY)
Admission: EM | Admit: 2014-10-28 | Discharge: 2014-10-28 | Disposition: A | Discharge status: Home / Self Care | Payer: Self-pay | Source: Home / Self Care | Attending: Family Medicine | Admitting: Family Medicine

## 2014-10-28 ENCOUNTER — Encounter (HOSPITAL_COMMUNITY): Payer: Self-pay | Admitting: Emergency Medicine

## 2014-10-28 DIAGNOSIS — S161XXA Strain of muscle, fascia and tendon at neck level, initial encounter: Secondary | ICD-10-CM

## 2014-10-28 MED ORDER — METAXALONE 800 MG PO TABS: 800.0000 mg | ORAL_TABLET | Freq: Three times a day (TID) | ORAL | Status: DC

## 2014-10-28 NOTE — ED Provider Notes (Signed)
CSN: [id number]     Arrival date & time 10/28/14  1415 History   First MD Initiated Contact with Patient 10/28/14 1431     Chief Complaint  Patient presents with  . Neck Pain   (Consider location/radiation/quality/duration/timing/severity/associated sxs/prior Treatment) Patient is a 39 y.o. female presenting with neck injury. The history is provided by the patient.  Neck Injury This is a new problem. The current episode started more than 2 days ago. The problem has been gradually worsening. Pertinent negatives include no chest pain and no abdominal pain. Associated symptoms comments: Onset after sleep has gone from left to right side, no neuro sx, no fever, is sore with rom. No adenopathy..    Past Medical History  Diagnosis Date  . Back pain    Past Surgical History  Procedure Laterality Date  . Cesarean section    . Abdominal hysterectomy     No family history on file. Social History  Substance Use Topics  . Smoking status: Never Smoker   . Smokeless tobacco: None  . Alcohol Use: No   OB History    No data available     Review of Systems  Constitutional: Negative.   Cardiovascular: Negative for chest pain.  Gastrointestinal: Negative.  Negative for abdominal pain.  Musculoskeletal: Positive for neck pain. Negative for neck stiffness.    Allergies  Ampicillin and Penicillins  Home Medications   Prior to Admission medications   Medication Sig Start Date End Date Taking? Authorizing Provider  cyclobenzaprine (FLEXERIL) 5 MG tablet Take 1 tablet (5 mg total) by mouth 3 (three) times daily as needed for muscle spasms. 09/15/14   Lance Bosch, NP  ergocalciferol (VITAMIN D2) 50000 UNITS capsule Take 1 capsule (50,000 Units total) by mouth once a week. Patient not taking: Reported on 09/15/2014 05/27/14   Lance Bosch, NP  ibuprofen (ADVIL,MOTRIN) 800 MG tablet Take 1 tablet (800 mg total) by mouth 2 (two) times daily as needed. 09/15/14   Lance Bosch, NP  metaxalone  (SKELAXIN) 800 MG tablet Take 1 tablet (800 mg total) by mouth 3 (three) times daily. As muscle relaxer. 10/28/14   Billy Fischer, MD   There were no vitals taken for this visit. Physical Exam  Constitutional: She appears well-developed and well-nourished. No distress.  Neck: Trachea normal and normal range of motion. Neck supple. Muscular tenderness present. No spinous process tenderness present. No rigidity. Normal range of motion present.  Lymphadenopathy:    She has no cervical adenopathy.  Nursing note and vitals reviewed.   ED Course  Procedures (including critical care time) Labs Review Labs Reviewed - No data to display  Imaging Review No results found.   MDM   1. Cervical strain, acute, initial encounter        Billy Fischer, MD 10/28/14 1505

## 2014-10-28 NOTE — ED Notes (Signed)
Patient reports 3 day history of neck neck/shoulder discomfort.  The first day, discomfort rand own right side of neck, now pain is along left side of neck.  No known injury.  No numbness, no tingling, no headache.  [facility] for [facility] back pain.

## 2014-11-04 ENCOUNTER — Emergency Department (HOSPITAL_COMMUNITY): Payer: Self-pay

## 2014-11-04 ENCOUNTER — Emergency Department (HOSPITAL_COMMUNITY)
Admission: EM | Admit: 2014-11-04 | Discharge: 2014-11-04 | Disposition: A | Discharge status: Home / Self Care | Payer: Self-pay | Attending: Physician Assistant | Admitting: Physician Assistant

## 2014-11-04 ENCOUNTER — Emergency Department (HOSPITAL_COMMUNITY): Payer: No Typology Code available for payment source

## 2014-11-04 ENCOUNTER — Encounter (HOSPITAL_COMMUNITY): Payer: Self-pay | Admitting: Emergency Medicine

## 2014-11-04 DIAGNOSIS — R000 Tachycardia, unspecified: Secondary | ICD-10-CM | POA: Insufficient documentation

## 2014-11-04 DIAGNOSIS — Z880 Allergy status to penicillin: Secondary | ICD-10-CM | POA: Insufficient documentation

## 2014-11-04 DIAGNOSIS — R0602 Shortness of breath: Secondary | ICD-10-CM | POA: Insufficient documentation

## 2014-11-04 DIAGNOSIS — Y9389 Activity, other specified: Secondary | ICD-10-CM | POA: Insufficient documentation

## 2014-11-04 DIAGNOSIS — S301XXA Contusion of abdominal wall, initial encounter: Secondary | ICD-10-CM | POA: Insufficient documentation

## 2014-11-04 DIAGNOSIS — S20212A Contusion of left front wall of thorax, initial encounter: Secondary | ICD-10-CM | POA: Insufficient documentation

## 2014-11-04 DIAGNOSIS — Y999 Unspecified external cause status: Secondary | ICD-10-CM | POA: Insufficient documentation

## 2014-11-04 DIAGNOSIS — Y92410 Unspecified street and highway as the place of occurrence of the external cause: Secondary | ICD-10-CM | POA: Insufficient documentation

## 2014-11-04 DIAGNOSIS — Z79899 Other long term (current) drug therapy: Secondary | ICD-10-CM | POA: Insufficient documentation

## 2014-11-04 DIAGNOSIS — T1490XA Injury, unspecified, initial encounter: Secondary | ICD-10-CM

## 2014-11-04 DIAGNOSIS — R0789 Other chest pain: Secondary | ICD-10-CM

## 2014-11-04 LAB — I-STAT CHEM 8, ED
BUN: 13 mg/dL (ref 6–20)
CALCIUM ION: 1.13 mmol/L (ref 1.12–1.23)
CREATININE: 0.7 mg/dL (ref 0.44–1.00)
Chloride: 104 mmol/L (ref 101–111)
Glucose, Bld: 106 mg/dL — ABNORMAL HIGH (ref 65–99)
HEMATOCRIT: 45 % (ref 36.0–46.0)
HEMOGLOBIN: 15.3 g/dL — AB (ref 12.0–15.0)
Potassium: 3.3 mmol/L — ABNORMAL LOW (ref 3.5–5.1)
Sodium: 140 mmol/L (ref 135–145)
TCO2: 20 mmol/L (ref 0–100)

## 2014-11-04 LAB — CBC WITH DIFFERENTIAL/PLATELET
Basophils Absolute: 0 10*3/uL (ref 0.0–0.1)
Basophils Relative: 0 % (ref 0–1)
Eosinophils Absolute: 0.1 10*3/uL (ref 0.0–0.7)
Eosinophils Relative: 1 % (ref 0–5)
HCT: 40.2 % (ref 36.0–46.0)
HEMOGLOBIN: 13.9 g/dL (ref 12.0–15.0)
LYMPHS ABS: 3.7 10*3/uL (ref 0.7–4.0)
LYMPHS PCT: 40 % (ref 12–46)
MCH: 29.6 pg (ref 26.0–34.0)
MCHC: 34.6 g/dL (ref 30.0–36.0)
MCV: 85.5 fL (ref 78.0–100.0)
Monocytes Absolute: 0.4 10*3/uL (ref 0.1–1.0)
Monocytes Relative: 5 % (ref 3–12)
NEUTROS ABS: 4.9 10*3/uL (ref 1.7–7.7)
NEUTROS PCT: 54 % (ref 43–77)
Platelets: 176 10*3/uL (ref 150–400)
RBC: 4.7 MIL/uL (ref 3.87–5.11)
RDW: 12.6 % (ref 11.5–15.5)
WBC: 9.2 10*3/uL (ref 4.0–10.5)

## 2014-11-04 LAB — COMPREHENSIVE METABOLIC PANEL
ALK PHOS: 80 U/L (ref 38–126)
ALT: 24 U/L (ref 14–54)
AST: 23 U/L (ref 15–41)
Albumin: 4.3 g/dL (ref 3.5–5.0)
Anion gap: 11 (ref 5–15)
BUN: 11 mg/dL (ref 6–20)
CALCIUM: 9.3 mg/dL (ref 8.9–10.3)
CO2: 22 mmol/L (ref 22–32)
CREATININE: 0.76 mg/dL (ref 0.44–1.00)
Chloride: 103 mmol/L (ref 101–111)
Glucose, Bld: 113 mg/dL — ABNORMAL HIGH (ref 65–99)
Potassium: 3.4 mmol/L — ABNORMAL LOW (ref 3.5–5.1)
Sodium: 136 mmol/L (ref 135–145)
Total Bilirubin: 0.8 mg/dL (ref 0.3–1.2)
Total Protein: 7.4 g/dL (ref 6.5–8.1)

## 2014-11-04 MED ORDER — OXYCODONE-ACETAMINOPHEN 5-325 MG PO TABS: 2.0000 | ORAL_TABLET | Freq: Four times a day (QID) | ORAL | Status: DC | PRN

## 2014-11-04 MED ORDER — ONDANSETRON 4 MG PO TBDP
8.0000 mg | ORAL_TABLET | Freq: Once | ORAL | Status: AC
  Administered 2014-11-04: 8 mg via ORAL
  Filled 2014-11-04: qty 2

## 2014-11-04 MED ORDER — FENTANYL CITRATE (PF) 100 MCG/2ML IJ SOLN
50.0000 ug | Freq: Once | INTRAMUSCULAR | Status: AC
  Administered 2014-11-04: 50 ug via INTRAVENOUS
  Filled 2014-11-04: qty 2

## 2014-11-04 MED ORDER — HYDROMORPHONE HCL 1 MG/ML IJ SOLN
1.0000 mg | Freq: Once | INTRAMUSCULAR | Status: AC
  Administered 2014-11-04: 1 mg via INTRAVENOUS
  Filled 2014-11-04: qty 1

## 2014-11-04 MED ORDER — IOHEXOL 300 MG/ML  SOLN
100.0000 mL | Freq: Once | INTRAMUSCULAR | Status: AC | PRN
  Administered 2014-11-04: 100 mL via INTRAVENOUS
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
## 2014-11-04 NOTE — ED Notes (Signed)
Patient transported to CT 

## 2014-11-04 NOTE — ED Notes (Signed)
Ambulated pt in the room pt ambulated well pt still complains of being sore in her chest and feeling nauseous no other complaints noted at this time
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
## 2014-11-04 NOTE — Discharge Instructions (Signed)
Traumatismo contuso (Blunt Trauma) Usted ha sido evaluado por sus lesiones. Fue examinado y Writer que lo asiste no Estate manager/land agent lesiones lo suficientemente graves como para que requiera hospitalizacin. Luego de un accidente, es comn presentar mltiples magullones y dolores musculares. Estos tienden a Production designer, theatre/television/film las primeras 24 horas, con ms rigidez y Radiation protection practitioner las horas siguientes, que empeorarn cuando se levante de la cama la primera maana luego del accidente. A partir de all, debera comenzar a Charity fundraiser que pase. El nivel de mejora generalmente depende de la importancia de las lesiones sufridas en el accidente. Luego del accidente, si alguna parte de su cuerpo no responde como debera, o si el dolor en alguna zona se incrementa, debe regresar al [employer] para que lo examinen nuevamente.  INSTRUCCIONES PARA EL CUIDADO DOMICILIARIO Los cuidados de rutina para las zonas doloridas deben incluir:  [employer] en las zonas doloridas cada 2 horas durante 20 minutos mientras est despierto durante los prximos 2 das.  Beber lquidos en abundancia (excluya el alcohol).  Darse una ducha caliente o tibia o tomar un bao una o dos veces por da para aumentar el flujo sanguneo en los msculos doloridos. Esto lo ayudar a Nurse, learning disability.  Actividades segn las tolere. Levantar pesos Chief Operating Officer de cuello o espalda.  Utilice los medicamentos de venta libre o de prescripcin para Conservation officer, historic buildings, Health and safety inspector o la [place], segn se lo indique el profesional que lo asiste. No tome aspirina. Podran aumentar los hematomas o las hemorragias en caso de que existan pequeas zonas en las que esto ocurre. [place] DE INMEDIATO SI OBSERVA:  Entumecimiento, hormigueo, debilidad o problemas con el uso de los brazos o las piernas.  Dolor de cabeza intenso que no mejora con medicamentos.  Cambios en el control del intestino o la vejiga.  Aumento del dolor  en otras partes del cuerpo.  Dificultades respiratorias, mareos.  Nuseas, vmitos o sudoracin.  Aumento del malestar abdominal (en el vientre).  Sangre en la orina, en [place].  Dolor en los hombros en el rea donde se ubicaran los tirantes de [place] prenda.  Sensacin de [employer] o si ha sufrido un episodio de [place]. En algunos casos no es posible identificar todas las lesiones inmediatamente despus del traumatismo. Es importante que siga controlando su enfermedad despus de la visita al servicio de Multimedia programmer. Si siente que no mejora, o mejora ms lentamente que lo que debera esperarse, llame a su mdico. Si siente que sus sntomas (problemas) empeoran, vuelva al Servicio de Emergencias inmediatamente. Document Released: 02/25/2005 Document Revised: 05/20/2011 [facility] Patient Information 2015 [place]. This information is not intended to replace advice given to you by your health care provider. Make sure you discuss any questions you have with your health care provider.
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
## 2014-11-04 NOTE — ED Notes (Signed)
Patient vomiting at this time.

## 2014-11-04 NOTE — Progress Notes (Signed)
Chaplain was called to help patient's daughter, who was crying in the hall outside the CT room.  She was relieved when her mom was finished and taken back to room.   Chaplain will follow.  Rev. [place], [place]

## 2014-11-12 ENCOUNTER — Emergency Department (INDEPENDENT_AMBULATORY_CARE_PROVIDER_SITE_OTHER)
Admission: EM | Admit: 2014-11-12 | Discharge: 2014-11-12 | Disposition: A | Discharge status: Home / Self Care | Payer: No Typology Code available for payment source | Source: Home / Self Care | Attending: Family Medicine | Admitting: Family Medicine

## 2014-11-12 ENCOUNTER — Encounter (HOSPITAL_COMMUNITY): Payer: Self-pay | Admitting: Emergency Medicine

## 2014-11-12 DIAGNOSIS — M791 Myalgia, unspecified site: Secondary | ICD-10-CM

## 2014-11-12 DIAGNOSIS — M533 Sacrococcygeal disorders, not elsewhere classified: Secondary | ICD-10-CM

## 2014-11-12 DIAGNOSIS — R0789 Other chest pain: Secondary | ICD-10-CM

## 2014-11-12 DIAGNOSIS — R109 Unspecified abdominal pain: Secondary | ICD-10-CM

## 2014-11-12 DIAGNOSIS — Z041 Encounter for examination and observation following transport accident: Secondary | ICD-10-CM

## 2014-11-12 DIAGNOSIS — S20212D Contusion of left front wall of thorax, subsequent encounter: Secondary | ICD-10-CM

## 2014-11-12 DIAGNOSIS — Z043 Encounter for examination and observation following other accident: Secondary | ICD-10-CM

## 2014-11-12 MED ORDER — OXYCODONE-ACETAMINOPHEN 5-325 MG PO TABS: 1.0000 | ORAL_TABLET | ORAL | Status: DC | PRN

## 2014-11-12 NOTE — Discharge Instructions (Signed)
Dolor abdominal (Abdominal Pain) El dolor puede tener muchas causas. Normalmente la causa del dolor abdominal no es una enfermedad y Teacher, English as a foreign language sin Clinical research associate. Frecuentemente puede controlarse y tratarse en casa. Su mdico le Chartered certified accountant examen fsico y posiblemente solicite anlisis de sangre y radiografas para ayudar a Teacher, adult education la gravedad de su dolor. Sin embargo, en [employer], debe transcurrir ms tiempo antes de que se pueda Pension scheme manager una causa evidente del dolor. Antes de llegar a ese punto, es posible que su mdico no sepa si necesita ms pruebas o un tratamiento ms profundo. INSTRUCCIONES PARA EL CUIDADO EN EL HOGAR  Est atento al dolor para ver si hay cambios. Las siguientes indicaciones ayudarn a Chief Strategy Officer que pueda sentir:  [place] solo medicamentos de venta libre o recetados, segn las indicaciones del mdico.  No tome laxantes a menos que se lo haya indicado su mdico.  Pruebe con Ardelia Mems dieta lquida absoluta (caldo, t o agua) segn se lo indique su mdico. Introduzca gradualmente una dieta normal, segn su tolerancia. SOLICITE ATENCIN MDICA SI:  Tiene dolor abdominal sin explicacin.  Tiene dolor abdominal relacionado con nuseas o diarrea.  Tiene dolor cuando orina o defeca.  Experimenta dolor abdominal que lo despierta de noche.  Tiene dolor abdominal que empeora o mejora cuando come alimentos.  Tiene dolor abdominal que empeora cuando come alimentos grasosos.  Tiene fiebre. SOLICITE ATENCIN MDICA DE INMEDIATO SI:   El dolor no desaparece en un plazo mximo de 2horas.  No deja de (vomitar).  El Social research officer, government se siente solo en partes del abdomen, como el lado derecho o la parte inferior izquierda del abdomen.  Evaca materia fecal sanguinolenta o negra, de aspecto alquitranado. ASEGRESE DE QUE:  Comprende estas instrucciones.  Controlar su afeccin.  Recibir ayuda de inmediato si no mejora o si empeora. Document Released: 02/25/2005  Document Revised: 03/02/2013 [facility] Patient Information 2015 [place]. This information is not intended to replace advice given to you by your health care provider. Make sure you discuss any questions you have with your health care provider.  Dolor de la pared torcica  (Chest Wall Pain)  El dolor en la pared torcica se siente en la zona de los huesos y msculos del trax. Podrn pasar hasta 6 semanas hasta que comience a mejorar. Podra pasar ms tiempo si es Customer service manager. Puede aparecer sin motivo. Otras veces, algunos factores como grmenes, lesiones, tos o ejercicios pueden Multimedia programmer. CUIDADOS EN EL HOGAR   Evite las actividades que lo cansen o le causen Social research officer, government. Trate de no usar los msculos del pecho, el vientre (abdomen) ni los msculos laterales. No levante objetos pesados.  Aplique hielo sobre la zona dolorida.  Ponga el hielo en una bolsa plstica.  Colquese una toalla entre la piel y la bolsa de hielo.  Deje el hielo durante 15 a 20 minutos durante los 2 primeros das.  Slo tome los medicamentos que le indique el mdico. SOLICITE AYUDA DE INMEDIATO SI:   Siente ms dolor o el dolor es muy molesto.  Tiene fiebre.  El dolor en el pecho [place].  Tiene nuevos sntomas.  Tiene malestar estomacal (nuseas) ovmitos.  Si se siente sudoroso o mareado.  Tiene tos con mucosidad (flema).  Tose y escupe sangre. ASEGRESE DE QUE:   Comprende estas instrucciones.  Controlar su enfermedad.  Solicitar ayuda de inmediato si no mejora o si empeora. Document Released: 02/14/2011 Document Revised: 05/20/2011 [facility] Patient Information 2015 [place]. This information is not intended  to replace advice given to you by your health care provider. Make sure you discuss any questions you have with your health care provider.  Dolor muscular (Muscle Pain) Las causas del dolor muscular (mialgia) pueden ser [place], entre ellas:  Uso excesivo del msculo  o distensin muscular, en especial si la persona no est en buen estado fsico. Esta es la causa ms comn del dolor muscular.  Lesiones.  [place].  Virus, como el de la gripe.  Enfermedades infecciosas.  Fibromialgia, que es una afeccin crnica que causa dolor de [place], fatiga y dolor muscular con la palpacin.  Enfermedades autoinmunes, como el lupus.  Determinados medicamentos, como los inhibidores de la ECA y las estatinas. El dolor muscular puede ser leve o intenso. La mayora de las veces, el dolor dura solo un corto perodo y desaparece sin tratamiento. Para diagnosticar la causa del dolor muscular, el mdico le preguntar los antecedentes mdicos. Esto significa que le preguntar cundo comenz Physiological scientist y qu ha sucedido desde ese momento. Si el dolor muscular no comenz hace mucho tiempo, el mdico puede esperar antes de hacer diversas pruebas. Si el dolor muscular comenz hace mucho tiempo, el mdico puede decidir que es ms conveniente hacer pruebas de inmediato. Si el mdico cree que Physiological scientist puede estar causado por una enfermedad, es posible que necesite hacer pruebas adicionales para descartar determinadas afecciones.  El tratamiento del dolor muscular depende de la causa. El cuidado en el hogar a menudo es suficiente para Stage manager. El mdico tambin puede recetarle un medicamento antinflamatorio. INSTRUCCIONES PARA EL CUIDADO EN EL HOGAR Controle su afeccin para ver si hay cambios. Las siguientes indicaciones ayudarn a Chief Strategy Officer que pueda sentir:  [employer] medicamentos de venta libre o recetados solamente segn las indicaciones del mdico.  Aplique hielo en el msculo dolorido:  Ponga el hielo en una bolsa plstica.  Colquese una toalla entre la piel y la bolsa de hielo.  Aplique el hielo de 3 a 4 veces por da durante 15 a 79minutos.  Puede alternar la colocacin de compresas calientes y fras en el msculo  segn lo que le haya indicado el mdico.  Si el uso excesivo del msculo le est causando dolor muscular, disminuya las actividades hasta que el dolor desaparezca.  Recuerde que es normal sentir algo de dolor muscular despus de comenzar un programa de entrenamiento. Generalmente duelen aquellos msculos que no se utilizan con frecuencia.  Si usted no hace actividad fsica con frecuencia, los ejercicios deben ser suaves y regulares.  Para reducir el riesgo de dolor muscular, haga un precalentamiento antes de la actividad fsica.  No siga haciendo actividad fsica si el dolor es muy intenso. Este tipo de dolor podra indicar que se ha lesionado un msculo. SOLICITE ATENCIN MDICA SI:  El Marketing executive empeora y los medicamentos no surten [place].  Tiene dolor muscular que dura ms de 3das.  Tiene una erupcin cutnea o fiebre junto con el dolor muscular.  Tiene dolor muscular despus de una picadura de garrapata.  Tiene dolor muscular mientras hace actividad fsica, aunque est en buen estado fsico.  Tiene enrojecimiento, sensibilidad o hinchazn junto con el dolor muscular.  Tiene dolor muscular despus de comenzar un medicamento nuevo o de cambiar la dosis de un medicamento. SOLICITE ATENCIN MDICA DE INMEDIATO SI:  Tiene dificultad para respirar.  Presenta dificultad para tragar.  Tiene dolor muscular junto con rigidez en el cuello, fiebre y vmitos.  Tiene debilidad muscular  intensa o no puede mover [employer] del cuerpo. ASEGRESE DE QUE:   Comprende estas instrucciones.  Controlar su afeccin.  Recibir ayuda de inmediato si no mejora o si empeora. Document Released: 06/04/2007 Document Revised: 03/02/2013 [facility] Patient Information 2015 [place]. This information is not intended to replace advice given to you by your health care provider. Make sure you discuss any questions you have with your health care provider.  Dolor  msculoesqueltico (Musculoskeletal Pain) El dolor musculoesqueltico se siente en huesos y msculos. El dolor puede ocurrir en cualquier parte del cuerpo. El profesional que lo asiste podr tratarlo sin Pharmacist, community causa del dolor. Lo tratar [employer] de laboratorio (sangre y [place]), las radiografas y [place]. La causa de estos dolores puede ser un virus.  CAUSAS Generalmente no existe una causa definida para este trastorno. Tambin el [employer] [place] [employer] puede deberse a la [place]. En la actividad excesiva se incluye el hacer ejercicios fsicos muy intensos cuando no se est en buena forma. El dolor de huesos tambin puede deberse a cambios climticos. Los huesos son sensibles a los cambios en la presin atmosfrica. [place]  Para proteger su privacidad, no se entregarn los [employer] pruebas por telfono. Asegrese de conseguirlos. Consulte el modo en que podr obtenerlos si no se lo han informado. Es su responsabilidad contar con los [employer].  Utilice los medicamentos de venta libre o de prescripcin para Conservation officer, historic buildings, Health and safety inspector o la [place], segn se lo indique el profesional que lo asiste. Si le han administrado medicamentos, no conduzca, no opere maquinarias ni Teacher, adult education, y tampoco firme documentos legales durante 24 horas. No beba alcohol. No tome pldoras para dormir ni otros medicamentos que Animal nutritionist.  Podr seguir con todas las actividades a menos que stas le ocasionen ms [employer]. Cuando el dolor disminuya, es importante que gradualmente reanude toda la rutina habitual. Retome las actividades comenzando lentamente. Aumente gradualmente la intensidad y la duracin de sus actividades o del ejercicio.  Durante los perodos de dolor intenso, el reposo en cama puede ser beneficioso. Recustese o sintese en la posicin que le sea ms cmoda.  Coloque hielo sobre la  zona afectada.  Ponga hielo en [place].  Colquese una toalla entre la piel y la bolsa de hielo.  Aplique el hielo durante 10 a 20 minutos 3  4 veces por da.  Si el dolor empeora, o no desaparece puede ser [employer] repetir las pruebas o Optometrist nuevos exmenes. El profesional que lo asiste podr requerir investigar ms profundamente para Animator causa posible. SOLICITE ATENCIN MDICA DE INMEDIATO SI:  Siente que el dolor empeora y no se alivia con los medicamentos.  Siente dolor en el pecho asociado a falta de aire, sudoracin, nuseas o vmitos.  El dolor se localiza en el abdomen.  Comienza a sentir nuevos sntomas que parecen ser diferentes o que lo preocupan. ASEGRESE DE QUE:   Comprende las instrucciones para el alta mdica.  Controlar su enfermedad.  Solicitar atencin mdica de inmediato segn las indicaciones. Document Released: 12/05/2004 Document Revised: 05/20/2011 [facility] Patient Information 2015 [place]. This information is not intended to replace advice given to you by your health care provider. Make sure you discuss any questions you have with your health care provider.  Traumatismo del cxis  (Tailbone Injury)  El cxis es el hueso pequeo que se encuentra en el extremo inferior de la columna vertebral. Ardelia Mems  lesin en el cxis puede implicar la distensin de los tejidos, hematomas o un hueso roto (fracture). Las mujeres son ms vulnerables debido a que su pelvis es ms ancha.  CAUSAS  Este tipo de lesin generalmente se produce al caer sobre coxis. La tensin o la friccin repetida por [employer] Corporation remo y el ciclismo tambin puede lesionar la zona. El cccix puede lesionarse durante el [place]. Las infecciones o tumores tambin pueden ejercer presin en el coxis y Engineer, drilling. A veces, la causa es desconocida.  SNTOMAS   Hematomas.  Dolor al sentarse.  Movimientos intestinales dolorosos.  En las mujeres, [place]. DIAGNSTICO  El mdico podr hacer el diagnstico basndose en los sntomas y el examen fsico Podrn tomarle una radiografa si se sospecha una fractura. Tambin podr indicarle una resonancia magntica para evaluar los sntomas.  TRATAMIENTO  El mdico podr prescribir algunos medicamentos para Glass blower/designer. La mayora de las lesiones en el coxis mejoran por s mismas despus de 4 a 6 semanas. Sin embargo, si la causa es una infeccin o un tumor, el tiempo de Arboriculturist.  PREVENCIN  Use protectores y equipo deportivo adecuados , cuando practique ciclismo y remo. Esto puede ayudar a prevenir una lesin por tensin repetida o friccin.  INSTRUCCIONES PARA EL CUIDADO DOMICILIARIO  Aplique hielo sobre la zona lesionada.  Ponga el hielo en una bolsa plstica.  Colquese una toalla entre la piel y la bolsa de hielo.  Deje la bolsa de hielo durante 15 a 80minutos 3 a 4 veces por da, durante los primeros 1  2 das.  Si se sienta sobre un aro de goma o inflable o sobre un almohadn podr Best boy. Inclnese hacia adelante al sentarse para disminuir las [employer].  Evite estar sentado durante largos perodos.  Aumente la actividad cuando el dolor se lo permita.  Utilice los medicamentos de venta libre o de prescripcin para Conservation officer, historic buildings, Health and safety inspector o la [place], segn se lo indique el profesional que lo asiste.  Puede usar laxantes si siente dolor al mover el intestino, o segn las indicaciones del mdico.  Consuma una dieta rica en fibra para prevenir la constipacin.  Cumpla con todas las visitas de control, segn le indique su mdico. SOLICITE ATENCIN MDICA SI:  El dolor empeora.  Al mover el intestino siente una molestia intensa.  No puede mover el intestino.  Tiene fiebre. ASEGRESE DE QUE:   Comprende estas instrucciones.  Controlar su enfermedad.  Solicitar ayuda de inmediato si no mejora o si empeora. Document Released:  12/05/2004 Document Revised: 05/20/2011 [facility] Patient Information 2015 [place]. This information is not intended to replace advice given to you by your health care provider. Make sure you discuss any questions you have with your health care provider.
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
## 2015-04-21 ENCOUNTER — Encounter: Payer: Self-pay | Admitting: Internal Medicine

## 2015-04-21 ENCOUNTER — Ambulatory Visit: Payer: Self-pay | Attending: Internal Medicine | Admitting: Internal Medicine

## 2015-04-21 VITALS — BP 106/71 | HR 65 | Temp 98.0°F | Resp 16 | Ht 65.0 in | Wt 169.0 lb

## 2015-04-21 DIAGNOSIS — Z0000 Encounter for general adult medical examination without abnormal findings: Secondary | ICD-10-CM

## 2015-04-21 DIAGNOSIS — N644 Mastodynia: Secondary | ICD-10-CM | POA: Insufficient documentation

## 2015-04-21 DIAGNOSIS — M549 Dorsalgia, unspecified: Secondary | ICD-10-CM | POA: Insufficient documentation

## 2015-04-21 DIAGNOSIS — Z90710 Acquired absence of both cervix and uterus: Secondary | ICD-10-CM | POA: Insufficient documentation

## 2015-04-21 DIAGNOSIS — G8929 Other chronic pain: Secondary | ICD-10-CM | POA: Insufficient documentation

## 2015-04-21 MED ORDER — IBUPROFEN 600 MG PO TABS
600.0000 mg | ORAL_TABLET | Freq: Two times a day (BID) | ORAL | Status: DC | PRN
Start: 2015-04-21 — End: 2015-07-28

## 2015-04-21 MED ORDER — TRAMADOL HCL 50 MG PO TABS
50.0000 mg | ORAL_TABLET | Freq: Three times a day (TID) | ORAL | Status: DC | PRN
Start: 2015-04-21 — End: 2015-10-19

## 2015-04-21 NOTE — Progress Notes (Signed)
   Subjective:    Patient ID: Danielle [name], female    DOB: May 03, 1975, 40 y.o.   MRN: [id number]  HPI Comments: She is concerned about pinching sensation of bilateral breast for past 2 weeks. She has a had a hysterectomy and does not have menstrual periods. She denies any family history of breast cancer.   Back Pain This is a chronic problem. The current episode started more than 1 year ago. The problem occurs intermittently. The problem is unchanged. The pain is present in the lumbar spine and thoracic spine. The quality of the pain is described as stabbing and aching. The pain does not radiate. The pain is moderate. The pain is the same all the time (pain improves through day). The symptoms are aggravated by sitting (lifting heavy objects). Stiffness is present in the morning. Pertinent negatives include no abdominal pain, bladder incontinence, bowel incontinence, numbness, pelvic pain or tingling. She has tried NSAIDs for the symptoms. The treatment provided mild relief.    Review of Systems  Gastrointestinal: Negative for abdominal pain and bowel incontinence.  Genitourinary: Negative for bladder incontinence and pelvic pain.  Musculoskeletal: Positive for back pain.  Neurological: Negative for tingling and numbness.      Objective:   Physical Exam  Cardiovascular: Normal rate, regular rhythm and normal heart sounds.   Pulmonary/Chest: Effort normal and breath sounds normal. Right breast exhibits no mass, no nipple discharge and no tenderness. Left breast exhibits no mass, no nipple discharge and no tenderness.  Musculoskeletal: Normal range of motion. She exhibits no tenderness.      Assessment & Plan:  Gilda was seen today for back pain.  Diagnoses and all orders for this visit:  Chronic back pain -     traMADol (ULTRAM) 50 MG tablet; Take 1 tablet (50 mg total) by mouth every 8 (eight) hours as needed for severe pain. -     ibuprofen (ADVIL,MOTRIN) 600 MG tablet; Take  1 tablet (600 mg total) by mouth 2 (two) times daily as needed.  Breast pain Ibuprofen may help with breast pain. I have referred her to [facility] program who can hopefully help patient get breast ultrasound or Mammogram in the next couple of months. I do not see anything to worrisome right now on exam.   Healthcare maintenance -     Ambulatory referral to Dentistry  Return if symptoms worsen or fail to improve.  Lance Bosch, NP 04/21/2015 4:11 PM

## 2015-04-21 NOTE — Patient Instructions (Signed)
Please call Sabrina Holland, [phone],  with the BCCCP (breast and cervical cancer control program) at the [facility] to set up an appointment to verify eligibility for a breast exam, mammogram, ultrasound. If you qualify this will be set up at [facility].   

## 2015-04-21 NOTE — Progress Notes (Signed)
Interpreter line used Danielle [name] ID# [id number] Patient complains of her chronic back pain Has been using tylenol and ibuprofen with little relief

## 2015-07-05 ENCOUNTER — Other Ambulatory Visit (HOSPITAL_COMMUNITY): Payer: Self-pay | Admitting: *Deleted

## 2015-07-05 DIAGNOSIS — N644 Mastodynia: Secondary | ICD-10-CM

## 2015-07-20 ENCOUNTER — Ambulatory Visit (HOSPITAL_COMMUNITY)
Admission: RE | Admit: 2015-07-20 | Discharge: 2015-07-20 | Disposition: A | Discharge status: Home / Self Care | Payer: Self-pay | Source: Ambulatory Visit | Attending: Obstetrics and Gynecology | Admitting: Obstetrics and Gynecology

## 2015-07-20 ENCOUNTER — Encounter (HOSPITAL_COMMUNITY): Payer: Self-pay

## 2015-07-20 ENCOUNTER — Ambulatory Visit
Admission: RE | Admit: 2015-07-20 | Discharge: 2015-07-20 | Disposition: A | Discharge status: Home / Self Care | Payer: No Typology Code available for payment source | Source: Ambulatory Visit | Attending: Obstetrics and Gynecology | Admitting: Obstetrics and Gynecology

## 2015-07-20 VITALS — BP 109/64 | Temp 98.4°F | Ht 65.0 in | Wt 167.2 lb

## 2015-07-20 DIAGNOSIS — N644 Mastodynia: Secondary | ICD-10-CM

## 2015-07-20 DIAGNOSIS — Z1239 Encounter for other screening for malignant neoplasm of breast: Secondary | ICD-10-CM

## 2015-07-20 NOTE — Progress Notes (Addendum)
Complaints of bilateral lower breast pain since February 2017 that comes and goes. Patient rates pain at a 7 out of 10.  Pap Smear:  Pap smear not completed today. Last Pap smear was in 2015 at the [facility] Department and normal per patient. Per patient has no history of an abnormal Pap smear. Last Pap smear result is not in EPIC. Previous Pap smear 09/27/2009 is in EPIC.  Physical exam: Breasts Breasts symmetrical. No skin abnormalities bilateral breasts. No nipple retraction bilateral breasts. No nipple discharge bilateral breasts. No lymphadenopathy. No lumps palpated bilateral breasts. No complaints of pain or tenderness on exam. Referred patient to the [facility] for diagnostic mammogram. Appointment scheduled for Thursday, Jul 20, 2015 at 1030.    Pelvic/Bimanual No Pap smear completed today since last Pap smear was in 2015 per patient. Pap smear not indicated per BCCCP guidelines.   Smoking History: Patient has never smoked.  Patient Navigation: Patient education provided. Access to services provided for patient through [facility] program. Spanish interpreter provided.  Used Spanish interpreter Sara [employer] from [employer].

## 2015-07-20 NOTE — Patient Instructions (Signed)
Educational materials on self breast awareness given. Explained to [facility] that she did not need a Pap smear today due to last Pap smear was in 2015 per patient.  Let her know BCCCP will cover Pap smears every 3 years unless has a history of abnormal Pap smears. Reminded patient that she will be due for her Pap smear next year. Referred patient to the [place] for diagnostic mammogram. Appointment scheduled for Thursday, Jul 20, 2015 at 1030. [place] Charon Jesus Tester verbalized understanding.  Marit Goodwill, Arvil Chaco, RN 11:48 AM

## 2015-07-21 ENCOUNTER — Encounter (HOSPITAL_COMMUNITY): Payer: Self-pay | Admitting: *Deleted

## 2015-07-28 ENCOUNTER — Telehealth: Payer: Self-pay | Admitting: Internal Medicine

## 2015-07-28 DIAGNOSIS — G8929 Other chronic pain: Secondary | ICD-10-CM

## 2015-07-28 DIAGNOSIS — M549 Dorsalgia, unspecified: Principal | ICD-10-CM

## 2015-07-28 MED ORDER — IBUPROFEN 600 MG PO TABS: 600.0000 mg | ORAL_TABLET | Freq: Two times a day (BID) | ORAL | Status: DC | PRN

## 2015-07-28 NOTE — Telephone Encounter (Signed)
Ibuprofen refilled x 30 days. Patient needs an appt prior to any further refills.

## 2015-07-28 NOTE — Telephone Encounter (Signed)
Pt. Came into facility requesting a refill on ibuprofen (ADVIL,MOTRIN) 600 MG tablet. Please f/u with pt.

## 2015-08-01 ENCOUNTER — Ambulatory Visit (HOSPITAL_COMMUNITY)
Admission: EM | Admit: 2015-08-01 | Discharge: 2015-08-01 | Disposition: A | Discharge status: Home / Self Care | Payer: No Typology Code available for payment source

## 2015-08-04 ENCOUNTER — Ambulatory Visit: Payer: No Typology Code available for payment source | Attending: Internal Medicine

## 2015-09-06 NOTE — Addendum Note (Signed)
Encounter addended by: Loletta Parish, RN on: 09/06/2015  9:56 AM<BR>     Documentation filed: Notes Section

## 2015-10-19 ENCOUNTER — Ambulatory Visit: Payer: Self-pay | Attending: Internal Medicine | Admitting: Physician Assistant

## 2015-10-19 VITALS — BP 110/81 | HR 68 | Temp 98.3°F | Resp 16 | Wt 165.2 lb

## 2015-10-19 DIAGNOSIS — G8929 Other chronic pain: Secondary | ICD-10-CM | POA: Insufficient documentation

## 2015-10-19 DIAGNOSIS — R112 Nausea with vomiting, unspecified: Secondary | ICD-10-CM | POA: Insufficient documentation

## 2015-10-19 DIAGNOSIS — Z79899 Other long term (current) drug therapy: Secondary | ICD-10-CM | POA: Insufficient documentation

## 2015-10-19 DIAGNOSIS — R1013 Epigastric pain: Secondary | ICD-10-CM | POA: Insufficient documentation

## 2015-10-19 DIAGNOSIS — Z791 Long term (current) use of non-steroidal anti-inflammatories (NSAID): Secondary | ICD-10-CM | POA: Insufficient documentation

## 2015-10-19 DIAGNOSIS — Z880 Allergy status to penicillin: Secondary | ICD-10-CM | POA: Insufficient documentation

## 2015-10-19 DIAGNOSIS — M549 Dorsalgia, unspecified: Secondary | ICD-10-CM

## 2015-10-19 DIAGNOSIS — R319 Hematuria, unspecified: Secondary | ICD-10-CM

## 2015-10-19 LAB — CBC WITH DIFFERENTIAL/PLATELET
BASOS PCT: 0 %
Basophils Absolute: 0 cells/uL (ref 0–200)
EOS PCT: 3 %
Eosinophils Absolute: 177 cells/uL (ref 15–500)
HCT: 42.4 % (ref 35.0–45.0)
Hemoglobin: 14.4 g/dL (ref 11.7–15.5)
LYMPHS PCT: 36 %
Lymphs Abs: 2124 cells/uL (ref 850–3900)
MCH: 29.3 pg (ref 27.0–33.0)
MCHC: 34 g/dL (ref 32.0–36.0)
MCV: 86.2 fL (ref 80.0–100.0)
MONOS PCT: 6 %
MPV: 12.7 fL — AB (ref 7.5–12.5)
Monocytes Absolute: 354 cells/uL (ref 200–950)
NEUTROS ABS: 3245 {cells}/uL (ref 1500–7800)
Neutrophils Relative %: 55 %
PLATELETS: 186 10*3/uL (ref 140–400)
RBC: 4.92 MIL/uL (ref 3.80–5.10)
RDW: 13.8 % (ref 11.0–15.0)
WBC: 5.9 10*3/uL (ref 3.8–10.8)

## 2015-10-19 LAB — POCT URINALYSIS DIPSTICK
BILIRUBIN UA: NEGATIVE
Glucose, UA: NEGATIVE
KETONES UA: NEGATIVE
LEUKOCYTES UA: NEGATIVE
Nitrite, UA: NEGATIVE
Protein, UA: NEGATIVE
SPEC GRAV UA: 1.015
Urobilinogen, UA: 0.2
pH, UA: 7.5

## 2015-10-19 LAB — COMPREHENSIVE METABOLIC PANEL
ALBUMIN: 4.6 g/dL (ref 3.6–5.1)
ALT: 207 U/L — AB (ref 6–29)
AST: 65 U/L — ABNORMAL HIGH (ref 10–30)
Alkaline Phosphatase: 116 U/L — ABNORMAL HIGH (ref 33–115)
BILIRUBIN TOTAL: 0.7 mg/dL (ref 0.2–1.2)
BUN: 13 mg/dL (ref 7–25)
CALCIUM: 9.8 mg/dL (ref 8.6–10.2)
CO2: 30 mmol/L (ref 20–31)
CREATININE: 0.64 mg/dL (ref 0.50–1.10)
Chloride: 103 mmol/L (ref 98–110)
Glucose, Bld: 95 mg/dL (ref 65–99)
Potassium: 4.2 mmol/L (ref 3.5–5.3)
Sodium: 139 mmol/L (ref 135–146)
TOTAL PROTEIN: 7.5 g/dL (ref 6.1–8.1)

## 2015-10-19 MED ORDER — ONDANSETRON HCL 4 MG PO TABS: 4.0000 mg | ORAL_TABLET | Freq: Three times a day (TID) | ORAL | 0 refills | Status: DC | PRN

## 2015-10-19 MED ORDER — IBUPROFEN 600 MG PO TABS: 600.0000 mg | ORAL_TABLET | Freq: Two times a day (BID) | ORAL | 0 refills | Status: DC | PRN

## 2015-10-19 NOTE — Progress Notes (Signed)
Pt is in the office today for vomiting.  Pt states her pain level today in the office is a 4

## 2015-10-19 NOTE — Progress Notes (Signed)
Patient ID: Danielle [name], female   DOB: Mar 28, 1975, 40 y.o.   MRN: [id number]   Danielle [name], is a 39 y.o. female  [id number]  [id number]  DOB - February 08, 1976  Subjective:  Chief Complaint and HPI: Danielle [name] is a 40 y.o. female here today for 1.5 week history of mid-epigastric abdominal pain and 2 episodes with vomiting.  She experienced vomiting on Monday and Thursday last week.  No other vomiting.  She has had some symptoms of reflux including a sensation that food is coming up in her throat.  History is by patient with [employer] interpreting.  No changes in BM, no melena, no hematochezia. No f/c.  She denies dysuria.  She has had a hysterectomy and no longer has periods/no chance of pregnancy.  No diarrhea or constipation.  She notices the pain is worse after a big meal.  Appetite is normal.    She c/o on and off R sided back pain that has been present for years.  Prescription strength ibuprofen is effective for it.      ROS:   Constitutional:  No f/c, No night sweats, No unexplained weight loss. EENT:  No vision changes, No blurry vision, No hearing changes. No mouth, throat, or ear problems.  Respiratory: No cough, No SOB Cardiac: No CP, no palpitations GI:  +abd pain, +N +VX2, no D/C. GU: No Urinary s/sx Musculoskeletal: No joint pain. +ongoing on and off R sided back pain Neuro: No headache, no dizziness, no motor weakness.  Skin: No rash Endocrine:  No polydipsia. No polyuria.  Psych: Denies SI/HI  No problems updated.  ALLERGIES: Allergies  Allergen Reactions  . Ampicillin   . Penicillins     REACTION: rash    PAST MEDICAL HISTORY: Past Medical History:  Diagnosis Date  . Back pain     MEDICATIONS AT HOME: Prior to Admission medications   Medication Sig Start Date End Date Taking? Authorizing Provider  ibuprofen (ADVIL,MOTRIN) 600 MG tablet Take 1 tablet (600 mg total) by mouth 2 (two) times daily as needed. 10/19/15  Yes Dionne Bucy  McClung, PA-C  metaxalone (SKELAXIN) 800 MG tablet Take 1 tablet (800 mg total) by mouth 3 (three) times daily. As muscle relaxer. Patient not taking: Reported on 07/20/2015 10/28/14   Billy Fischer, MD  ondansetron (ZOFRAN) 4 MG tablet Take 1 tablet (4 mg total) by mouth every 8 (eight) hours as needed for nausea or vomiting. 10/19/15   Argentina Donovan, PA-C     Objective:  EXAM:   Vitals:   10/19/15 1459 10/19/15 1500  BP:  110/81  Pulse:  68  Resp:  16  Temp:  98.3 F (36.8 C)  TempSrc:  Oral  SpO2:  95%  Weight: 165 lb 3.2 oz (74.9 kg) 165 lb 3.2 oz (74.9 kg)    General appearance : A&OX3. NAD. Non-toxic-appearing HEENT: Atraumatic and Normocephalic.  PERRLA. EOM intact.  Mouth-MMM, post pharynx WNL w/o erythema, No PND. Neck: supple, no JVD. No cervical lymphadenopathy. No thyromegaly Chest/Lungs:  Breathing-non-labored, Good air entry bilaterally, breath sounds normal without rales, rhonchi, or wheezing  CVS: S1 S2 regular, no murmurs, gallops, rubs  Abdomen: Bowel sounds present, mild midepigastric TTP, otherwise Non tender and not distended with no gaurding, rigidity or rebound. Extremities: Bilateral Lower Ext shows no edema, both legs are warm to touch with = pulse throughout Neurology:  CN II-XII grossly intact, Non focal.   Psych:  TP linear. J/I WNL. Normal speech. Appropriate eye  contact and affect.  Skin:  No Rash  Data Review Lab Results  Component Value Date   HGBA1C 5.6 05/18/2014   HGBA1C 5.4 10/12/2012     Assessment & Plan   1. Dyspepsia - POCT urinalysis dipstick - Comprehensive metabolic panel - H. pylori breath test  2. Non-intractable vomiting with nausea, unspecified vomiting type Non-acute abdomen - POCT urinalysis dipstick - Comprehensive metabolic panel - ondansetron (ZOFRAN) 4 MG tablet; Take 1 tablet (4 mg total) by mouth every 8 (eight) hours as needed for nausea or vomiting.  Dispense: 20 tablet; Refill: 0 - CBC with  Differential/Platelet  3. Chronic back pain - ibuprofen (ADVIL,MOTRIN) 600 MG tablet; Take 1 tablet (600 mg total) by mouth 2 (two) times daily as needed.  Dispense: 60 tablet; Refill: 0 OK to RF if needed  4.  Moderate blood in urine-send for culture.  She no longer has menses.  May need urology referral.    Patient have been counseled extensively about nutrition and exercise  F/up 3 months; sooner if needed or predicated by labs.   The patient was given clear instructions to go to ER or return to medical center if symptoms don't improve, worsen or new problems develop. The patient verbalized understanding. The patient was told to call to get lab results if they haven't heard anything in the next week.     Freeman Caldron, PA-C [facility] and [place], [place]   10/19/2015, 3:53 PM

## 2015-10-19 NOTE — Patient Instructions (Signed)
Acidez estomacal (Heartburn) La acidez estomacal es un tipo de dolor o de molestia que se puede presentar en la garganta o en el pecho. A menudo se la describe como Designer, multimedia. Tambin puede producir mal aliento. La sensacin de acidez puede empeorar al [employer] o inclinarse, y suele ser ms intensa durante la noche. La acidez puede deberse al retroceso de los contenidos estomacales hacia el esfago (reflujo). [place] estas medidas para aliviar las molestias y [place] complicaciones. Dieta  Siga la dieta que le haya recomendado el mdico, la cual puede incluir evitar alimentos y bebidas tales como:  Caf y t (con o sin cafena).  Bebidas que contengan alcohol.  Bebidas energizantes y deportivas.  Gaseosas o refrescos.  Chocolate y cacao.  Menta y [place].  Ajo y cebollas.  Rbano picante.  Alimentos muy condimentados y cidos, entre ellos, pimientos, [place] en polvo, curry en polvo, vinagre, salsas picantes y salsa barbacoa.  Frutas ctricas y sus jugos, como naranjas, limones y limas.  Alimentos a base de tomates, como salsa roja, [place], salsa y pizza con salsa roja.  Alimentos fritos y Radio broadcast assistant, como rosquillas, papas fritas y aderezos con alto contenido de Lobbyist.  Carnes con alto contenido de [place], como hot dogs y cortes grasos de carnes rojas y blancas, por ejemplo, filetes de entrecot, salchicha, jamn y tocino.  Productos lcteos con alto contenido de [place], como [place], [place] y queso crema.  Haga comidas pequeas y frecuentes Medical sales representative de comidas abundantes.  Evite beber [place] comidas.  No coma durante las 2 o 3horas previas a la hora de [place].  No se acueste inmediatamente despus de comer.  No haga actividad fsica enseguida despus de comer. Instrucciones generales  Est atento a cualquier cambio en los sntomas.  Tome los medicamentos de venta libre y los  recetados solamente como se lo haya indicado el mdico. No tome aspirina, ibuprofeno ni otros antiinflamatorios no esteroides (AINE), a menos que se lo haya indicado el mdico.  No consuma ningn producto que contenga tabaco, lo que incluye cigarrillos, tabaco de Higher education careers adviser y Psychologist, sport and exercise. Si necesita ayuda para dejar de fumar, consulte al mdico.  Use ropas sueltas. No use prendas ajustadas alrededor de la cintura que ejerzan presin en el abdomen.  Levante (eleve) unas 6pulgadas (15centmetros) la cabecera de la cama.  Trate de reducir [employer] de estrs con actividades como el yoga o la meditacin. Si necesita ayuda para reducir [employer] de estrs, consulte al mdico.  Si tiene sobrepeso, Multimedia programmer un peso saludable. Hable con el mdico acerca de su peso ideal y pdale asesoramiento en cuanto a la dieta que debe seguir para Therapist, music.  Concurra a todas las visitas de control como se lo haya indicado el mdico. Esto es importante. SOLICITE ATENCIN MDICA SI:  Aparecen nuevos sntomas.  Baja de peso sin causa aparente.  Tiene dificultad para tragar o siente dolor al [employer].  Tiene sibilancias o tos persistente.  Los sntomas no mejoran con Dispensing optician.  Tiene acidez frecuente durante ms de [employer]V. [place] [employer] SI:  Tiene dolor en los brazos, el cuello, los [place], la dentadura o la espalda.  Philbert Riser, se marea o tiene sensacin de desvanecimiento.  Siente falta de aire o Tourist information centre manager.  Vomita y el vmito es parecido a la sangre o a los granos de caf.  Las heces son  sanguinolentas o de color negro.   Esta informacin no tiene Marine scientist el consejo del mdico. Asegrese de hacerle al mdico cualquier pregunta que tenga.   Document Released: 11/07/2010 Document Revised: 11/16/2014 Elsevier Interactive Patient Education [employer].

## 2015-10-20 ENCOUNTER — Other Ambulatory Visit: Payer: Self-pay | Admitting: Physician Assistant

## 2015-10-20 ENCOUNTER — Telehealth: Payer: Self-pay

## 2015-10-20 LAB — H. PYLORI BREATH TEST: H. pylori Breath Test: NOT DETECTED

## 2015-10-20 LAB — URINE CULTURE: Organism ID, Bacteria: NO GROWTH

## 2015-10-20 MED ORDER — OMEPRAZOLE 20 MG PO CPDR: 20.0000 mg | DELAYED_RELEASE_CAPSULE | Freq: Every day | ORAL | 3 refills | Status: DC

## 2015-10-20 NOTE — Telephone Encounter (Signed)
[place] [phone] contacted pt to go over lab results pt is aware of results and doesn't have any questions or concerns

## 2016-01-11 ENCOUNTER — Ambulatory Visit (HOSPITAL_COMMUNITY)
Admission: RE | Admit: 2016-01-11 | Discharge: 2016-01-11 | Disposition: A | Discharge status: Home / Self Care | Payer: No Typology Code available for payment source | Source: Ambulatory Visit | Attending: Obstetrics and Gynecology | Admitting: Obstetrics and Gynecology

## 2016-01-11 ENCOUNTER — Encounter (HOSPITAL_COMMUNITY): Payer: Self-pay

## 2016-01-11 VITALS — BP 108/80 | Temp 98.0°F | Ht 64.0 in | Wt 163.7 lb

## 2016-01-11 DIAGNOSIS — Z01419 Encounter for gynecological examination (general) (routine) without abnormal findings: Secondary | ICD-10-CM

## 2016-01-11 NOTE — Addendum Note (Signed)
Encounter addended by: Loletta Parish, RN on: 01/11/2016 11:15 AM<BR>    Actions taken: Sign clinical note

## 2016-01-11 NOTE — Patient Instructions (Signed)
Informed to [place] that BCCCP will cover Pap smears and HPV typing every 5 years unless has a history of abnormal Pap smears. Let patient know will follow up with her within the next couple weeks with results of Pap smear by phone. Danielle [name] verbalized understanding.  Fallen Crisostomo, Arvil Chaco, RN 11:12 AM

## 2016-01-11 NOTE — Progress Notes (Addendum)
No complaints today.   Pap Smear: Pap smear completed today. Last Pap smear was 09/27/2009 and normal. Per patient has no history of an abnormal Pap smear. Patient thought she had a Pap smear in July 2017 and in 2015. There is no record of her having a Pap smear at [facility] where patient stated was completed. Patient did have an STD screening completed in July 2017 at the [facility] Department. Last Pap smear result is in EPIC.  Pelvic/Bimanual   Ext Genitalia No lesions, no swelling and no discharge observed on external genitalia.         Vagina Vagina pink and normal texture. No lesions or discharge observed in vagina.          Cervix Cervix is present. Cervix pink and of normal texture. No discharge observed.     Uterus Uterus is absent due to history of a supra cervical hysterectomy for benign reasons 06/16/2005.     Adnexae Bilateral ovaries present and palpable. No tenderness on palpation.          Rectovaginal No rectal exam completed today since patient had no rectal complaints. No skin abnormalities observed on exam.    Smoking History: Patient has never smoked.  Patient Navigation: Patient education provided. Access to services provided for patient through [facility] program. Spanish interpreter provided.  Used Spanish interpreter [employer] from [place].

## 2016-01-16 ENCOUNTER — Ambulatory Visit: Payer: No Typology Code available for payment source | Admitting: Family Medicine

## 2016-01-16 LAB — CYTOLOGY - PAP
DIAGNOSIS: NEGATIVE
HPV (WINDOPATH): NOT DETECTED

## 2016-01-19 ENCOUNTER — Ambulatory Visit: Payer: No Typology Code available for payment source | Attending: Internal Medicine

## 2016-01-23 ENCOUNTER — Telehealth (HOSPITAL_COMMUNITY): Payer: Self-pay | Admitting: *Deleted

## 2016-01-23 NOTE — Telephone Encounter (Signed)
Called patient with Spanish interpreter Benjamine Sprague. Let patient know that her Pap smear was normal and HPV negative. Informed patient that her next Pap smear is due in 5 years. Also, reminded patient she will need a screening mammogram in May 2018. Patient verbalized understanding.

## 2016-01-24 ENCOUNTER — Telehealth (HOSPITAL_COMMUNITY): Payer: Self-pay | Admitting: *Deleted

## 2016-01-24 NOTE — Telephone Encounter (Signed)
Telephoned patient at home number and left message to return call to BCCCP. Used interpreter Julie Sowell.  

## 2016-02-20 ENCOUNTER — Encounter: Payer: Self-pay | Admitting: Family Medicine

## 2016-02-20 ENCOUNTER — Ambulatory Visit: Payer: Self-pay | Attending: Family Medicine | Admitting: Family Medicine

## 2016-02-20 VITALS — BP 111/73 | HR 62 | Temp 97.4°F | Ht 65.0 in | Wt 162.0 lb

## 2016-02-20 DIAGNOSIS — Z79899 Other long term (current) drug therapy: Secondary | ICD-10-CM | POA: Insufficient documentation

## 2016-02-20 DIAGNOSIS — K2970 Gastritis, unspecified, without bleeding: Secondary | ICD-10-CM | POA: Insufficient documentation

## 2016-02-20 DIAGNOSIS — R1011 Right upper quadrant pain: Secondary | ICD-10-CM | POA: Insufficient documentation

## 2016-02-20 DIAGNOSIS — Z880 Allergy status to penicillin: Secondary | ICD-10-CM | POA: Insufficient documentation

## 2016-02-20 DIAGNOSIS — K2990 Gastroduodenitis, unspecified, without bleeding: Secondary | ICD-10-CM | POA: Insufficient documentation

## 2016-02-20 MED ORDER — OMEPRAZOLE 20 MG PO CPDR: 20.0000 mg | DELAYED_RELEASE_CAPSULE | Freq: Every day | ORAL | 3 refills | Status: DC

## 2016-02-20 NOTE — Progress Notes (Signed)
Subjective:  Patient ID: Danielle [name], female    DOB: [birth year]  Age: 40 y.o. MRN: [id number]  CC: Abdominal Pain (right sided)   HPI Danielle [name] presents Complaining of intermittent right upper quadrant pain for the last 2 months which she rates as a 5-6 out of 10. Pain is unrelated to meals. Four months ago she was seen for epigastric pain associated with nausea and vomiting and informs me that this pain is different from the one she experienced then but she endorses symptoms are still present and sometimes occur after meals. Denies constipation, diarrhea or fever. Denies ingestion of fatty foods.  Past Medical History:  Diagnosis Date  . Back pain     Past Surgical History:  Procedure Laterality Date  . ABDOMINAL HYSTERECTOMY    . CESAREAN SECTION      Allergies  Allergen Reactions  . Ampicillin   . Penicillins     REACTION: rash     Outpatient Medications Prior to Visit  Medication Sig Dispense Refill  . ibuprofen (ADVIL,MOTRIN) 600 MG tablet Take 1 tablet (600 mg total) by mouth 2 (two) times daily as needed. 60 tablet 0  . ondansetron (ZOFRAN) 4 MG tablet Take 1 tablet (4 mg total) by mouth every 8 (eight) hours as needed for nausea or vomiting. (Patient not taking: Reported on 02/20/2016) 20 tablet 0  . metaxalone (SKELAXIN) 800 MG tablet Take 1 tablet (800 mg total) by mouth 3 (three) times daily. As muscle relaxer. (Patient not taking: Reported on 02/20/2016) 21 tablet 0  . omeprazole (PRILOSEC) 20 MG capsule Take 1 capsule (20 mg total) by mouth daily. (Patient not taking: Reported on 02/20/2016) 30 capsule 3   No facility-administered medications prior to visit.     ROS Review of Systems  Constitutional: Negative for activity change, appetite change and fatigue.  HENT: Negative for congestion, sinus pressure and sore throat.   Eyes: Negative for visual disturbance.  Respiratory: Negative for cough, chest tightness, shortness of breath  and wheezing.   Cardiovascular: Negative for chest pain and palpitations.  Gastrointestinal:       See history of present illness  Endocrine: Negative for polydipsia.  Genitourinary: Negative for dysuria and frequency.  Musculoskeletal: Negative for arthralgias and back pain.  Skin: Negative for rash.  Neurological: Negative for tremors, light-headedness and numbness.  Hematological: Does not bruise/bleed easily.  Psychiatric/Behavioral: Negative for agitation and behavioral problems.    Objective:  BP 111/73 (BP Location: Right Arm, Patient Position: Sitting, Cuff Size: Small)   Pulse 62   Temp 97.4 F (36.3 C) (Oral)   Ht 5\' 5"  (1.651 m)   Wt 162 lb (73.5 kg)   SpO2 98%   BMI 26.96 kg/m   BP/Weight 02/20/2016 01/11/2016 99991111  Systolic BP [id number]  Diastolic BP 73 80 81  Wt. (Lbs) 162 163.7 165.2  BMI 26.96 28.1 27.49      Physical Exam  Constitutional: She is oriented to person, place, and time. She appears well-developed and well-nourished.  Cardiovascular: Normal rate, normal heart sounds and intact distal pulses.   No murmur heard. Pulmonary/Chest: Effort normal and breath sounds normal. She has no wheezes. She has no rales. She exhibits no tenderness.  Abdominal: Soft. Bowel sounds are normal. She exhibits no distension and no mass. There is no tenderness.  Musculoskeletal: Normal range of motion.  Neurological: She is alert and oriented to person, place, and time.     Assessment & Plan:  1. Abdominal pain, right upper quadrant Will need to exclude cholelithiasis but for now will treat for gastritis If symptoms persist at next visit will refer for right upper quadrant sonogram.  2. Gastritis and gastroduodenitis Could be secondary to chronic NSAID use Advised to hold off on ibuprofen use and use Tylenol instead - omeprazole (PRILOSEC) 20 MG capsule; Take 1 capsule (20 mg total) by mouth daily.  Dispense: 30 capsule; Refill: 3   Meds ordered this  encounter  Medications  . omeprazole (PRILOSEC) 20 MG capsule    Sig: Take 1 capsule (20 mg total) by mouth daily.    Dispense:  30 capsule    Refill:  3    Follow-up: Return in about 3 weeks (around 03/12/2016) for follow up on gastritis.   Arnoldo Morale MD

## 2016-02-20 NOTE — Patient Instructions (Signed)
Gastritis en los adultos  (Gastritis, Adult)  La gastritis es la irritación del estómago. Hay dos tipos de gastritis:  · Gastritis aguda. Este tipo aparece de manera repentina.  · Gastritis crónica. Este tipo dura mucho tiempo.  La gastritis aparece cuando la membrana que recubre el estómago se debilita o se daña. Sin tratamiento, la gastritis puede causar hemorragias y úlceras estomacales.  CAUSAS  Esta afección puede ser causada por lo siguiente:  · Una infección.  · Beber alcohol en exceso.  · Ciertos medicamentos.  · Tener demasiada cantidad de ácido en el estómago.  · Una enfermedad de los intestinos o del estómago.  · Estrés.  SÍNTOMAS  Los síntomas de esta afección incluyen lo siguiente:  · Dolor o ardor en la parte superior del abdomen.  · Náuseas.  · Vómitos.  · Sensación molesta de distensión después de comer.  En algunos casos no hay síntomas.  DIAGNÓSTICO  Esta afección se puede diagnosticar a través de lo siguiente:  · Una descripción de los síntomas.  · Un examen físico.  · Estudios. Estos pueden incluir los siguientes:  ? Análisis de sangre.  ? Pruebas de materia fecal.  ? Una prueba en la que se introduce un instrumento delgado y flexible que tiene una luz y una cámara en la punta a través del esófago y hacia el estómago (endoscopia superior).  ? Una prueba en la que se toma una muestra de tejido para analizarlo (biopsia).  TRATAMIENTO  Esta afección puede tratarse con medicamentos. Si la afección es causada por una infección bacteriana, pueden darle antibióticos. Si es causada por demasiada cantidad de ácido en el estómago, pueden darle medicamentos llamados bloqueadores H2, inhibidores de la bomba de protones o antiácidos. El tratamiento también puede incluir la suspensión del uso de ciertos medicamentos, como la aspirina, el ibuprofeno u otros antiinflamatorios no esteroides (AINE).  INSTRUCCIONES PARA EL CUIDADO EN EL HOGAR  · Tome los medicamentos de venta libre y los recetados solamente como se  lo haya indicado el médico.  · Si le recetaron un antibiótico, tómelo como se lo haya indicado el médico. No deje de tomar los antibióticos aunque comience a sentirse mejor.  · Beba suficiente líquido para mantener la orina clara o de color amarillo pálido.  · Haga varias comidas pequeñas y frecuentes durante el día en lugar de comidas abundantes.    SOLICITE ATENCIÓN MÉDICA SI:  · Los síntomas empeoran.  · Los síntomas regresan después del tratamiento.    SOLICITE ATENCIÓN MÉDICA DE INMEDIATO SI:  · Vomita sangre de color rojo brillante o una sustancia similar a los granos de café.  · La materia fecal es negra o de color rojo oscuro.  · No puede retener los líquidos.  · El dolor abdominal empeora.  · Tiene fiebre.  · No mejora luego de 1 semana.    Esta información no tiene como fin reemplazar el consejo del médico. Asegúrese de hacerle al médico cualquier pregunta que tenga.  Document Released: 12/05/2004 Document Revised: 11/16/2014 Document Reviewed: 11/19/2014  Elsevier Interactive Patient Education © 2017 [employer] Inc[employer]

## 2016-05-29 ENCOUNTER — Ambulatory Visit: Payer: Self-pay | Attending: Family Medicine

## 2016-06-26 ENCOUNTER — Ambulatory Visit: Payer: Self-pay | Attending: Family Medicine | Admitting: Family Medicine

## 2016-06-26 ENCOUNTER — Encounter: Payer: Self-pay | Admitting: Family Medicine

## 2016-06-26 VITALS — BP 110/73 | HR 62 | Temp 98.0°F | Ht 65.0 in | Wt 164.0 lb

## 2016-06-26 DIAGNOSIS — G8929 Other chronic pain: Secondary | ICD-10-CM

## 2016-06-26 DIAGNOSIS — Z880 Allergy status to penicillin: Secondary | ICD-10-CM | POA: Insufficient documentation

## 2016-06-26 DIAGNOSIS — M546 Pain in thoracic spine: Secondary | ICD-10-CM | POA: Insufficient documentation

## 2016-06-26 DIAGNOSIS — K2990 Gastroduodenitis, unspecified, without bleeding: Secondary | ICD-10-CM | POA: Insufficient documentation

## 2016-06-26 DIAGNOSIS — Z79899 Other long term (current) drug therapy: Secondary | ICD-10-CM | POA: Insufficient documentation

## 2016-06-26 DIAGNOSIS — K2970 Gastritis, unspecified, without bleeding: Secondary | ICD-10-CM

## 2016-06-26 DIAGNOSIS — Z13228 Encounter for screening for other metabolic disorders: Secondary | ICD-10-CM

## 2016-06-26 MED ORDER — OMEPRAZOLE 20 MG PO CPDR: 20.0000 mg | DELAYED_RELEASE_CAPSULE | Freq: Every day | ORAL | 3 refills | Status: DC

## 2016-06-26 MED ORDER — METHOCARBAMOL 750 MG PO TABS: 750.0000 mg | ORAL_TABLET | Freq: Three times a day (TID) | ORAL | 1 refills | Status: DC | PRN

## 2016-06-26 NOTE — Patient Instructions (Signed)
Dolor de espalda en adultos (Back Pain, Adult) El dolor de espalda es muy frecuente en los adultos.La causa del dolor de espalda es rara vez peligrosa y el dolor a menudo mejora con el tiempo.Es posible que se desconozca la causa de esta afeccin. Algunas causas comunes son las siguientes:  Distensin de los msculos o ligamentos que sostienen la columna vertebral.  Desgaste (degeneracin) de los discos vertebrales.  Artritis.  Lesiones directas en la espalda. En muchas personas, el dolor de espalda es recurrente. Como rara vez es peligroso, las personas pueden aprender a manejar esta afeccin por s mismas. INSTRUCCIONES PARA EL CUIDADO EN EL HOGAR Controle su dolor de espalda a fin de detectar algn cambio. Las siguientes indicaciones ayudarn a aliviar cualquier molestia que pueda sentir:  Permanezca activo. Si permanece sentado o de pie en un mismo lugar durante mucho tiempo, se tensiona la espalda. No se siente, conduzca o permanezca de pie en un mismo lugar durante ms de 30 minutos seguidos. Realice caminatas cortas en superficies planas tan pronto como le sea posible.Trate de caminar un poco ms de tiempo cada da.  Haga ejercicio regularmente como se lo haya indicado el mdico. El ejercicio ayuda a que su espalda se cure ms rpidamente. Tambin ayuda a prevenir futuras lesiones al mantener los msculos fuertes y flexibles.  No permanezca en la cama.Si hace reposo ms de 1 a 2 das, puede demorar su recuperacin.  Preste atencin a su cuerpo al inclinarse y levantarse. Las posiciones ms cmodas son las que ejercen menos tensin en la espalda en recuperacin. Siempre use tcnicas apropiadas para levantar objetos, como por ejemplo:  Flexionar las rodillas.  Mantener la carga cerca del cuerpo.  No torcerse.  Encuentre una posicin cmoda para dormir. Use un colchn firme y recustese de costado con las rodillas ligeramente flexionadas. Si se recuesta sobre la espalda, coloque  una almohada debajo de las rodillas.  Evite sentir ansiedad o estrs.El estrs aumenta la tensin muscular y puede empeorar el dolor de espalda.Es importante reconocer si se siente ansioso o estresado y aprender maneras de controlarlo, por ejemplo haciendo ejercicio.  Tome los medicamentos solamente como se lo haya indicado el mdico. Los medicamentos de venta libre para aliviar el dolor y la inflamacin a menudo son los ms eficaces.El mdico puede recetarle relajantes musculares.Estos medicamentos ayudan a calmar el dolor de modo que pueda reanudar ms rpidamente sus actividades normales y el ejercicio saludable.  Aplique hielo sobre la zona lesionada.  Ponga el hielo en una bolsa plstica.  Coloque una toalla entre la piel y la bolsa de hielo.  Deje el hielo durante 20minutos, 2 a 3veces por da, durante los primeros 2 o 3das. Despus de eso, puede alternar el hielo y el calor para reducir el dolor y los espasmos.  Mantenga un peso saludable. El exceso de peso ejerce presin adicional sobre la espalda y hace que resulte difcil mantener una buena postura. SOLICITE ATENCIN MDICA SI:  Siente un dolor que no se alivia con reposo o medicamentos.  Siente mucho dolor que se extiende a las piernas o los glteos.  El dolor no mejora en una semana.  Siente dolor por la noche.  Pierde peso.  Siente escalofros o fiebre. SOLICITE ATENCIN MDICA DE INMEDIATO SI:  Tiene nuevos problemas para controlar la vejiga o los intestinos.  Siente debilidad o adormecimiento inusuales en los brazos o en las piernas.  Siente nuseas o vmitos.  Siente dolor abdominal.  Siente que va a desmayarse. Esta informacin   no tiene como fin reemplazar el consejo del mdico. Asegrese de hacerle al mdico cualquier pregunta que tenga. Document Released: 02/25/2005 Document Revised: 03/18/2014 Document Reviewed: 06/29/2013 Elsevier Interactive Patient Education  2017 Elsevier Inc.  

## 2016-06-26 NOTE — Progress Notes (Signed)
Subjective:  Patient ID: Danielle [name], female    DOB: 1975/07/29  Age: 41 y.o. MRN: [id number]  CC: Back Pain (middle)   HPI Danielle [name] is a 41 year old female who presents With a one-year history of intermittent bilateral thoracic back pain worse with twisting motion.  She denies a history of heavy lifting and denies radiation of pain. Pain is described as  7/10 and she takes ibuprofen 200 mg with no relief in symptoms.  She does have a history of gastritis and was placed on omeprazole which she has not been taking.  Past Medical History:  Diagnosis Date  . Back pain     Past Surgical History:  Procedure Laterality Date  . ABDOMINAL HYSTERECTOMY    . CESAREAN SECTION      Allergies  Allergen Reactions  . Ampicillin   . Penicillins     REACTION: rash     Outpatient Medications Prior to Visit  Medication Sig Dispense Refill  . omeprazole (PRILOSEC) 20 MG capsule Take 1 capsule (20 mg total) by mouth daily. (Patient not taking: Reported on 06/26/2016) 30 capsule 3  . ondansetron (ZOFRAN) 4 MG tablet Take 1 tablet (4 mg total) by mouth every 8 (eight) hours as needed for nausea or vomiting. (Patient not taking: Reported on 02/20/2016) 20 tablet 0   No facility-administered medications prior to visit.     ROS Review of Systems  Constitutional: Negative for activity change and appetite change.  HENT: Negative for sinus pressure and sore throat.   Respiratory: Negative for chest tightness, shortness of breath and wheezing.   Cardiovascular: Negative for chest pain and palpitations.  Gastrointestinal: Negative for abdominal distention, abdominal pain and constipation.  Genitourinary: Negative.   Musculoskeletal: Positive for back pain.  Psychiatric/Behavioral: Negative for behavioral problems and dysphoric mood.    Objective:  BP 110/73 (BP Location: Right Arm, Patient Position: Sitting, Cuff Size: Small)   Pulse 62   Temp 98 F (36.7 C) (Oral)    Ht _0  (1.651 m)   Wt 164 lb (74.4 kg)   SpO2 96%   BMI 27.29 kg/m   BP/Weight 06/26/2016 02/20/2016 39/0/3009  Systolic BP 233 007 622  Diastolic BP 73 73 80  Wt. (Lbs) 164 162 163.7  BMI 27.29 26.96 28.1      Physical Exam  Constitutional: She is oriented to person, place, and time. She appears well-developed and well-nourished.  Cardiovascular: Normal rate, normal heart sounds and intact distal pulses.   No murmur heard. Pulmonary/Chest: Effort normal and breath sounds normal. She has no wheezes. She has no rales. She exhibits no tenderness.  Abdominal: Soft. Bowel sounds are normal. She exhibits no distension and no mass. There is tenderness (mild epigastric tenderness).  Musculoskeletal: Normal range of motion. She exhibits no edema or tenderness.  Mild tenderness with twisting motion of the spine  Neurological: She is alert and oriented to person, place, and time.     Assessment & Plan:   1. Chronic bilateral thoracic back pain Placed on Robaxin Discouraged use of NSAIDs due to gastritis She could take Tylenol in addition  2. Gastritis and gastroduodenitis Uncontrolled due to not taking omeprazole which I have advised her to resume Avoid NSAIDs - omeprazole (PRILOSEC) 20 MG capsule; Take 1 capsule (20 mg total) by mouth daily.  Dispense: 30 capsule; Refill: 3  3. Screening for metabolic disorder - [id number]; Future - Lipid panel; Future   Meds ordered this encounter  Medications  .  methocarbamol (ROBAXIN-750) 750 MG tablet    Sig: Take 1 tablet (750 mg total) by mouth every 8 (eight) hours as needed for muscle spasms.    Dispense:  60 tablet    Refill:  1  . omeprazole (PRILOSEC) 20 MG capsule    Sig: Take 1 capsule (20 mg total) by mouth daily.    Dispense:  30 capsule    Refill:  3    Follow-up: Return in about 6 weeks (around 08/07/2016) for follow up on back pain.   Arnoldo Morale MD

## 2016-06-28 ENCOUNTER — Ambulatory Visit: Payer: Self-pay | Attending: Family Medicine

## 2016-06-28 DIAGNOSIS — Z13228 Encounter for screening for other metabolic disorders: Secondary | ICD-10-CM | POA: Insufficient documentation

## 2016-06-28 NOTE — Addendum Note (Signed)
Addended by: Westley Gambles A on: 06/28/2016 09:31 AM   Modules accepted: Orders

## 2016-06-28 NOTE — Progress Notes (Signed)
Patient here for lab visit only 

## 2016-06-29 LAB — CMP14+EGFR
A/G RATIO: 1.6 (ref 1.2–2.2)
ALBUMIN: 4.1 g/dL (ref 3.5–5.5)
ALT: 31 IU/L (ref 0–32)
AST: 20 IU/L (ref 0–40)
Alkaline Phosphatase: 104 IU/L (ref 39–117)
BILIRUBIN TOTAL: 0.4 mg/dL (ref 0.0–1.2)
BUN / CREAT RATIO: 21 (ref 9–23)
BUN: 12 mg/dL (ref 6–24)
CHLORIDE: 103 mmol/L (ref 96–106)
CO2: 24 mmol/L (ref 18–29)
Calcium: 8.9 mg/dL (ref 8.7–10.2)
Creatinine, Ser: 0.58 mg/dL (ref 0.57–1.00)
GFR calc non Af Amer: 116 mL/min/{1.73_m2} (ref >59)
GFR, EST AFRICAN AMERICAN: 133 mL/min/{1.73_m2} (ref >59)
GLOBULIN, TOTAL: 2.6 g/dL (ref 1.5–4.5)
Glucose: 96 mg/dL (ref 65–99)
POTASSIUM: 4.5 mmol/L (ref 3.5–5.2)
Sodium: 141 mmol/L (ref 134–144)
TOTAL PROTEIN: 6.7 g/dL (ref 6.0–8.5)

## 2016-06-29 LAB — LIPID PANEL
Chol/HDL Ratio: 3 ratio (ref 0.0–4.4)
Cholesterol, Total: 151 mg/dL (ref 100–199)
HDL: 50 mg/dL (ref >39)
LDL Calculated: 84 mg/dL (ref 0–99)
Triglycerides: 86 mg/dL (ref 0–149)
VLDL CHOLESTEROL CAL: 17 mg/dL (ref 5–40)

## 2016-07-03 ENCOUNTER — Other Ambulatory Visit: Payer: Self-pay | Admitting: Obstetrics and Gynecology

## 2016-07-03 DIAGNOSIS — Z1231 Encounter for screening mammogram for malignant neoplasm of breast: Secondary | ICD-10-CM

## 2016-07-10 NOTE — Progress Notes (Signed)
Writer sent patient lab results and a letter after trying to contact patient several times by phone.

## 2016-07-18 ENCOUNTER — Ambulatory Visit (HOSPITAL_COMMUNITY): Payer: Self-pay

## 2016-08-08 ENCOUNTER — Ambulatory Visit: Payer: Self-pay | Attending: Family Medicine | Admitting: Family Medicine

## 2016-08-08 ENCOUNTER — Encounter: Payer: Self-pay | Admitting: Family Medicine

## 2016-08-08 VITALS — BP 106/71 | HR 70 | Temp 97.5°F | Resp 18 | Ht 65.0 in | Wt 166.0 lb

## 2016-08-08 DIAGNOSIS — Z880 Allergy status to penicillin: Secondary | ICD-10-CM | POA: Insufficient documentation

## 2016-08-08 DIAGNOSIS — M546 Pain in thoracic spine: Secondary | ICD-10-CM | POA: Insufficient documentation

## 2016-08-08 DIAGNOSIS — Z9889 Other specified postprocedural states: Secondary | ICD-10-CM | POA: Insufficient documentation

## 2016-08-08 DIAGNOSIS — G8929 Other chronic pain: Secondary | ICD-10-CM

## 2016-08-08 DIAGNOSIS — Z90710 Acquired absence of both cervix and uterus: Secondary | ICD-10-CM | POA: Insufficient documentation

## 2016-08-08 MED ORDER — TRAMADOL HCL 50 MG PO TABS: 50.0000 mg | ORAL_TABLET | Freq: Two times a day (BID) | ORAL | 1 refills | Status: DC | PRN

## 2016-08-08 NOTE — Progress Notes (Signed)
Subjective:  Patient ID: Danielle [name], female    DOB: 09-14-1975  Age: 41 y.o. MRN: [id number]  CC: Back Pain   HPI Danielle [name] is a 41 year old female who presents for a follow up of thoracic back pain which she states has not improved with the use of Robaxin.   At her last visit she had given a one-year history of intermittent bilateral thoracic back pain worse with twisting motion.  She denied a history of heavy lifting and denied radiation of pain. Pain was described as  7/10 but today is a 4/10 and unrelieved with Ibuprofen 200 mg   Past Medical History:  Diagnosis Date  . Back pain     Past Surgical History:  Procedure Laterality Date  . ABDOMINAL HYSTERECTOMY    . CESAREAN SECTION      Allergies  Allergen Reactions  . Ampicillin   . Penicillins     REACTION: rash    .  Outpatient Medications Prior to Visit  Medication Sig Dispense Refill  . ibuprofen (ADVIL,MOTRIN) 200 MG tablet Take 200 mg by mouth every 6 (six) hours as needed.    . methocarbamol (ROBAXIN-750) 750 MG tablet Take 1 tablet (750 mg total) by mouth every 8 (eight) hours as needed for muscle spasms. 60 tablet 1  . omeprazole (PRILOSEC) 20 MG capsule Take 1 capsule (20 mg total) by mouth daily. 30 capsule 3   No facility-administered medications prior to visit.     ROS Review of Systems Constitutional: Negative for activity change and appetite change.  HENT: Negative for sinus pressure and sore throat.   Respiratory: Negative for chest tightness, shortness of breath and wheezing.   Cardiovascular: Negative for chest pain and palpitations.  Gastrointestinal: Negative for abdominal distention, abdominal pain and constipation.  Genitourinary: Negative.   Musculoskeletal: Positive for back pain.  Psychiatric/Behavioral: Negative for behavioral problems and dysphoric mood  Objective:  BP 106/71 (BP Location: Right Arm, Patient Position: Sitting, Cuff Size: Normal)   Pulse  70   Temp 97.5 F (36.4 C) (Oral)   Resp 18   Ht 5\' 5"  (1.651 m)   Wt 166 lb (75.3 kg)   SpO2 97%   BMI 27.62 kg/m   BP/Weight 08/08/2016 06/26/2016 19/41/7408  Systolic BP 144 818 563  Diastolic BP 71 73 73  Wt. (Lbs) 166 164 162  BMI 27.62 27.29 26.96      Physical Exam Constitutional: She is oriented to person, place, and time. She appears well-developed and well-nourished.  Cardiovascular: Normal rate, normal heart sounds and intact distal pulses.   No murmur heard. Pulmonary/Chest: Effort normal and breath sounds normal. She has no wheezes. She has no rales. She exhibits no tenderness.  Abdominal: Soft. Bowel sounds are normal. She exhibits no tenderness,distension and no mass. Musculoskeletal: Normal range of motion. She exhibits no edema or tenderness.  Mild tenderness with twisting motion of the spine and on palpation of thoracic spine  Neurological: She is alert and oriented to person, place, and time.   Assessment & Plan:   1. Chronic bilateral thoracic back pain Continue Robaxin Advised to apply heat Physical therapy at home and if symptoms persist I will need to refer her for physical therapy-the fact that she has no medical coverage will be a challenge - traMADol (ULTRAM) 50 MG tablet; Take 1 tablet (50 mg total) by mouth every 12 (twelve) hours as needed.  Dispense: 50 tablet; Refill: 1   Meds ordered this  encounter  Medications  . traMADol (ULTRAM) 50 MG tablet    Sig: Take 1 tablet (50 mg total) by mouth every 12 (twelve) hours as needed.    Dispense:  50 tablet    Refill:  1    Follow-up: Return in about 6 weeks (around 09/19/2016) for follow up up of back pain.   Arnoldo Morale MD

## 2016-08-08 NOTE — Patient Instructions (Signed)
Ejercicios para la espalda (Back Exercises) Los siguientes ejercicios fortalecen los msculos que dan soporte a la espalda y, adems, ayudan a mantener la flexibilidad de la zona lumbar. Hacer estos ejercicios puede ser de ayuda para evitar el dolor de espalda o aliviar el dolor actual. Si tiene dolor o molestias en la espalda, intente hacer estos ejercicios 2 o 3veces por da, o como se lo haya indicado el mdico. Cuando el dolor desaparezca, hgalos una vez por da, pero haga ms repeticiones de cada ejercicio. Si no tiene dolor o molestias en la espalda, haga estos ejercicios una vez por da o como se lo haya indicado el mdico. EJERCICIOS Rodilla al pecho Repita estos pasos 3 o 5veces con cada pierna: 1. Acustese boca arriba sobre una cama dura o sobre el suelo con las piernas extendidas. 2. Lleve una rodilla al pecho. La otra pierna debe quedar extendida y en contacto con el suelo. 3. Mantenga la rodilla contra el pecho. Para lograrlo tmese la rodilla o el muslo. 4. Tire de la rodilla hasta sentir una elongacin suave en la parte baja de la espalda. 5. Mantenga la elongacin durante 10 a 30segundos. 6. Suelte y extienda la pierna lentamente. Inclinacin de la pelvis Repita estos pasos 5 o 10veces: 1. Acustese boca arriba sobre una cama dura o sobre el suelo con las piernas extendidas. 2. Flexione las rodillas de modo que apunten al techo y los pies queden apoyados en el suelo. 3. Contraiga los msculos de la parte baja del abdomen para empujar la zona lumbar contra el suelo. Con este movimiento se inclinar la pelvis de modo que el cccix apunte hacia el techo, en lugar de apuntar en direccin a los pies o al suelo. 4. Contraiga suavemente y respire con normalidad mientras mantiene esta posicin durante 5 a 10segundos. El perro y el gato Repita estos pasos hasta que la zona lumbar se vuelva ms flexible: 1. Apoye las palmas de las manos y las rodillas sobre una superficie firme. Las  manos deben estar alineadas con los hombros y las rodillas con las caderas. Puede colocarse almohadillas debajo de las rodillas para estar cmodo. 2. Deje caer la cabeza y baje el cccix en direccin al suelo de modo que la zona lumbar se arquee como el lomo de un gato asustado. 3. Mantenga esta posicin durante 5segundos. 4. Lentamente, levante la cabeza y eleve el cccix de modo que apunte en direccin al techo para que la espalda forme un arco hundido como el lomo de un perro contento. 5. Mantenga esta posicin durante 5segundos. Flexiones de brazos Repita estos pasos 5 o 10veces: 1. Acustese boca abajo en el suelo. 2. Coloque las palmas de las manos cerca de la cabeza, separadas aproximadamente al ancho de los hombros. 3. Con la espalda lo ms relajada posible y las caderas apoyadas en el suelo, extienda lentamente los brazos para levantar la mitad superior del cuerpo y elevar los hombros. No use los msculos de la espalda para elevar la parte superior del torso. Puede cambiar las manos de lugar para estar ms cmodo. 4. Mantenga esta posicin durante 5segundos mientras mantiene la espalda relajada. 5. Lentamente vuelva a la posicin horizontal. Puentes Repita estos pasos 10veces: 1. Acustese boca arriba sobre una superficie firme. 2. Flexione las rodillas de modo que apunten al techo y los pies queden apoyados en el suelo. 3. Contraiga los glteos y despegue las nalgas del suelo hasta que la cintura est casi a la misma altura que las rodillas. Debe   sentir el trabajo muscular en las nalgas y la parte posterior de los muslos. Si no siente el esfuerzo de estos msculos, aleje los pies 1 o 2pulgadas (2,5 o 5centmetros) de las nalgas. 4. Mantenga esta posicin durante 3 a 5segundos. 5. Baje lentamente las caderas a la posicin inicial y relaje los glteos por completo. Si este ejercicio le resulta muy fcil, intente realizarlo con los brazos cruzados sobre el  pecho. Abdominales Repita estos pasos 5 o 10veces: 1. Acustese boca arriba sobre una cama dura o sobre el suelo con las piernas extendidas. 2. Flexione las rodillas de modo que apunten al techo y los pies queden apoyados en el suelo. 3. Cruce los brazos sobre el pecho. 4. Baje levemente el mentn en direccin al pecho sin doblar el cuello. 5. Contraiga los msculos del abdomen y con lentitud eleve el torso lo suficiente como para despegar levemente los omplatos del suelo. No eleve el torso ms alto que eso, porque esto puede sobreexigir a la zona lumbar y no ayuda a fortalecer los msculos abdominales. 6. Regrese lentamente a la posicin inicial. Elevaciones de espalda Repita estos pasos 5 o 10veces: 1. Acustese boca abajo con los brazos a los costados del cuerpo y apoye la frente en el suelo. 2. Contraiga los msculos de las piernas y los glteos. 3. Lentamente despegue el pecho del suelo mientras mantiene las caderas bien apoyadas en el suelo. Mantenga la nuca alineada con la curvatura de la espalda. Los ojos deben mirar al suelo. 4. Mantenga esta posicin durante 3 a 5segundos. 5. Regrese lentamente a la posicin inicial. SOLICITE ATENCIN MDICA SI:  El dolor o las molestias en la espalda se vuelven mucho ms intensos cuando hace un ejercicio.  El dolor o las molestias en la espalda no se alivian en el trmino de las 2horas posteriores a hacer los ejercicios. Si tiene alguno de estos problemas, deje de hacer los ejercicios de inmediato. No vuelva a hacer los ejercicios a menos que el mdico lo autorice. SOLICITE ATENCIN MDICA DE INMEDIATO SI:  Siente un dolor sbito e intenso en la espalda. Si esto ocurre, deje de hacer los ejercicios de inmediato. No vuelva a hacer los ejercicios a menos que el mdico lo autorice.  Esta informacin no tiene como fin reemplazar el consejo del mdico. Asegrese de hacerle al mdico cualquier pregunta que tenga. Document Released: 02/25/2005  Document Revised: 11/16/2014 Document Reviewed: 04/21/2014 Elsevier Interactive Patient Education  2017 Elsevier Inc.  

## 2016-08-20 ENCOUNTER — Telehealth: Payer: Self-pay | Admitting: *Deleted

## 2016-08-20 ENCOUNTER — Other Ambulatory Visit: Payer: Self-pay | Admitting: Family Medicine

## 2016-08-20 DIAGNOSIS — G8929 Other chronic pain: Secondary | ICD-10-CM

## 2016-08-20 DIAGNOSIS — M546 Pain in thoracic spine: Principal | ICD-10-CM

## 2016-08-20 MED ORDER — TIZANIDINE HCL 4 MG PO TABS: 4.0000 mg | ORAL_TABLET | Freq: Three times a day (TID) | ORAL | 2 refills | Status: DC | PRN

## 2016-08-20 NOTE — Telephone Encounter (Signed)
Per [facility], pt is asking for another Tramadol script.  Pt  states she did not get her prescription for Tramadol at [facility]. Informed that prescription was printed on 08/08/16.

## 2016-08-20 NOTE — Progress Notes (Signed)
Methocarbamol not available in the pharmacy; switched to tizanidine.

## 2016-08-21 MED ORDER — TRAMADOL HCL 50 MG PO TABS: 50.0000 mg | ORAL_TABLET | Freq: Two times a day (BID) | ORAL | 1 refills | Status: DC | PRN

## 2016-08-21 NOTE — Telephone Encounter (Signed)
Ready for pick up

## 2016-10-01 ENCOUNTER — Other Ambulatory Visit: Payer: Self-pay | Admitting: Family Medicine

## 2016-10-01 MED ORDER — TIZANIDINE HCL 4 MG PO TABS: 4.0000 mg | ORAL_TABLET | Freq: Three times a day (TID) | ORAL | 2 refills | Status: DC | PRN

## 2016-12-09 ENCOUNTER — Ambulatory Visit: Payer: Self-pay

## 2016-12-17 ENCOUNTER — Ambulatory Visit: Payer: Self-pay | Attending: Family Medicine

## 2016-12-19 ENCOUNTER — Ambulatory Visit: Payer: Self-pay | Attending: Family Medicine | Admitting: Physician Assistant

## 2016-12-19 VITALS — BP 104/67 | HR 83 | Temp 98.7°F | Resp 18 | Ht 65.0 in | Wt 169.0 lb

## 2016-12-19 DIAGNOSIS — K2970 Gastritis, unspecified, without bleeding: Secondary | ICD-10-CM | POA: Insufficient documentation

## 2016-12-19 DIAGNOSIS — Z880 Allergy status to penicillin: Secondary | ICD-10-CM | POA: Insufficient documentation

## 2016-12-19 DIAGNOSIS — R110 Nausea: Secondary | ICD-10-CM | POA: Insufficient documentation

## 2016-12-19 DIAGNOSIS — N3001 Acute cystitis with hematuria: Secondary | ICD-10-CM

## 2016-12-19 DIAGNOSIS — K2990 Gastroduodenitis, unspecified, without bleeding: Secondary | ICD-10-CM | POA: Insufficient documentation

## 2016-12-19 DIAGNOSIS — R1011 Right upper quadrant pain: Secondary | ICD-10-CM | POA: Insufficient documentation

## 2016-12-19 DIAGNOSIS — Z79899 Other long term (current) drug therapy: Secondary | ICD-10-CM | POA: Insufficient documentation

## 2016-12-19 LAB — POCT URINALYSIS DIPSTICK
BILIRUBIN UA: NEGATIVE
GLUCOSE UA: NEGATIVE
Ketones, UA: NEGATIVE
Nitrite, UA: NEGATIVE
Protein, UA: NEGATIVE
Spec Grav, UA: 1.015 (ref 1.010–1.025)
Urobilinogen, UA: 0.2 E.U./dL
pH, UA: 7.5 (ref 5.0–8.0)

## 2016-12-19 MED ORDER — ONDANSETRON HCL 8 MG PO TABS: ORAL_TABLET | ORAL | 0 refills | Status: DC

## 2016-12-19 MED ORDER — OMEPRAZOLE 20 MG PO CPDR: 20.0000 mg | DELAYED_RELEASE_CAPSULE | Freq: Every day | ORAL | 3 refills | Status: DC

## 2016-12-19 MED ORDER — NITROFURANTOIN MONOHYD MACRO 100 MG PO CAPS: 100.0000 mg | ORAL_CAPSULE | Freq: Two times a day (BID) | ORAL | 0 refills | Status: DC

## 2016-12-19 MED ORDER — NAPROXEN 500 MG PO TABS: 500.0000 mg | ORAL_TABLET | Freq: Two times a day (BID) | ORAL | 0 refills | Status: DC

## 2016-12-19 NOTE — Patient Instructions (Signed)
Dolor en el flanco  (Flank Pain)  El dolor en el flanco es el que se siente a un lado del cuerpo. El flanco es la zona en un lado del cuerpo, entre la parte superior del vientre (abdomen) y la espalda. El dolor en esta zona puede tener diferentes causas. CUIDADOS EN EL HOGAR  Los cuidados en el hogar dependern de la causa del dolor.   Haga reposo tal [employer] indic el mdico.  Beba gran cantidad de lquido para mantener el pis (orina) de tono claro o amarillo plido.  Slo tome los medicamentos que le indique el mdico.  Informe al mdico si el dolor se modifica.  Concurra a las visitas de control con el mdico. SOLICITE AYUDA DE INMEDIATO SI:   El dolor no mejora con los medicamentos prescriptos.   Tiene nuevos sntomas o estos empeoran.  El dolor [place].   Siente dolor en el vientre (abdominal).   Comienza a sentir falta de [place].   Siente malestar estomacal (nuseas).   Comienza a vomitar.   El vientre se inflama (se hincha).   Se siente mareado o se desvanece (se desmaya).   Observa sangre en la orina.  Tiene fiebre o sntomas que persisten durante ms de 2-3 das.  Tiene fiebre y los sntomas empeoran repentinamente. ASEGRESE DE QUE:   Comprende estas instrucciones.  Controlar su enfermedad.  Solicitar ayuda de inmediato si no mejora o si empeora. Esta informacin no tiene Marine scientist el consejo del mdico. Asegrese de hacerle al mdico cualquier pregunta que tenga. Document Released: 11/20/2011 Elsevier Interactive Patient Education  2017 [employer].

## 2016-12-19 NOTE — Progress Notes (Signed)
Patient ID: Danielle [name], female   DOB: 07/08/1975, 41 y.o.   MRN: [id number]    Danielle [name], is a 41 y.o. female  [id number]  [id number]  DOB - 06/24/1975  Subjective:  Chief Complaint and HPI: Danielle [name] is a 41 y.o. female here today for  R sided pain in abdomen X 3 days, has awakened her from sleep, +aches, constant.  Also LBP. No f/c. No V/D.  + nausea. No dysuria. No h/o kidney stones.  Food sometimes makes the pain worse. No radicular s/sx.  No pelvic pain.  She has had pain like this before but it seems worse the last few days.  Today, the pain has actually lessened some.    "Esmeralda" with [employer].    ED/Hospital notes reviewed.   Social:  Has 77 yr old daughter.   ROS:   Constitutional:  No f/c, No night sweats, No unexplained weight loss. EENT:  No vision changes, No blurry vision, No hearing changes. No mouth, throat, or ear problems.  Respiratory: No cough, No SOB Cardiac: No CP, no palpitations GI:  + abd pain, +N, no V/D. GU: No Urinary s/sx Musculoskeletal: No joint pain other than LBP which is ongoing intermittently for her Neuro: No headache, no dizziness, no motor weakness.  Skin: No rash Endocrine:  No polydipsia. No polyuria.  Psych: Denies SI/HI  No problems updated.  ALLERGIES: Allergies  Allergen Reactions  . Ampicillin   . Penicillins     REACTION: rash    PAST MEDICAL HISTORY: Past Medical History:  Diagnosis Date  . Back pain     MEDICATIONS AT HOME: Prior to Admission medications   Medication Sig Start Date End Date Taking? Authorizing Provider  ibuprofen (ADVIL,MOTRIN) 200 MG tablet Take 200 mg by mouth every 6 (six) hours as needed.    [provider]  naproxen (NAPROSYN) 500 MG tablet Take 1 tablet (500 mg total) by mouth 2 (two) times daily with a meal. 12/19/16   McClung, Dionne Bucy, PA-C  omeprazole (PRILOSEC) 20 MG capsule Take 1 capsule (20 mg total) by mouth daily. 12/19/16   Argentina Donovan, PA-C  ondansetron (ZOFRAN) 8 MG tablet 1/2-1 tablet up to 3 times daily prn nausea 12/19/16   Freeman Caldron M, PA-C  tiZANidine (ZANAFLEX) 4 MG tablet Take 1 tablet (4 mg total) by mouth every 8 (eight) hours as needed for muscle spasms. 10/01/16   Arnoldo Morale, MD  traMADol (ULTRAM) 50 MG tablet Take 1 tablet (50 mg total) by mouth every 12 (twelve) hours as needed. 08/21/16   Arnoldo Morale, MD     Objective:  EXAM:   Vitals:   12/19/16 1416  BP: 104/67  Pulse: 83  Resp: 18  Temp: 98.7 F (37.1 C)  TempSrc: Oral  SpO2: 98%  Weight: 169 lb (76.7 kg)  Height: 5\' 5"  (1.651 m)    General appearance : A&OX3. NAD. Non-toxic-appearing HEENT: Atraumatic and Normocephalic.  PERRLA. EOM intact.  TM clear B. Mouth-MMM, post pharynx WNL w/o erythema, No PND. Neck: supple, no JVD. No cervical lymphadenopathy. No thyromegaly Chest/Lungs:  Breathing-non-labored, Good air entry bilaterally, breath sounds normal without rales, rhonchi, or wheezing  CVS: S1 S2 regular, no murmurs, gallops, rubs  Abdomen: Bowel sounds present, Non tender and not distended with no gaurding, rigidity or rebound. Extremities: Bilateral Lower Ext shows no edema, both legs are warm to touch with = pulse throughout Neurology:  CN II-XII grossly intact, Non focal.   Psych:  TP linear. J/I WNL. Normal speech. Appropriate eye contact and affect.  Skin:  No Rash  Data Review Lab Results  Component Value Date   HGBA1C 5.6 05/18/2014   HGBA1C 5.4 10/12/2012     Assessment & Plan   1. RUQ pain ?consider gallbladder U/S if persistent, but pain is subsiding today.  Definitely non-acute abdomen today.   - Urinalysis Dipstick - Comprehensive metabolic panel - CBC with Differential/Platelet - H. pylori breath test - omeprazole (PRILOSEC) 20 MG capsule; Take 1 capsule (20 mg total) by mouth daily.  Dispense: 30 capsule; Refill: 3 - naproxen (NAPROSYN) 500 MG tablet; Take 1 tablet (500 mg total) by mouth 2  (two) times daily with a meal.  Dispense: 60 tablet; Refill: 0  2. Gastritis and gastroduodenitis - omeprazole (PRILOSEC) 20 MG capsule; Take 1 capsule (20 mg total) by mouth daily.  Dispense: 30 capsule; Refill: 3  3. Nausea - ondansetron (ZOFRAN) 8 MG tablet; 1/2-1 tablet up to 3 times daily prn nausea  Dispense: 20 tablet; Refill: 0  Trace leukocytes and blood in urine-will cover with macrobid and send culture.  Increase water intake.    Patient have been counseled extensively about nutrition and exercise  Return in about 1 month (around 01/19/2017) for Dr Jarold Song, RUQ pain.  The patient was given clear instructions to go to ER or return to medical center if symptoms don't improve, worsen or new problems develop. The patient verbalized understanding. The patient was told to call to get lab results if they haven't heard anything in the next week.     Freeman Caldron, PA-C [facility] and [place], [place]   12/19/2016, 2:40 PM

## 2016-12-20 LAB — CBC WITH DIFFERENTIAL/PLATELET
BASOS: 1 %
Basophils Absolute: 0 10*3/uL (ref 0.0–0.2)
EOS (ABSOLUTE): 0.1 10*3/uL (ref 0.0–0.4)
Eos: 2 %
Hematocrit: 42.8 % (ref 34.0–46.6)
Hemoglobin: 13.9 g/dL (ref 11.1–15.9)
IMMATURE GRANS (ABS): 0 10*3/uL (ref 0.0–0.1)
Immature Granulocytes: 1 %
LYMPHS ABS: 2.3 10*3/uL (ref 0.7–3.1)
Lymphs: 36 %
MCH: 28.2 pg (ref 26.6–33.0)
MCHC: 32.5 g/dL (ref 31.5–35.7)
MCV: 87 fL (ref 79–97)
MONOS ABS: 0.4 10*3/uL (ref 0.1–0.9)
Monocytes: 6 %
NEUTROS ABS: 3.5 10*3/uL (ref 1.4–7.0)
Neutrophils: 54 %
PLATELETS: 193 10*3/uL (ref 150–379)
RBC: 4.93 x10E6/uL (ref 3.77–5.28)
RDW: 14.1 % (ref 12.3–15.4)
WBC: 6.4 10*3/uL (ref 3.4–10.8)

## 2016-12-20 LAB — COMPREHENSIVE METABOLIC PANEL
ALBUMIN: 4.6 g/dL (ref 3.5–5.5)
ALK PHOS: 104 IU/L (ref 39–117)
ALT: 36 IU/L — ABNORMAL HIGH (ref 0–32)
AST: 18 IU/L (ref 0–40)
Albumin/Globulin Ratio: 1.6 (ref 1.2–2.2)
BUN / CREAT RATIO: 19 (ref 9–23)
BUN: 13 mg/dL (ref 6–24)
Bilirubin Total: 0.6 mg/dL (ref 0.0–1.2)
CALCIUM: 9.3 mg/dL (ref 8.7–10.2)
CO2: 23 mmol/L (ref 20–29)
Chloride: 101 mmol/L (ref 96–106)
Creatinine, Ser: 0.67 mg/dL (ref 0.57–1.00)
GFR, EST AFRICAN AMERICAN: 126 mL/min/{1.73_m2} (ref >59)
GFR, EST NON AFRICAN AMERICAN: 110 mL/min/{1.73_m2} (ref >59)
GLOBULIN, TOTAL: 2.8 g/dL (ref 1.5–4.5)
Glucose: 111 mg/dL — ABNORMAL HIGH (ref 65–99)
Potassium: 3.8 mmol/L (ref 3.5–5.2)
SODIUM: 141 mmol/L (ref 134–144)
Total Protein: 7.4 g/dL (ref 6.0–8.5)

## 2016-12-20 LAB — URINE CULTURE

## 2016-12-20 LAB — H. PYLORI BREATH TEST: H pylori Breath Test: NEGATIVE

## 2016-12-23 NOTE — Progress Notes (Signed)
Please inform the patient of all lab work resulting as being normal. Follow up as planned

## 2016-12-25 ENCOUNTER — Telehealth: Payer: Self-pay

## 2016-12-25 NOTE — Telephone Encounter (Signed)
CMA call regarding lab results are normal  Patient verify DOB   Patient was aware and understood

## 2016-12-25 NOTE — Telephone Encounter (Signed)
-----   Message from [place], [place] sent at 12/23/2016  2:31 PM EDT ----- Please inform the patient of all lab work resulting as being normal. Follow up as planned

## 2017-02-11 ENCOUNTER — Ambulatory Visit: Payer: Self-pay | Admitting: Family Medicine

## 2017-04-03 ENCOUNTER — Ambulatory Visit: Payer: Self-pay | Attending: Family Medicine | Admitting: Physician Assistant

## 2017-04-03 VITALS — BP 114/82 | HR 81 | Temp 98.2°F | Resp 16 | Ht 64.17 in | Wt 164.2 lb

## 2017-04-03 DIAGNOSIS — Z789 Other specified health status: Secondary | ICD-10-CM

## 2017-04-03 DIAGNOSIS — N3001 Acute cystitis with hematuria: Secondary | ICD-10-CM

## 2017-04-03 DIAGNOSIS — R1011 Right upper quadrant pain: Secondary | ICD-10-CM

## 2017-04-03 DIAGNOSIS — R8281 Pyuria: Secondary | ICD-10-CM

## 2017-04-03 DIAGNOSIS — Z880 Allergy status to penicillin: Secondary | ICD-10-CM | POA: Insufficient documentation

## 2017-04-03 DIAGNOSIS — N390 Urinary tract infection, site not specified: Secondary | ICD-10-CM

## 2017-04-03 LAB — POCT URINALYSIS DIPSTICK
Bilirubin, UA: NEGATIVE
GLUCOSE UA: NEGATIVE
Nitrite, UA: NEGATIVE
PH UA: 5 (ref 5.0–8.0)
Spec Grav, UA: 1.02 (ref 1.010–1.025)
UROBILINOGEN UA: 0.2 U/dL

## 2017-04-03 MED ORDER — NITROFURANTOIN MONOHYD MACRO 100 MG PO CAPS: 100.0000 mg | ORAL_CAPSULE | Freq: Two times a day (BID) | ORAL | 0 refills | Status: DC

## 2017-04-03 NOTE — Progress Notes (Signed)
Patient stated she been having stomach pain since Sunday and most on the right side down to her hip.   Patient stated it felt like a ball on her stomach.

## 2017-04-03 NOTE — Progress Notes (Signed)
Patient ID: Danielle [name], female   DOB: March 27, 1975, 42 y.o.   MRN: [id number]     Danielle [name], is a 42 y.o. female  [id number]  [id number]  DOB - October 27, 1975  Subjective:  Chief Complaint and HPI: Danielle [name] is a 42 y.o. female here today to RUQ pain over the weekend on Sunday from about 3:30-5am.  She had not eaten anything unusual before the attack.  It awakened her from sleep.  The pain has since subsided for the most part.  She is now just having mild aching in the RUQ area.  No N/V/D.  Pain is similar to what she has been having on and off for over a year now, but more severe.  Appetite is good now.  Pain radiates into R upper back at the level of the lower ribs posteriorly.    +hysterectomy.  No dysuria.  No pelvic pain/vaginal discharge.    [employer] interpreters used.     ROS:   Constitutional:  No f/c, No night sweats, No unexplained weight loss. EENT:  No vision changes, No blurry vision, No hearing changes. No mouth, throat, or ear problems.  Respiratory: No cough, No SOB Cardiac: No CP, no palpitations GI:  + abd pain, No N/V/D. GU: No Urinary s/sx Musculoskeletal: No joint pain Neuro: No headache, no dizziness, no motor weakness.  Skin: No rash Endocrine:  No polydipsia. No polyuria.  Psych: Denies SI/HI  No problems updated.  ALLERGIES: Allergies  Allergen Reactions  . Ampicillin   . Penicillins     REACTION: rash    PAST MEDICAL HISTORY: Past Medical History:  Diagnosis Date  . Back pain     MEDICATIONS AT HOME: Prior to Admission medications   Medication Sig Start Date End Date Taking? Authorizing Provider  naproxen (NAPROSYN) 500 MG tablet Take 1 tablet (500 mg total) by mouth 2 (two) times daily with a meal. 12/19/16  Yes Aceyn Kathol M, PA-C  omeprazole (PRILOSEC) 20 MG capsule Take 1 capsule (20 mg total) by mouth daily. 12/19/16  Yes Cintia Gleed, Dionne Bucy, PA-C  tiZANidine (ZANAFLEX) 4 MG tablet Take 1 tablet (4 mg total)  by mouth every 8 (eight) hours as needed for muscle spasms. 10/01/16  Yes Charlott Rakes, MD  ibuprofen (ADVIL,MOTRIN) 200 MG tablet Take 200 mg by mouth every 6 (six) hours as needed.    [provider]  nitrofurantoin, macrocrystal-monohydrate, (MACROBID) 100 MG capsule Take 1 capsule (100 mg total) by mouth 2 (two) times daily. Patient not taking: Reported on 04/03/2017 12/19/16   Argentina Donovan, PA-C  ondansetron [facility]) 8 MG tablet 1/2-1 tablet up to 3 times daily prn nausea Patient not taking: Reported on 04/03/2017 12/19/16   Argentina Donovan, PA-C  traMADol (ULTRAM) 50 MG tablet Take 1 tablet (50 mg total) by mouth every 12 (twelve) hours as needed. Patient not taking: Reported on 04/03/2017 08/21/16   Charlott Rakes, MD     Objective:  EXAM:   Vitals:   04/03/17 0957 04/03/17 0959  BP: 114/82   Pulse: 81   Resp: 16   Temp: 98.2 F (36.8 C)   TempSrc: Oral   SpO2: 97%   Weight: 164 lb 3.2 oz (74.5 kg) 164 lb 3.2 oz (74.5 kg)  Height: 5' 4.17" (1.63 m)     General appearance : A&OX3. NAD. Non-toxic-appearing HEENT: Atraumatic and Normocephalic.  PERRLA. EOM intact.  TM clear B. Mouth-MMM, post pharynx WNL w/o erythema, No PND. Neck: supple, no JVD. No  cervical lymphadenopathy. No thyromegaly Chest/Lungs:  Breathing-non-labored, Good air entry bilaterally, breath sounds normal without rales, rhonchi, or wheezing  CVS: S1 S2 regular, no murmurs, gallops, rubs  Abdomen: Bowel sounds present, she is mildly tender in the RUQ, +/-Murphy's sign.  No distention, guarding or rebound.  Neg [place].   Extremities: Bilateral Lower Ext shows no edema, both legs are warm to touch with = pulse throughout Neurology:  CN II-XII grossly intact, Non focal.   Psych:  TP linear. J/I WNL. Normal speech. Appropriate eye contact and affect.  Skin:  No Rash  Data Review Lab Results  Component Value Date   HGBA1C 5.6 05/18/2014   HGBA1C 5.4 10/12/2012     Assessment & Plan    1. RUQ pain Non -acute abdomen, but concern for gallbladder disease - Comprehensive metabolic panel - CBC with Differential/Platelet - US Abdomen Limited RUQ; Future - Urinalysis Dipstick To ED if pain returns/worsens at all/ fever/ N/V develop  2. Pyuria-macrobid X 3 days.  Increase water intake.  Send urine for culture.    3. Language barrier-[employer] interpreters used and additional time performing visit was required.   Spent 30 mins face to face with patient and her husband taking history, reviewing previous labs with them, answering questions and providing patient education.   Patient have been counseled extensively about nutrition and exercise  Return in about 3 weeks (around 04/24/2017) for Dr Margarita Rana for recheck RUQ pain.  The patient was given clear instructions to go to ER or return to medical center if symptoms don't improve, worsen or new problems develop. The patient verbalized understanding. The patient was told to call to get lab results if they haven't heard anything in the next week.     Freeman Caldron, PA-C [facility] and [place], [place]   04/03/2017, 10:30 AM

## 2017-04-03 NOTE — Patient Instructions (Signed)
Colelitiasis (Cholelithiasis) La colelitiasis (tambin llamada clculos en la vescula) es una enfermedad de la vescula. La vescula es un rgano pequeo que ayuda a digerir las [place]. Los sntomas de clculos en la vescula son:  Feliberto Harts de vomitar (nuseas).  Devolver la comida (vomitar).  Dolor en el vientre.  Piel amarilla (ictericia)  Dolor sbito. Podr sentir Conservation officer, historic buildings durante algunos minutos o unas horas.  Cristy Hilts.  Dolor a Secretary/administrator. CUIDADOS EN EL HOGAR  Tome slo los medicamentos segn le haya indicado el mdico.  Siga una dieta baja en grasas hasta que vuelva a ver al mdico nuevamente. Consumir grasas puede Engineer, drilling.  Concurra a las consultas de control con el mdico, segn las indicaciones. Los ataques generalmente ocurren repetidas veces. Generalmente se requiere de [employer].  SOLICITE AYUDA DE INMEDIATO SI:  El dolor empeora.  El dolor no se alivia con los [employer].  Tiene fiebre o sntomas que persisten durante ms de 2-3 das.  Tiene fiebre y los sntomas empeoran repentinamente.  Siente nuseas y vomita.  ASEGRESE DE QUE:  Comprende estas instrucciones.  Controlar su afeccin.  Recibir ayuda de inmediato si no mejora o si empeora.  Esta informacin no tiene Marine scientist el consejo del mdico. Asegrese de hacerle al mdico cualquier pregunta que tenga. Document Released: 05/24/2008 Document Revised: 10/28/2012 Document Reviewed: 08/19/2012 Elsevier Interactive Patient Education  2017 [place] con bajo contenido de grasas para la pancreatitis o los trastornos de la vescula (Low-Fat Diet for Pancreatitis or Gallbladder Conditions) Una dieta con bajo contenido de grasas puede ser til si usted tiene pancreatitis o trastornos de la vescula. En estos trastornos, el pncreas y la vescula tienen dificultades para digerir las grasas. Un plan de alimentacin saludable con menos grasas ayudar  a que el pncreas y la vescula descansen, y reducir los sntomas. QU DEBO SABER ACERCA DE ESTA [place]?  Consuma una dieta con bajo contenido de [place]. ? Reduzca el consumo de grasas a menos del 20% al 30% del total de caloras diarias. Esto representa menos de 50a 60gramos de grasas por da. ? Recuerde que la dieta debe incluir algo de grasa. Consulte al nutricionista cul debe ser su meta diaria. ? Elija alimentos saludables sin grasas o con bajo contenido de grasas. Busque las palabras "sin grasa", "bajo en grasas" o "bajo contenido de grasas". ? [employer], mire la etiqueta y elija alimentos con menos de 3gramos de grasa por porcin. Coma solo una porcin.  Evite el alcohol.  No fumar. Si necesita ayuda para dejar de fumar, hable con el mdico.  Haga comidas pequeas y frecuentes en lugar de 3 comidas abundantes y pesadas.  QU ALIMENTOS PUEDO COMER? Cereales Incluya granos y almidones saludables, como papas, pan integral, cereales ricos en fibras y [place] integral. Elija opciones de cereales integrales siempre que sea posible. En los adultos, los cereales integrales deben representar del 45% al 65% de las caloras diarias. Lambert Mody y verduras Coma muchas frutas y verduras. Las frutas y verduras frescas aportan fibra a la dieta. Carnes y otras fuentes de protenas Coma carnes [place], como pollo y 53. Quite la grasa de la carne antes de cocinarla. Los [place], el pescado y los frijoles son otras fuentes de protenas. En los adultos, estos alimentos deben representar entre el 10% y el 35% de las caloras diarias. [place] y otros productos lcteos con bajo contenido de [place]. Los lcteos aportan grasas y protenas, adems de calcio. Grasas y Microbiologist  Limite los alimentos con alto contenido de grasas, como las comidas fritas, los [place], los productos horneados y las bebidas azucaradas. [place] condimentos y las salsas cremosas, como la [place], [place] aportar  grasa adicional. Piense si necesita usarlos o no, o use pequeas cantidades u opciones con bajo contenido de grasas. QU ALIMENTOS NO ESTN RECOMENDADOS?  Los alimentos con alto contenido de [place], por ejemplo: ? Alimentos horneados. ? Helados. ? Tostadas francesas. ? Panecillos dulces. ? Pizza. ? Pan de queso. ? Alimentos rebozados o cubiertos con [place], salsas cremosas o queso. ? Comidas fritas. ? Postres y bebidas azucaradas.  Alimentos que causan gases o meteorismo  Esta informacin no tiene Marine scientist el consejo del mdico. Asegrese de hacerle al mdico cualquier pregunta que tenga. Document Released: 03/02/2013 Document Revised: 03/02/2013 Document Reviewed: 02/08/2013 Elsevier Interactive Patient Education  2017 [employer].

## 2017-04-05 LAB — URINE CULTURE

## 2017-04-06 ENCOUNTER — Encounter (HOSPITAL_COMMUNITY): Payer: Self-pay | Admitting: Emergency Medicine

## 2017-04-06 DIAGNOSIS — Z79899 Other long term (current) drug therapy: Secondary | ICD-10-CM | POA: Insufficient documentation

## 2017-04-06 DIAGNOSIS — Z880 Allergy status to penicillin: Secondary | ICD-10-CM | POA: Insufficient documentation

## 2017-04-06 DIAGNOSIS — K8010 Calculus of gallbladder with chronic cholecystitis without obstruction: Principal | ICD-10-CM | POA: Insufficient documentation

## 2017-04-06 DIAGNOSIS — R1011 Right upper quadrant pain: Secondary | ICD-10-CM | POA: Diagnosis present

## 2017-04-06 DIAGNOSIS — K760 Fatty (change of) liver, not elsewhere classified: Secondary | ICD-10-CM | POA: Diagnosis not present

## 2017-04-06 LAB — COMPREHENSIVE METABOLIC PANEL
ALBUMIN: 4.1 g/dL (ref 3.5–5.0)
ALK PHOS: 100 U/L (ref 38–126)
ALT: 37 U/L (ref 14–54)
ANION GAP: 10 (ref 5–15)
AST: 47 U/L — AB (ref 15–41)
BUN: 9 mg/dL (ref 6–20)
CO2: 22 mmol/L (ref 22–32)
Calcium: 9.1 mg/dL (ref 8.9–10.3)
Chloride: 107 mmol/L (ref 101–111)
Creatinine, Ser: 0.76 mg/dL (ref 0.44–1.00)
GFR calc Af Amer: >60 mL/min (ref >60)
GFR calc non Af Amer: >60 mL/min (ref >60)
GLUCOSE: 103 mg/dL — AB (ref 65–99)
Potassium: 4.4 mmol/L (ref 3.5–5.1)
SODIUM: 139 mmol/L (ref 135–145)
Total Bilirubin: 0.7 mg/dL (ref 0.3–1.2)
Total Protein: 7.3 g/dL (ref 6.5–8.1)

## 2017-04-06 LAB — CBC
HEMATOCRIT: 43.5 % (ref 36.0–46.0)
Hemoglobin: 14.7 g/dL (ref 12.0–15.0)
MCH: 29.4 pg (ref 26.0–34.0)
MCHC: 33.8 g/dL (ref 30.0–36.0)
MCV: 87 fL (ref 78.0–100.0)
Platelets: 186 10*3/uL (ref 150–400)
RBC: 5 MIL/uL (ref 3.87–5.11)
RDW: 12.8 % (ref 11.5–15.5)
WBC: 7 10*3/uL (ref 4.0–10.5)

## 2017-04-06 LAB — I-STAT BETA HCG BLOOD, ED (MC, WL, AP ONLY): I-stat hCG, quantitative: <5 m[IU]/mL (ref <5)

## 2017-04-06 LAB — I-STAT TROPONIN, ED: Troponin i, poc: 0 ng/mL (ref 0.00–0.08)

## 2017-04-06 LAB — LIPASE, BLOOD: Lipase: 40 U/L (ref 11–51)

## 2017-04-06 MED ORDER — ONDANSETRON HCL 4 MG/2ML IJ SOLN
4.0000 mg | Freq: Once | INTRAMUSCULAR | Status: AC | PRN
  Administered 2017-04-06: 4 mg via INTRAVENOUS
  Filled 2017-04-06: qty 2

## 2017-04-06 MED ORDER — FENTANYL CITRATE (PF) 100 MCG/2ML IJ SOLN
50.0000 ug | INTRAMUSCULAR | Status: DC | PRN
  Administered 2017-04-06: 50 ug via INTRAVENOUS
  Filled 2017-04-06: qty 2

## 2017-04-06 NOTE — ED Triage Notes (Signed)
Reports intermittent epigastric pain that radiates into right upper quad.  Seen by PCP for same.  Scheduled to come here on Tuesday for an [place].  Also c/o n/v.  This episode started an hour ago.

## 2017-04-06 NOTE — ED Notes (Signed)
Nurse starting IV and will draw labs. 

## 2017-04-07 ENCOUNTER — Observation Stay (HOSPITAL_COMMUNITY): Payer: Medicaid Other | Admitting: Anesthesiology

## 2017-04-07 ENCOUNTER — Observation Stay (HOSPITAL_COMMUNITY)
Admission: EM | Admit: 2017-04-07 | Discharge: 2017-04-08 | Disposition: A | Discharge status: Home / Self Care | Payer: Medicaid Other | Attending: General Surgery | Admitting: General Surgery

## 2017-04-07 ENCOUNTER — Encounter (HOSPITAL_COMMUNITY): Payer: Self-pay | Admitting: Surgery

## 2017-04-07 ENCOUNTER — Other Ambulatory Visit: Payer: Self-pay

## 2017-04-07 ENCOUNTER — Emergency Department (HOSPITAL_COMMUNITY): Payer: Medicaid Other

## 2017-04-07 ENCOUNTER — Encounter (HOSPITAL_COMMUNITY)
Admission: EM | Disposition: A | Discharge status: Home / Self Care | Payer: Self-pay | Source: Home / Self Care | Attending: Emergency Medicine

## 2017-04-07 DIAGNOSIS — Z880 Allergy status to penicillin: Secondary | ICD-10-CM | POA: Diagnosis not present

## 2017-04-07 DIAGNOSIS — R1011 Right upper quadrant pain: Secondary | ICD-10-CM

## 2017-04-07 DIAGNOSIS — K760 Fatty (change of) liver, not elsewhere classified: Secondary | ICD-10-CM | POA: Diagnosis not present

## 2017-04-07 DIAGNOSIS — K819 Cholecystitis, unspecified: Secondary | ICD-10-CM | POA: Diagnosis present

## 2017-04-07 DIAGNOSIS — K810 Acute cholecystitis: Secondary | ICD-10-CM

## 2017-04-07 DIAGNOSIS — K8010 Calculus of gallbladder with chronic cholecystitis without obstruction: Secondary | ICD-10-CM | POA: Diagnosis not present

## 2017-04-07 DIAGNOSIS — Z79899 Other long term (current) drug therapy: Secondary | ICD-10-CM | POA: Diagnosis not present

## 2017-04-07 HISTORY — PX: CHOLECYSTECTOMY: SHX55

## 2017-04-07 HISTORY — DX: Personal history of other medical treatment: Z92.89

## 2017-04-07 HISTORY — PX: LAPAROSCOPIC CHOLECYSTECTOMY: SUR755

## 2017-04-07 LAB — SURGICAL PCR SCREEN
MRSA, PCR: NEGATIVE
Staphylococcus aureus: POSITIVE — AB

## 2017-04-07 SURGERY — LAPAROSCOPIC CHOLECYSTECTOMY
Anesthesia: General | Site: Abdomen

## 2017-04-07 MED ORDER — ACETAMINOPHEN 325 MG PO TABS: 650.0000 mg | ORAL_TABLET | Freq: Four times a day (QID) | ORAL | Status: DC | PRN

## 2017-04-07 MED ORDER — MENTHOL 3 MG MT LOZG: 1.0000 | LOZENGE | OROMUCOSAL | Status: DC | PRN

## 2017-04-07 MED ORDER — MORPHINE SULFATE (PF) 4 MG/ML IV SOLN
1.0000 mg | INTRAVENOUS | Status: DC | PRN
  Administered 2017-04-07: 2 mg via INTRAVENOUS
  Filled 2017-04-07: qty 1

## 2017-04-07 MED ORDER — SUGAMMADEX SODIUM 200 MG/2ML IV SOLN
INTRAVENOUS | Status: DC | PRN
  Administered 2017-04-07: 200 mg via INTRAVENOUS

## 2017-04-07 MED ORDER — 0.9 % SODIUM CHLORIDE (POUR BTL) OPTIME
TOPICAL | Status: DC | PRN
  Administered 2017-04-07: 1000 mL

## 2017-04-07 MED ORDER — PANTOPRAZOLE SODIUM 40 MG IV SOLR: 40.0000 mg | Freq: Every day | INTRAVENOUS | Status: DC

## 2017-04-07 MED ORDER — SODIUM CHLORIDE 0.9 % IV SOLN
INTRAVENOUS | Status: DC
  Administered 2017-04-07: 21:00:00 via INTRAVENOUS
  Administered 2017-04-07: 125 mL/h via INTRAVENOUS

## 2017-04-07 MED ORDER — METRONIDAZOLE IN NACL 5-0.79 MG/ML-% IV SOLN
500.0000 mg | Freq: Three times a day (TID) | INTRAVENOUS | Status: DC
  Administered 2017-04-07: 500 mg via INTRAVENOUS
  Filled 2017-04-07: qty 100

## 2017-04-07 MED ORDER — PROMETHAZINE HCL 25 MG/ML IJ SOLN: 6.2500 mg | INTRAMUSCULAR | Status: DC | PRN

## 2017-04-07 MED ORDER — OXYCODONE HCL 5 MG PO TABS: 5.0000 mg | ORAL_TABLET | ORAL | Status: DC | PRN

## 2017-04-07 MED ORDER — ONDANSETRON HCL 4 MG/2ML IJ SOLN
INTRAMUSCULAR | Status: AC
  Filled 2017-04-07: qty 2

## 2017-04-07 MED ORDER — DEXAMETHASONE SODIUM PHOSPHATE 10 MG/ML IJ SOLN
INTRAMUSCULAR | Status: DC | PRN
  Administered 2017-04-07: 10 mg via INTRAVENOUS

## 2017-04-07 MED ORDER — MIDAZOLAM HCL 2 MG/2ML IJ SOLN
INTRAMUSCULAR | Status: AC
  Filled 2017-04-07: qty 2

## 2017-04-07 MED ORDER — ONDANSETRON HCL 4 MG/2ML IJ SOLN
4.0000 mg | Freq: Once | INTRAMUSCULAR | Status: AC
  Administered 2017-04-07: 4 mg via INTRAVENOUS
  Filled 2017-04-07: qty 2

## 2017-04-07 MED ORDER — DEXTROSE 5 % IV SOLN
1.0000 g | Freq: Once | INTRAVENOUS | Status: AC
  Administered 2017-04-07: 1 g via INTRAVENOUS
  Filled 2017-04-07: qty 10

## 2017-04-07 MED ORDER — LIDOCAINE HCL (CARDIAC) 20 MG/ML IV SOLN
INTRAVENOUS | Status: DC | PRN
  Administered 2017-04-07: 60 mg via INTRAVENOUS

## 2017-04-07 MED ORDER — SUGAMMADEX SODIUM 200 MG/2ML IV SOLN
INTRAVENOUS | Status: AC
  Filled 2017-04-07: qty 2

## 2017-04-07 MED ORDER — LACTATED RINGERS IV SOLN
INTRAVENOUS | Status: DC
Start: 2017-04-07 — End: 2017-04-08
  Administered 2017-04-07: 09:00:00 via INTRAVENOUS

## 2017-04-07 MED ORDER — METRONIDAZOLE IN NACL 500-0.74 MG/100ML-% IV SOLN
500.0000 mg | INTRAVENOUS | Status: AC
  Administered 2017-04-07: 500 mg via INTRAVENOUS
  Filled 2017-04-07: qty 100

## 2017-04-07 MED ORDER — ACETAMINOPHEN 500 MG PO TABS: 1000.0000 mg | ORAL_TABLET | ORAL | Status: DC

## 2017-04-07 MED ORDER — ESMOLOL HCL 100 MG/10ML IV SOLN
INTRAVENOUS | Status: AC
  Filled 2017-04-07: qty 10

## 2017-04-07 MED ORDER — IBUPROFEN 800 MG PO TABS: 800.0000 mg | ORAL_TABLET | Freq: Three times a day (TID) | ORAL | Status: DC | PRN

## 2017-04-07 MED ORDER — PROPOFOL 10 MG/ML IV BOLUS
INTRAVENOUS | Status: DC | PRN
  Administered 2017-04-07: 150 mg via INTRAVENOUS

## 2017-04-07 MED ORDER — HYDROMORPHONE HCL 1 MG/ML IJ SOLN
INTRAMUSCULAR | Status: AC
  Administered 2017-04-07: 0.5 mg via INTRAVENOUS
  Filled 2017-04-07: qty 1

## 2017-04-07 MED ORDER — MORPHINE SULFATE (PF) 4 MG/ML IV SOLN
4.0000 mg | Freq: Once | INTRAVENOUS | Status: AC
  Administered 2017-04-07: 4 mg via INTRAVENOUS
  Filled 2017-04-07: qty 1

## 2017-04-07 MED ORDER — SODIUM CHLORIDE 0.9 % IV BOLUS (SEPSIS)
1000.0000 mL | Freq: Once | INTRAVENOUS | Status: AC
  Administered 2017-04-07: 1000 mL via INTRAVENOUS

## 2017-04-07 MED ORDER — ONDANSETRON 4 MG PO TBDP
4.0000 mg | ORAL_TABLET | Freq: Four times a day (QID) | ORAL | Status: DC | PRN
Start: 2017-04-07 — End: 2017-04-07

## 2017-04-07 MED ORDER — BUPIVACAINE HCL (PF) 0.25 % IJ SOLN
INTRAMUSCULAR | Status: AC
  Filled 2017-04-07: qty 30

## 2017-04-07 MED ORDER — PROPOFOL 10 MG/ML IV BOLUS
INTRAVENOUS | Status: AC
  Filled 2017-04-07: qty 20

## 2017-04-07 MED ORDER — MORPHINE SULFATE (PF) 4 MG/ML IV SOLN: 4.0000 mg | INTRAVENOUS | Status: DC | PRN

## 2017-04-07 MED ORDER — ROCURONIUM BROMIDE 10 MG/ML (PF) SYRINGE
PREFILLED_SYRINGE | INTRAVENOUS | Status: AC
  Filled 2017-04-07: qty 5

## 2017-04-07 MED ORDER — MIDAZOLAM HCL 5 MG/5ML IJ SOLN
INTRAMUSCULAR | Status: DC | PRN
  Administered 2017-04-07: 2 mg via INTRAVENOUS

## 2017-04-07 MED ORDER — ONDANSETRON HCL 4 MG/2ML IJ SOLN: 4.0000 mg | Freq: Four times a day (QID) | INTRAMUSCULAR | Status: DC | PRN

## 2017-04-07 MED ORDER — BUPIVACAINE-EPINEPHRINE 0.25% -1:200000 IJ SOLN
INTRAMUSCULAR | Status: DC | PRN
Start: 2017-04-07 — End: 2017-04-07
  Administered 2017-04-07: 16 mL

## 2017-04-07 MED ORDER — ROCURONIUM BROMIDE 100 MG/10ML IV SOLN
INTRAVENOUS | Status: DC | PRN
  Administered 2017-04-07: 50 mg via INTRAVENOUS

## 2017-04-07 MED ORDER — ONDANSETRON HCL 4 MG/2ML IJ SOLN
INTRAMUSCULAR | Status: DC | PRN
  Administered 2017-04-07: 4 mg via INTRAVENOUS

## 2017-04-07 MED ORDER — KETOROLAC TROMETHAMINE 30 MG/ML IJ SOLN
INTRAMUSCULAR | Status: AC
  Filled 2017-04-07: qty 1

## 2017-04-07 MED ORDER — FENTANYL CITRATE (PF) 250 MCG/5ML IJ SOLN
INTRAMUSCULAR | Status: AC
  Filled 2017-04-07: qty 5

## 2017-04-07 MED ORDER — LIDOCAINE 2% (20 MG/ML) 5 ML SYRINGE
INTRAMUSCULAR | Status: AC
  Filled 2017-04-07: qty 5

## 2017-04-07 MED ORDER — OXYCODONE HCL 5 MG PO TABS
5.0000 mg | ORAL_TABLET | Freq: Four times a day (QID) | ORAL | Status: DC | PRN
  Administered 2017-04-07 – 2017-04-08 (×3): 5 mg via ORAL
  Filled 2017-04-07 (×3): qty 1

## 2017-04-07 MED ORDER — GABAPENTIN 300 MG PO CAPS: 300.0000 mg | ORAL_CAPSULE | ORAL | Status: DC

## 2017-04-07 MED ORDER — KETOROLAC TROMETHAMINE 30 MG/ML IJ SOLN
30.0000 mg | Freq: Once | INTRAMUSCULAR | Status: DC | PRN
  Administered 2017-04-07: 30 mg via INTRAVENOUS

## 2017-04-07 MED ORDER — FENTANYL CITRATE (PF) 100 MCG/2ML IJ SOLN
INTRAMUSCULAR | Status: DC | PRN
  Administered 2017-04-07: 150 ug via INTRAVENOUS
  Administered 2017-04-07: 50 ug via INTRAVENOUS

## 2017-04-07 MED ORDER — DEXAMETHASONE SODIUM PHOSPHATE 10 MG/ML IJ SOLN
INTRAMUSCULAR | Status: AC
  Filled 2017-04-07: qty 1

## 2017-04-07 MED ORDER — ACETAMINOPHEN 650 MG RE SUPP: 650.0000 mg | Freq: Four times a day (QID) | RECTAL | Status: DC | PRN

## 2017-04-07 MED ORDER — HYDROMORPHONE HCL 1 MG/ML IJ SOLN
0.2500 mg | INTRAMUSCULAR | Status: DC | PRN
  Administered 2017-04-07 (×2): 0.5 mg via INTRAVENOUS

## 2017-04-07 MED ORDER — SODIUM CHLORIDE 0.9 % IR SOLN
Status: DC | PRN
  Administered 2017-04-07: 1000 mL

## 2017-04-07 SURGICAL SUPPLY — 34 items
ADH SKN CLS APL DERMABOND .7 (GAUZE/BANDAGES/DRESSINGS) ×1
BLADE CLIPPER SURG (BLADE) IMPLANT
CANISTER SUCT 3000ML PPV (MISCELLANEOUS) ×3 IMPLANT
CHLORAPREP W/TINT 26ML (MISCELLANEOUS) ×3 IMPLANT
CLIP VESOLOCK XL 6/CT (CLIP) ×6 IMPLANT
COVER SURGICAL LIGHT HANDLE (MISCELLANEOUS) ×3 IMPLANT
DERMABOND ADVANCED (GAUZE/BANDAGES/DRESSINGS) ×2
DERMABOND ADVANCED .7 DNX12 (GAUZE/BANDAGES/DRESSINGS) ×1 IMPLANT
ELECT REM PT RETURN 9FT ADLT (ELECTROSURGICAL) ×3
ELECTRODE REM PT RTRN 9FT ADLT (ELECTROSURGICAL) ×1 IMPLANT
GLOVE BIOGEL PI IND STRL 7.0 (GLOVE) ×1 IMPLANT
GLOVE BIOGEL PI INDICATOR 7.0 (GLOVE) ×2
GLOVE SURG SS PI 7.0 STRL IVOR (GLOVE) ×3 IMPLANT
GOWN STRL REUS W/ TWL LRG LVL3 (GOWN DISPOSABLE) ×3 IMPLANT
GOWN STRL REUS W/TWL LRG LVL3 (GOWN DISPOSABLE) ×6
GRASPER SUT TROCAR 14GX15 (MISCELLANEOUS) ×3 IMPLANT
KIT BASIN OR (CUSTOM PROCEDURE TRAY) ×3 IMPLANT
KIT ROOM TURNOVER OR (KITS) ×3 IMPLANT
NS IRRIG 1000ML POUR BTL (IV SOLUTION) ×3 IMPLANT
PAD ARMBOARD 7.5X6 YLW CONV (MISCELLANEOUS) ×3 IMPLANT
POUCH RETRIEVAL ECOSAC 10 (ENDOMECHANICALS) ×1 IMPLANT
POUCH RETRIEVAL ECOSAC 10MM (ENDOMECHANICALS) ×2
SCISSORS LAP 5X35 DISP (ENDOMECHANICALS) ×3 IMPLANT
SET IRRIG TUBING LAPAROSCOPIC (IRRIGATION / IRRIGATOR) ×3 IMPLANT
SLEEVE ENDOPATH XCEL 5M (ENDOMECHANICALS) ×6 IMPLANT
SPECIMEN JAR SMALL (MISCELLANEOUS) ×3 IMPLANT
SUT MNCRL AB 4-0 PS2 18 (SUTURE) ×3 IMPLANT
TOWEL OR 17X24 6PK STRL BLUE (TOWEL DISPOSABLE) ×3 IMPLANT
TOWEL OR 17X26 10 PK STRL BLUE (TOWEL DISPOSABLE) ×3 IMPLANT
TRAY LAPAROSCOPIC MC (CUSTOM PROCEDURE TRAY) ×3 IMPLANT
TROCAR XCEL 12X100 BLDLESS (ENDOMECHANICALS) ×3 IMPLANT
TROCAR XCEL NON-BLD 5MMX100MML (ENDOMECHANICALS) ×3 IMPLANT
TUBING INSUFFLATION (TUBING) ×3 IMPLANT
WATER STERILE IRR 1000ML POUR (IV SOLUTION) ×3 IMPLANT

## 2017-04-07 NOTE — Anesthesia Procedure Notes (Signed)
Procedure Name: Intubation Date/Time: 04/07/2017 11:01 AM Performed by: Lance Coon, CRNA Pre-anesthesia Checklist: Emergency Drugs available, Patient identified, Suction available, Patient being monitored and Timeout performed Patient Re-evaluated:Patient Re-evaluated prior to induction Oxygen Delivery Method: Circle system utilized Preoxygenation: Pre-oxygenation with 100% oxygen Induction Type: IV induction Ventilation: Mask ventilation without difficulty Laryngoscope Size: Miller and 3 Grade View: Grade I Tube type: Oral Tube size: 7.0 mm Number of attempts: 1 Airway Equipment and Method: Stylet Placement Confirmation: ETT inserted through vocal cords under direct vision,  positive ETCO2 and breath sounds checked- equal and bilateral Secured at: 21 cm Tube secured with: Tape Dental Injury: Teeth and Oropharynx as per pre-operative assessment

## 2017-04-07 NOTE — ED Notes (Signed)
Attempted report. No answer on floor.

## 2017-04-07 NOTE — ED Notes (Signed)
Attempted to call report. RN unaware of pt coming to floor. Will call back in 2 minutes.

## 2017-04-07 NOTE — ED Notes (Signed)
Patient denies pain and is resting comfortably.  

## 2017-04-07 NOTE — ED Provider Notes (Signed)
TIME SEEN: 12:23 AM  CHIEF COMPLAINT: Upper abdominal pain  HPI: Patient is a 42 year old female with no significant past medical history who presents to the emergency department with epigastric and right upper quadrant abdominal pain.  Described as sharp and severe in nature without radiation.  Has had similar symptoms in the past and was scheduled for an outpatient ultrasound this week.  Reports symptoms worsened today.  Unable to tell me any aggravating or alleviating factors at this time.  No chest pain or shortness of breath.  Has had nausea and vomiting.  No diarrhea.  No fever.  No previous abdominal surgery.  ROS: See HPI Constitutional: no fever  Eyes: no drainage  ENT: no runny nose   Cardiovascular:  no chest pain  Resp: no SOB  GI:  vomiting GU: no dysuria Integumentary: no rash  Allergy: no hives  Musculoskeletal: no leg swelling  Neurological: no slurred speech ROS otherwise negative  PAST MEDICAL HISTORY/PAST SURGICAL HISTORY:  Past Medical History:  Diagnosis Date  . Back pain     MEDICATIONS:  Prior to Admission medications   Medication Sig Start Date End Date Taking? Authorizing Provider  ibuprofen (ADVIL,MOTRIN) 200 MG tablet Take 200 mg by mouth every 6 (six) hours as needed.    [provider]  naproxen (NAPROSYN) 500 MG tablet Take 1 tablet (500 mg total) by mouth 2 (two) times daily with a meal. 12/19/16   McClung, Dionne Bucy, PA-C  nitrofurantoin, macrocrystal-monohydrate, (MACROBID) 100 MG capsule Take 1 capsule (100 mg total) by mouth 2 (two) times daily. 04/03/17   Argentina Donovan, PA-C  omeprazole (PRILOSEC) 20 MG capsule Take 1 capsule (20 mg total) by mouth daily. 12/19/16   Argentina Donovan, PA-C  ondansetron (ZOFRAN) 8 MG tablet 1/2-1 tablet up to 3 times daily prn nausea Patient not taking: Reported on 04/03/2017 12/19/16   Argentina Donovan, PA-C  tiZANidine (ZANAFLEX) 4 MG tablet Take 1 tablet (4 mg total) by mouth every 8 (eight) hours as  needed for muscle spasms. 10/01/16   Charlott Rakes, MD  traMADol (ULTRAM) 50 MG tablet Take 1 tablet (50 mg total) by mouth every 12 (twelve) hours as needed. Patient not taking: Reported on 04/03/2017 08/21/16   Charlott Rakes, MD    ALLERGIES:  Allergies  Allergen Reactions  . Ampicillin   . Penicillins     REACTION: rash    SOCIAL HISTORY:  Social History   Tobacco Use  . Smoking status: Never Smoker  . Smokeless tobacco: Never Used  Substance Use Topics  . Alcohol use: No    FAMILY HISTORY: Family History  Problem Relation Age of Onset  . Thyroid disease Mother     EXAM: BP (!) 143/93 (BP Location: Right Arm)   Pulse 85   Temp 98.5 F (36.9 C) (Oral)   Resp 18   Ht 5\' 4"  (1.626 m)   Wt 74.4 kg (164 lb)   SpO2 100%   BMI 28.15 kg/m  CONSTITUTIONAL: Alert and oriented and responds appropriately to questions.  Appears uncomfortable, afebrile, nontoxic HEAD: Normocephalic EYES: Conjunctivae clear, pupils appear equal, EOMI ENT: normal nose; moist mucous membranes NECK: Supple, no meningismus, no nuchal rigidity, no LAD  CARD: RRR; S1 and S2 appreciated; no murmurs, no clicks, no rubs, no gallops RESP: Normal chest excursion without splinting or tachypnea; breath sounds clear and equal bilaterally; no wheezes, no rhonchi, no rales, no hypoxia or respiratory distress, speaking full sentences ABD/GI: Normal bowel sounds; non-distended; soft,  tender in the right upper quadrant with positive Murphy sign, no rebound, no peritoneal signs, no hepatosplenomegaly BACK:  The back appears normal and is non-tender to palpation, there is no CVA tenderness EXT: Normal ROM in all joints; non-tender to palpation; no edema; normal capillary refill; no cyanosis, no calf tenderness or swelling    SKIN: Normal color for age and race; warm; no rash NEURO: Moves all extremities equally PSYCH: The patient's mood and manner are appropriate. Grooming and personal hygiene are  appropriate.  MEDICAL DECISION MAKING: Patient here with right upper quadrant pain.  Concern for cholelithiasis versus cholecystitis.  Differential also includes pancreatitis, colitis.  Less likely bowel obstruction, appendicitis.  Will obtain right upper quadrant ultrasound.  Labs unremarkable with no leukocytosis, elevated LFTs or lipase.  Troponin negative.  I do not think this is her anginal equivalent.  Will give IV fluids, pain and nausea medicine.  ED PROGRESS: Ultrasound shows cholelithiasis with distended gallbladder with increased wall thickening.  She has a positive Murphy sign.  Findings concerning for early, mild acute cholecystitis.  Will give IV ceftriaxone and discuss with general surgery.   2:22 AM Discussed patient's case with general surgery, Dr. Redmond Pulling.  I have recommended admission and patient (and family if present) agree with this plan. Admitting physician will place admission orders.   I reviewed all nursing notes, vitals, pertinent previous records, EKGs, lab and urine results, imaging (as available).      EKG Interpretation  Date/Time:  Sunday April 06 2017 22:04:30 EST Ventricular Rate:  87 PR Interval:  142 QRS Duration: 94 QT Interval:  376 QTC Calculation: 452 R Axis:   99 Text Interpretation:  Normal sinus rhythm with sinus arrhythmia Rightward axis Borderline ECG No significant change since last tracing Confirmed by Elveria Lauderbaugh, Cyril Mourning [id number]) on 04/07/2017 12:23:32 AM         Taneeka Curtner, Delice Bison, DO 04/07/17 0222

## 2017-04-07 NOTE — Progress Notes (Signed)
El[place]ira Interpreter#[id number] used to translate

## 2017-04-07 NOTE — ED Notes (Signed)
No answer on 6N for third time. Will do bedside report.

## 2017-04-07 NOTE — Anesthesia Postprocedure Evaluation (Signed)
Anesthesia Post Note  Patient: Danielle [name]  Procedure(s) Performed: LAPAROSCOPIC CHOLECYSTECTOMY (N/A Abdomen)     Patient location during evaluation: PACU Anesthesia Type: General Level of consciousness: awake and alert Pain management: pain level controlled Vital Signs Assessment: post-procedure vital signs reviewed and stable Respiratory status: spontaneous breathing, nonlabored ventilation, respiratory function stable and patient connected to nasal cannula oxygen Cardiovascular status: blood pressure returned to baseline and stable Postop Assessment: no apparent nausea or vomiting Anesthetic complications: no    Last Vitals:  Vitals:   04/07/17 1230 04/07/17 1246  BP: 127/77 118/84  Pulse: 67 75  Resp: 20 (!) 9  Temp:    SpO2: 99% 91%    Last Pain:  Vitals:   04/07/17 1234  TempSrc:   PainSc: 6                  Teyah Rossy S

## 2017-04-07 NOTE — ED Notes (Signed)
To 6N. No staff to room to take report on pt. Charge RN called by tech and will not come to room. RN with this assignment found at nurse station and report given.

## 2017-04-07 NOTE — Op Note (Signed)
PATIENT:  Danielle [name]  42 y.o. female  PRE-OPERATIVE DIAGNOSIS:  Gallstones  POST-OPERATIVE DIAGNOSIS:  Gallstones  PROCEDURE:  Procedure(s): LAPAROSCOPIC CHOLECYSTECTOMY   SURGEON:  Surgeon(s): Kinsinger, Arta Bruce, MD  ASSISTANT: none  ANESTHESIA:   local and general  Indications for procedure: Danielle [name] is a 42 y.o. female with symptoms of Abdominal pain and Nausea and vomiting consistent with gallbladder disease, Confirmed by Ultrasound.  Description of procedure: The patient was brought into the operative suite, placed supine. Anesthesia was administered with endotracheal tube. Patient was strapped in place and foot board was secured. All pressure points were offloaded by foam padding. The patient was prepped and draped in the usual sterile fashion.  A small incision was made to the right of the umbilicus. A 74mm trocar was inserted into the peritoneal cavity with optical entry. Pneumoperitoneum was applied with high flow low pressure. 2 68mm trocars were placed in the RUQ. A 85mm trocar was placed in the subxiphoid space. All trocars sites were first anesthesized with marcaine with epinephrine in the subcutaneous and preperitoneal layers. Next the patient was placed in reverse trendelenberg. The gallbladder was edematous with some fatty areas.  The gallbladder was retracted cephalad and lateral. The peritoneum was reflected off the infundibulum working lateral to medial. The cystic duct and cystic artery were identified and further dissection revealed a critical view. The cystic duct and cystic artery were doubly clipped and ligated.   The gallbladder was removed off the liver bed with cautery. The Gallbladder was placed in a specimen bag. The gallbladder fossa was irrigated and hemostasis was applied with cautery. The gallbladder was removed via the 41mm trocar. The fascial defect was closed with interrupted 0 vicryl suture via laparoscopic trans-fascial  suture passer. Pneumoperitoneum was removed, all trocar were removed. All incisions were closed with 4-0 monocryl subcuticular stitch. The patient woke from anesthesia and was brought to PACU in stable condition. All counts were correct  Findings: inflamed gallbladder with multiple medium sized stones  Specimen: gallbladder  Blood loss: 30 ml  Local anesthesia: 20 ml marcaine  Complications: none  PLAN OF CARE: Admit to inpatient   PATIENT DISPOSITION:  PACU - hemodynamically stable.  Gurney Maxin, M.D. General, Bariatric, & Minimally Invasive Surgery [facility]

## 2017-04-07 NOTE — H&P (Signed)
[facility] Surgery Consult/Admission Note  Danielle [name] [facility] Aug 06, 1975  [id number].    Requesting MD: Dr. Leonides Schanz Chief Complaint/Reason for Consult: RUQ abdominal pain  HPI:   Pt is a otherwise healthy 42 year old female with a history of C section and abdominal hysterectomy who presented to the [facility] ED with RUQ abdominal pain onset Sunday. Pain progressively worsened to constant, sharp, non radiating. One episode of vomiting with onset of pain, no nausea and + chills during [place]. No other aggravating or alleviating factors. No CP or SOB, dysuria or hematuria, melena or blood in stools. Pt states she was recently treated for a UTI but was asymptomatic. Last meal was yesterday. Labs are unremarkable except for AST 47. Afebrile and no WBC. US showed cholelithiasis, distention, wall thickness 61m, no ductal dilatation.   stratus interpreter used  ROS:  Review of Systems  Constitutional: Positive for chills. Negative for diaphoresis and fever.  HENT: Negative for sore throat.   Respiratory: Negative for cough and shortness of breath.   Cardiovascular: Negative for chest pain.  Gastrointestinal: Positive for abdominal pain and vomiting. Negative for blood in stool, constipation, diarrhea and nausea.  Genitourinary: Negative for dysuria and hematuria.  Skin: Negative for rash.  Neurological: Negative for dizziness and loss of consciousness.  All other systems reviewed and are negative.    Family History  Problem Relation Age of Onset  . Thyroid disease Mother     Past Medical History:  Diagnosis Date  . Back pain     Past Surgical History:  Procedure Laterality Date  . ABDOMINAL HYSTERECTOMY    . CESAREAN SECTION      Social History:  reports that  has never smoked. she has never used smokeless tobacco. She reports that she does not drink alcohol or use drugs.  Allergies:  Allergies  Allergen Reactions  . Ampicillin   . Penicillins     REACTION: rash     Medications Prior to Admission  Medication Sig Dispense Refill  . naproxen (NAPROSYN) 500 MG tablet Take 1 tablet (500 mg total) by mouth 2 (two) times daily with a meal. 60 tablet 0  . tiZANidine (ZANAFLEX) 4 MG tablet Take 1 tablet (4 mg total) by mouth every 8 (eight) hours as needed for muscle spasms. 90 tablet 2  . nitrofurantoin, macrocrystal-monohydrate, (MACROBID) 100 MG capsule Take 1 capsule (100 mg total) by mouth 2 (two) times daily. 6 capsule 0  . omeprazole (PRILOSEC) 20 MG capsule Take 1 capsule (20 mg total) by mouth daily. (Patient not taking: Reported on 04/07/2017) 30 capsule 3  . ondansetron (ZOFRAN) 8 MG tablet 1/2-1 tablet up to 3 times daily prn nausea (Patient not taking: Reported on 04/03/2017) 20 tablet 0  . traMADol (ULTRAM) 50 MG tablet Take 1 tablet (50 mg total) by mouth every 12 (twelve) hours as needed. (Patient not taking: Reported on 04/03/2017) 50 tablet 1    Blood pressure 116/73, pulse 69, temperature 98.4 F (36.9 C), temperature source Oral, resp. rate 17, height '5\' 4"'  (1.626 m), weight 171 lb 15.3 oz (78 kg), SpO2 98 %.  Physical Exam  Constitutional: She is oriented to person, place, and time and well-developed, well-nourished, and in no distress. Vital signs are normal. No distress.  HENT:  Head: Normocephalic and atraumatic.  Nose: Nose normal.  Eyes: Conjunctivae are normal. Pupils are equal, round, and reactive to light. Right eye exhibits no discharge. Left eye exhibits no discharge. No scleral icterus.  Neck: Normal range  of motion. Neck supple.  Cardiovascular: Normal rate, regular rhythm, normal heart sounds and intact distal pulses. Exam reveals no gallop and no friction rub.  No murmur heard. Pulses:      Radial pulses are 2+ on the right side, and 2+ on the left side.  Pulmonary/Chest: Effort normal and breath sounds normal. No respiratory distress. She has no decreased breath sounds. She has no wheezes. She has no rhonchi. She has no  rales.  Abdominal: Soft. Bowel sounds are normal. She exhibits no distension and no mass. There is no hepatosplenomegaly. There is tenderness in the right upper quadrant, epigastric area and left upper quadrant. There is no rebound, no guarding, no tenderness at McBurney's point and negative Murphy's sign.  Musculoskeletal: Normal range of motion. She exhibits no edema or deformity.  Neurological: She is alert and oriented to person, place, and time.  Skin: Skin is warm and dry. No rash noted. She is not diaphoretic.  Psychiatric: Mood and affect normal.  Nursing note and vitals reviewed.   Results for orders placed or performed during the hospital encounter of 04/07/17 (from the past 48 hour(s))  Lipase, blood     Status: None   Collection Time: 04/06/17 10:12 PM  Result Value Ref Range   Lipase 40 11 - 51 U/L  Comprehensive metabolic panel     Status: Abnormal   Collection Time: 04/06/17 10:12 PM  Result Value Ref Range   Sodium 139 135 - 145 mmol/L   Potassium 4.4 3.5 - 5.1 mmol/L   Chloride 107 101 - 111 mmol/L   CO2 22 22 - 32 mmol/L   Glucose, Bld 103 (H) 65 - 99 mg/dL   BUN 9 6 - 20 mg/dL   Creatinine, Ser 0.76 0.44 - 1.00 mg/dL   Calcium 9.1 8.9 - 10.3 mg/dL   Total Protein 7.3 6.5 - 8.1 g/dL   Albumin 4.1 3.5 - 5.0 g/dL   AST 47 (H) 15 - 41 U/L   ALT 37 14 - 54 U/L   Alkaline Phosphatase 100 38 - 126 U/L   Total Bilirubin 0.7 0.3 - 1.2 mg/dL   GFR calc non Af Amer >60 >60 mL/min   GFR calc Af Amer >60 >60 mL/min    Comment: (NOTE) The eGFR has been calculated using the CKD EPI equation. This calculation has not been validated in all clinical situations. eGFR's persistently <60 mL/min signify possible Chronic Kidney Disease.    Anion gap 10 5 - 15  CBC     Status: None   Collection Time: 04/06/17 10:12 PM  Result Value Ref Range   WBC 7.0 4.0 - 10.5 K/uL   RBC 5.00 3.87 - 5.11 MIL/uL   Hemoglobin 14.7 12.0 - 15.0 g/dL   HCT 43.5 36.0 - 46.0 %   MCV 87.0 78.0 -  100.0 fL   MCH 29.4 26.0 - 34.0 pg   MCHC 33.8 30.0 - 36.0 g/dL   RDW 12.8 11.5 - 15.5 %   Platelets 186 150 - 400 K/uL  I-Stat beta hCG blood, ED     Status: None   Collection Time: 04/06/17 10:22 PM  Result Value Ref Range   I-stat hCG, quantitative <5.0 <5 mIU/mL   Comment 3            Comment:   GEST. AGE      CONC.  (mIU/mL)   <=1 WEEK        5 - 50     2 WEEKS  50 - 500     3 WEEKS       100 - 10,000     4 WEEKS     1,000 - 30,000        FEMALE AND NON-PREGNANT FEMALE:     LESS THAN 5 mIU/mL   I-Stat Troponin, ED (not at [facility])     Status: None   Collection Time: 04/06/17 10:24 PM  Result Value Ref Range   Troponin i, poc 0.00 0.00 - 0.08 ng/mL   Comment 3            Comment: Due to the release kinetics of cTnI, a negative result within the first hours of the onset of symptoms does not rule out myocardial infarction with certainty. If myocardial infarction is still suspected, repeat the test at appropriate intervals.    US Abdomen Limited Ruq  Result Date: 04/07/2017 CLINICAL DATA:  Right upper quadrant pain EXAM: ULTRASOUND ABDOMEN LIMITED RIGHT UPPER QUADRANT COMPARISON:  None. FINDINGS: Gallbladder: Gallbladder is moderately distended and contains multiple stones, largest stone measuring 1.6 cm. Gallbladder wall appears mildly thickened with a demonstrated measurement of 5 mm thickness. No pericholecystic fluid identified. Sonographer indicates a positive sonographic Murphy's sign elicited during the exam. Common bile duct: Diameter: 4 mm Liver: No focal lesion identified. Liver is diffusely echogenic indicating fatty infiltration. Portal vein is patent on color Doppler imaging with normal direction of blood flow towards the liver. IMPRESSION: 1. Cholelithiasis. 2. Gallbladder is moderately distended. Gallbladder wall thickness is mildly prominent with a demonstrated measurement of 5 mm. In the setting of right upper quadrant pain, this is suspicious for an early or mild  acute cholecystitis. Consider nuclear medicine HIDA scan for confirmation. 3. No bile duct dilatation. 4. Fatty infiltration of the liver. Electronically Signed   By: Franki Cabot M.D.   On: 04/07/2017 01:46      Assessment/Plan  Cholelithiasis, possible cholecystitis - admit, NPO, OR today - surgery discussed with pt and family via [place] interpreter.   Thank you for the consult.   Kalman Drape, [facility] 04/07/2017, 8:12 AM Pager: [phone] Consults: [phone] Mon-Fri 7:00 am-4:30 pm Sat-Sun 7:00 am-11:30 am

## 2017-04-07 NOTE — Transfer of Care (Signed)
Immediate Anesthesia Transfer of Care Note  Patient: Danielle [name]  Procedure(s) Performed: LAPAROSCOPIC CHOLECYSTECTOMY (N/A Abdomen)  Patient Location: PACU  Anesthesia Type:General  Level of Consciousness: awake and patient cooperative  Airway & Oxygen Therapy: Patient Spontanous Breathing  Post-op Assessment: Report given to RN and Post -op Vital signs reviewed and stable  Post vital signs: Reviewed and stable  Last Vitals:  Vitals:   04/07/17 0656 04/07/17 1200  BP: 116/73   Pulse: 69 70  Resp: 17 12  Temp: 36.9 C   SpO2: 98% 99%    Last Pain:  Vitals:   04/07/17 0715  TempSrc:   PainSc: 4          Complications: No apparent anesthesia complications

## 2017-04-07 NOTE — Anesthesia Preprocedure Evaluation (Signed)
Anesthesia Evaluation  Patient identified by MRN, date of birth, ID band Patient awake    Reviewed: Allergy & Precautions, NPO status , Patient's Chart, lab work & pertinent test results  Airway Mallampati: II  TM Distance: >3 FB Neck ROM: Full    Dental no notable dental hx.    Pulmonary neg pulmonary ROS,    Pulmonary exam normal breath sounds clear to auscultation       Cardiovascular negative cardio ROS Normal cardiovascular exam Rhythm:Regular Rate:Normal     Neuro/Psych negative neurological ROS  negative psych ROS   GI/Hepatic negative GI ROS, Neg liver ROS,   Endo/Other  negative endocrine ROS  Renal/GU negative Renal ROS  negative genitourinary   Musculoskeletal negative musculoskeletal ROS (+)   Abdominal   Peds negative pediatric ROS (+)  Hematology negative hematology ROS (+)   Anesthesia Other Findings   Reproductive/Obstetrics negative OB ROS                             Anesthesia Physical Anesthesia Plan  ASA: I  Anesthesia Plan: General   Post-op Pain Management:    Induction: Intravenous  PONV Risk Score and Plan: 3 and Ondansetron, Dexamethasone, Midazolam and Treatment may vary due to age or medical condition  Airway Management Planned: Oral ETT  Additional Equipment:   Intra-op Plan:   Post-operative Plan: Extubation in OR  Informed Consent: I have reviewed the patients History and Physical, chart, labs and discussed the procedure including the risks, benefits and alternatives for the proposed anesthesia with the patient or authorized representative who has indicated his/her understanding and acceptance.     Dental advisory given  Plan Discussed with: CRNA and Surgeon  Anesthesia Plan Comments:         Anesthesia Quick Evaluation  

## 2017-04-08 ENCOUNTER — Ambulatory Visit (HOSPITAL_COMMUNITY): Payer: Self-pay

## 2017-04-08 ENCOUNTER — Encounter (HOSPITAL_COMMUNITY): Payer: Self-pay | Admitting: General Surgery

## 2017-04-08 LAB — HIV ANTIBODY (ROUTINE TESTING W REFLEX): HIV SCREEN 4TH GENERATION: NONREACTIVE

## 2017-04-08 MED ORDER — OXYCODONE HCL 5 MG PO TABS: 5.0000 mg | ORAL_TABLET | Freq: Four times a day (QID) | ORAL | 0 refills | Status: DC | PRN

## 2017-04-08 NOTE — Progress Notes (Signed)
Pt discharged home in stable condition after going over discharge instructions using the video interpreter with no concerns voiced. AVS and discharge script given before leaving unit

## 2017-04-08 NOTE — Discharge Instructions (Signed)
CIRUGIA LAPAROSCOPICA: INSTRUCCIONES DE POST OPERATORIO. ° °Revise siempre los documentos que le entreguen en el lugar donde se ha hecho la cirugia. ° °SI USTED NECESITA DOCUMENTOS DE INCAPACIDAD (DISABLE) O DE PERMISO FAMILAR (FAMILY LEAVE) NECESITA TRAERLOS A LA OFICINA PARA QUE SEAN PROCESADOS. °NO  SE LOS DE A SU DOCTOR. °1. A su alta del hospital se le dara una receta para controlar el dolor. Tomela como ha sido recetada, si la necesita. Si no la necesita puede tomar, Acetaminofen (Tylenol) o Ibuprofen (Advil) para aliviar dolor moderado. °2. Continue tomando el resto de sus medicinas. °3. Si necesita rellenar la receta, llame a la farmacia. ellos contactan a nuestra oficina pidiendo autorizacion. Este tipo de receta no pueden ser rellenadas despues de las  5pm o durante los fines de semana. °4. Con relacion a la dieta: debe ser ligera los primeros dias despues que llege a la casa. Ejemplo: sopas y galleticas. Tome bastante liquido esos dias. °5. La mayoria de los pacientes padecen de inflamacion y cambio de coloracion de la piel alrededor de las incisiones. esto toma dias en resolver.  pnerse una bolsa de hielo en el area affectada ayuda..  °6. Es comun tambien tener un poco de estrenimiento si esta tomado medicinas para el dolor. incremente la cantidad de liquidos a tomar y puede tomar (Colace) esto previene el problema. Si ya tiene estrenimiento, es decir no ha defecado en 48 horas, puede tomar un laxativo (Milk of Magnesia or Miralax) uselo como el paquete le explica. °7.  A menos que se le diga algo diferente. Remueva el bendaje a las 24-48 horas despues dela cirugia. y puede banarse en la ducha sin ningun problema. usted puede tener steri-strips (pequenas curitas transparentes en la piel puesta encima de la incision)  Estas banditas strips should be left on the skin for 7-10 days.   Si su cirujano puso pegamento encima de la incision usted puede banarse bajo la ducha en 24 horas. Este pegamento empezara a  caerse en las proximas 2-3 semanas. Si le pusieron suturas o presillas (grapos) estos seran quitados en su proxima cita en la oficina. . °a. ACTIVIDADES:  Puede hacer actividad ligera.  Como caminar , subir escaleras y poco a poco irlas incrementando tanto como las tolere. Puede tener relaciones sexuales cuando sea comfortable. No carge objetos pesados o haga esfuerzos que no sean aprovados por su doctor. °b. Puede manejar en cuanto no esta tomando medicamentos fuertes (narcoticos) para el dolor, pueda abrochar confortablemente el cinturon de seguridad, y pueda maniobrar y usar los pedales de su vehiculo con seguridad. °c. PUEDE REGRESAR A TRABAJAR  °8. Debe ver a su doctor para una cita de seguimiento en 2-3 semanas despues de la cirugia.  °9. OTRAS ISNSTRUCCIONES:___________________________________________________________________________________ °CUANDO LLAMAR A SU MEDICO: °1. FIEBRE mayor de  101.0 °2. No produccion de orina. °3. Sangramiento continue de la herida °4. Incremento de dolor, enrojecimientio o drenaje de la herida (incision) °5. Incremento de dolor abdominal. ° °The clinic staff is available to answer your questions during regular business hours.  Please don’t hesitate to call and ask to speak to one of the nurses for clinical concerns.  If you have a medical emergency, go to the nearest emergency room or call 911.  A surgeon from [facility] is always on call at the hospital. °[address], [address], [place], [place]  [address] ? [address] [address], [place], [place]   [address] °[phone] ? [phone] ? FAX [phone] °Web site: [email or web address] ° ° °

## 2017-04-08 NOTE — Discharge Summary (Signed)
[facility] Surgery/Trauma Discharge Summary   Patient ID: Danielle [name] MRN: [id number] DOB/AGE: 1975-03-29 42 y.o.  Admit date: 04/07/2017 Discharge date: 04/08/2017  Admitting Diagnosis: Symptomatic Cholelithiasis   Discharge Diagnosis Patient Active Problem List   Diagnosis Date Noted  . Cholecystitis 04/07/2017  . Gastritis and gastroduodenitis 02/20/2016  . Back pain 09/21/2012  . UTI 09/27/2009  . BACK PAIN 09/27/2009  . URINALYSIS, ABNORMAL 09/27/2009  . VERTIGO 02/24/2009  . SORE THROAT 10/07/2008  . EXTERNAL HEMORRHOIDS 07/11/2008  . RECTAL FISSURE 07/11/2008  . DEHYDRATION 06/29/2008  . PYELONEPHRITIS 06/29/2008  . HEADACHE 06/29/2008    Consultants none  Imaging: US Abdomen Limited Ruq  Result Date: 04/07/2017 CLINICAL DATA:  Right upper quadrant pain EXAM: ULTRASOUND ABDOMEN LIMITED RIGHT UPPER QUADRANT COMPARISON:  None. FINDINGS: Gallbladder: Gallbladder is moderately distended and contains multiple stones, largest stone measuring 1.6 cm. Gallbladder wall appears mildly thickened with a demonstrated measurement of 5 mm thickness. No pericholecystic fluid identified. Sonographer indicates a positive sonographic Murphy's sign elicited during the exam. Common bile duct: Diameter: 4 mm Liver: No focal lesion identified. Liver is diffusely echogenic indicating fatty infiltration. Portal vein is patent on color Doppler imaging with normal direction of blood flow towards the liver. IMPRESSION: 1. Cholelithiasis. 2. Gallbladder is moderately distended. Gallbladder wall thickness is mildly prominent with a demonstrated measurement of 5 mm. In the setting of right upper quadrant pain, this is suspicious for an early or mild acute cholecystitis. Consider nuclear medicine HIDA scan for confirmation. 3. No bile duct dilatation. 4. Fatty infiltration of the liver. Electronically Signed   By: Franki Cabot M.D.   On: 04/07/2017 01:46    Procedures Dr. Kieth Brightly  (04/07/16) - Laparoscopic Cholecystectomy   HPI: Pt is a otherwise healthy 42 year old female with a history of C section and abdominal hysterectomy who presented to the [facility] ED with RUQ abdominal pain onset Sunday. Pain progressively worsened to constant, sharp, non radiating. One episode of vomiting with onset of pain, no nausea and + chills during [place]. No other aggravating or alleviating factors. No CP or SOB, dysuria or hematuria, melena or blood in stools. Pt states she was recently treated for a UTI but was asymptomatic. Last meal was yesterday. Labs are unremarkable except for AST 47. Afebrile and no WBC. US showed cholelithiasis, distention, wall thickness 9mm, no ductal dilatation. Stratus interpreter was used.   Hospital Course:  Patient was admitted and underwent procedure listed above.  Tolerated procedure well and was transferred to the floor.  Diet was advanced as tolerated.  On POD#1, the patient was voiding well, tolerating diet, ambulating well, pain well controlled, vital signs stable, incisions c/d/i and felt stable for discharge home.  Patient will follow up in our office in 2 weeks and knows to call with questions or concerns.  She will call to confirm appointment date/time.    Patient was discharged in good condition.  The [place] Substance controlled database was reviewed prior to prescribing narcotic pain medication to this patient.  Physical Exam: General:  Alert, NAD, pleasant, cooperative Cardio: RRR, S1 & S2 normal, no murmur, rubs, gallops Resp: Effort normal, lungs CTA bilaterally, no wheezes, rales, rhonchi Abd:  Soft, ND, normal bowel sounds, mild TTP around incisions without guarding, incisions with glue intact are without drainage or signs of infection  Skin: no rashes noted, warm and dry  Allergies as of 04/08/2017      Reactions   Ampicillin    Penicillins  REACTION: rash      Medication List    TAKE these medications   naproxen 500 MG  tablet Commonly known as:  NAPROSYN Take 1 tablet (500 mg total) by mouth 2 (two) times daily with a meal.   nitrofurantoin (macrocrystal-monohydrate) 100 MG capsule Commonly known as:  MACROBID Take 1 capsule (100 mg total) by mouth 2 (two) times daily.   omeprazole 20 MG capsule Commonly known as:  PRILOSEC Take 1 capsule (20 mg total) by mouth daily.   ondansetron 8 MG tablet Commonly known as:  ZOFRAN 1/2-1 tablet up to 3 times daily prn nausea   oxyCODONE 5 MG immediate release tablet Commonly known as:  Oxy IR/ROXICODONE Take 1 tablet (5 mg total) by mouth every 6 (six) hours as needed for severe pain.   tiZANidine 4 MG tablet Commonly known as:  ZANAFLEX Take 1 tablet (4 mg total) by mouth every 8 (eight) hours as needed for muscle spasms.   traMADol 50 MG tablet Commonly known as:  ULTRAM Take 1 tablet (50 mg total) by mouth every 12 (twelve) hours as needed.        Follow-up [facility], [place]. Call.   Specialty:  General Surgery Why:  to confirm appointment date and time Contact information: [address] [place] [name] [phone]          Signed: [facility] 04/08/2017, 10:40 AM Pager: [phone] Consults: [phone] Mon-Fri 7:00 am-4:30 pm Sat-Sun 7:00 am-11:30 am

## 2017-04-09 ENCOUNTER — Telehealth: Payer: Self-pay

## 2017-04-09 NOTE — Telephone Encounter (Signed)
-----   Message from [place] Donovan, [place] sent at 04/09/2017 10:11 AM EST ----- Please call patient and check status.  It looks as though she was hospitalized since her last visit for her gallbladder.  Her urine did show infection and we need to recheck a urine at her hospital follow-up.  If she is having urinary symptoms at all, have her come in for a urine check.   Thanks, Freeman Caldron, PA-C

## 2017-04-10 NOTE — Telephone Encounter (Signed)
-----   Message from [place] Donovan, [place] sent at 04/09/2017 10:11 AM EST ----- Please call patient and check status.  It looks as though she was hospitalized since her last visit for her gallbladder.  Her urine did show infection and we need to recheck a urine at her hospital follow-up.  If she is having urinary symptoms at all, have her come in for a urine check.   Thanks, Freeman Caldron, PA-C

## 2017-04-10 NOTE — Progress Notes (Signed)
Patient stated she is feeling the same.  No urinary symptoms and understood urine will be re-check her next visit.

## 2017-04-11 ENCOUNTER — Encounter (HOSPITAL_COMMUNITY): Payer: Self-pay | Admitting: *Deleted

## 2017-04-11 ENCOUNTER — Other Ambulatory Visit: Payer: Self-pay

## 2017-04-11 ENCOUNTER — Emergency Department (HOSPITAL_COMMUNITY): Payer: Medicaid Other

## 2017-04-11 ENCOUNTER — Inpatient Hospital Stay (HOSPITAL_COMMUNITY)
Admission: EM | Admit: 2017-04-11 | Discharge: 2017-04-13 | DRG: 948 | Disposition: A | Discharge status: Home / Self Care | Payer: Medicaid Other | Attending: General Surgery | Admitting: General Surgery

## 2017-04-11 ENCOUNTER — Inpatient Hospital Stay (HOSPITAL_COMMUNITY): Payer: Medicaid Other

## 2017-04-11 DIAGNOSIS — R7989 Other specified abnormal findings of blood chemistry: Secondary | ICD-10-CM | POA: Diagnosis present

## 2017-04-11 DIAGNOSIS — R1011 Right upper quadrant pain: Secondary | ICD-10-CM

## 2017-04-11 DIAGNOSIS — R17 Unspecified jaundice: Secondary | ICD-10-CM | POA: Diagnosis present

## 2017-04-11 DIAGNOSIS — K5900 Constipation, unspecified: Secondary | ICD-10-CM | POA: Diagnosis present

## 2017-04-11 DIAGNOSIS — G8918 Other acute postprocedural pain: Principal | ICD-10-CM | POA: Diagnosis present

## 2017-04-11 DIAGNOSIS — Z90710 Acquired absence of both cervix and uterus: Secondary | ICD-10-CM | POA: Diagnosis not present

## 2017-04-11 LAB — COMPREHENSIVE METABOLIC PANEL
ALBUMIN: 3.9 g/dL (ref 3.5–5.0)
ALK PHOS: 180 U/L — AB (ref 38–126)
ALT: 265 U/L — AB (ref 14–54)
ANION GAP: 11 (ref 5–15)
AST: 343 U/L — ABNORMAL HIGH (ref 15–41)
BILIRUBIN TOTAL: 1.9 mg/dL — AB (ref 0.3–1.2)
BUN: 12 mg/dL (ref 6–20)
CALCIUM: 9.1 mg/dL (ref 8.9–10.3)
CO2: 24 mmol/L (ref 22–32)
CREATININE: 0.74 mg/dL (ref 0.44–1.00)
Chloride: 104 mmol/L (ref 101–111)
GFR calc Af Amer: >60 mL/min (ref >60)
GFR calc non Af Amer: >60 mL/min (ref >60)
GLUCOSE: 146 mg/dL — AB (ref 65–99)
Potassium: 3.6 mmol/L (ref 3.5–5.1)
Sodium: 139 mmol/L (ref 135–145)
TOTAL PROTEIN: 7.4 g/dL (ref 6.5–8.1)

## 2017-04-11 LAB — CBC
HEMATOCRIT: 43.1 % (ref 36.0–46.0)
Hemoglobin: 14.7 g/dL (ref 12.0–15.0)
MCH: 29.5 pg (ref 26.0–34.0)
MCHC: 34.1 g/dL (ref 30.0–36.0)
MCV: 86.5 fL (ref 78.0–100.0)
PLATELETS: 186 10*3/uL (ref 150–400)
RBC: 4.98 MIL/uL (ref 3.87–5.11)
RDW: 12.9 % (ref 11.5–15.5)
WBC: 8 10*3/uL (ref 4.0–10.5)

## 2017-04-11 LAB — URINALYSIS, ROUTINE W REFLEX MICROSCOPIC
Bilirubin Urine: NEGATIVE
Glucose, UA: NEGATIVE mg/dL
KETONES UR: NEGATIVE mg/dL
LEUKOCYTES UA: NEGATIVE
Nitrite: NEGATIVE
PH: 7.5 (ref 5.0–8.0)
PROTEIN: NEGATIVE mg/dL
Specific Gravity, Urine: 1.01 (ref 1.005–1.030)

## 2017-04-11 LAB — I-STAT CG4 LACTIC ACID, ED
LACTIC ACID, VENOUS: 1.06 mmol/L (ref 0.5–1.9)
Lactic Acid, Venous: 2.25 mmol/L (ref 0.5–1.9)

## 2017-04-11 LAB — URINALYSIS, MICROSCOPIC (REFLEX)

## 2017-04-11 LAB — LIPASE, BLOOD: Lipase: 29 U/L (ref 11–51)

## 2017-04-11 MED ORDER — LORAZEPAM 2 MG/ML IJ SOLN
1.0000 mg | Freq: Once | INTRAMUSCULAR | Status: AC
Start: 2017-04-11 — End: 2017-04-11
  Administered 2017-04-11: 1 mg via INTRAVENOUS
  Filled 2017-04-11: qty 1

## 2017-04-11 MED ORDER — ONDANSETRON 4 MG PO TBDP: 4.0000 mg | ORAL_TABLET | Freq: Four times a day (QID) | ORAL | Status: DC | PRN

## 2017-04-11 MED ORDER — IOPAMIDOL (ISOVUE-300) INJECTION 61%
INTRAVENOUS | Status: AC
  Administered 2017-04-11: 100 mL
  Filled 2017-04-11: qty 100

## 2017-04-11 MED ORDER — ONDANSETRON HCL 4 MG/2ML IJ SOLN
4.0000 mg | Freq: Once | INTRAMUSCULAR | Status: AC
  Administered 2017-04-11: 4 mg via INTRAVENOUS
  Filled 2017-04-11: qty 2

## 2017-04-11 MED ORDER — KCL IN DEXTROSE-NACL 20-5-0.45 MEQ/L-%-% IV SOLN
INTRAVENOUS | Status: DC
  Administered 2017-04-11 – 2017-04-12 (×2): via INTRAVENOUS
  Filled 2017-04-11 (×2): qty 1000

## 2017-04-11 MED ORDER — HYDROCODONE-ACETAMINOPHEN 5-325 MG PO TABS: 1.0000 | ORAL_TABLET | ORAL | Status: DC | PRN

## 2017-04-11 MED ORDER — MORPHINE SULFATE (PF) 4 MG/ML IV SOLN
2.0000 mg | INTRAVENOUS | Status: DC | PRN
  Administered 2017-04-11: 4 mg via INTRAVENOUS
  Filled 2017-04-11: qty 1

## 2017-04-11 MED ORDER — DIPHENHYDRAMINE HCL 25 MG PO CAPS: 25.0000 mg | ORAL_CAPSULE | Freq: Four times a day (QID) | ORAL | Status: DC | PRN

## 2017-04-11 MED ORDER — HYDROMORPHONE HCL 1 MG/ML IJ SOLN
1.0000 mg | Freq: Once | INTRAMUSCULAR | Status: AC
  Administered 2017-04-11: 1 mg via INTRAVENOUS
  Filled 2017-04-11: qty 1

## 2017-04-11 MED ORDER — KETOROLAC TROMETHAMINE 30 MG/ML IJ SOLN: 30.0000 mg | Freq: Four times a day (QID) | INTRAMUSCULAR | Status: DC | PRN

## 2017-04-11 MED ORDER — TIZANIDINE HCL 4 MG PO TABS
4.0000 mg | ORAL_TABLET | Freq: Three times a day (TID) | ORAL | Status: DC | PRN
  Filled 2017-04-11: qty 1

## 2017-04-11 MED ORDER — DIPHENHYDRAMINE HCL 50 MG/ML IJ SOLN
25.0000 mg | Freq: Four times a day (QID) | INTRAMUSCULAR | Status: DC | PRN
Start: 2017-04-11 — End: 2017-04-13

## 2017-04-11 MED ORDER — GADOBENATE DIMEGLUMINE 529 MG/ML IV SOLN
15.0000 mL | Freq: Once | INTRAVENOUS | Status: AC
  Administered 2017-04-11: 15 mL via INTRAVENOUS

## 2017-04-11 MED ORDER — METOPROLOL TARTRATE 5 MG/5ML IV SOLN: 5.0000 mg | Freq: Four times a day (QID) | INTRAVENOUS | Status: DC | PRN

## 2017-04-11 MED ORDER — IOPAMIDOL (ISOVUE-300) INJECTION 61%
INTRAVENOUS | Status: AC
  Filled 2017-04-11: qty 30

## 2017-04-11 MED ORDER — POLYETHYLENE GLYCOL 3350 17 G PO PACK: 17.0000 g | PACK | Freq: Every day | ORAL | Status: DC | PRN

## 2017-04-11 MED ORDER — PANTOPRAZOLE SODIUM 40 MG PO TBEC: 40.0000 mg | DELAYED_RELEASE_TABLET | Freq: Every day | ORAL | Status: DC

## 2017-04-11 MED ORDER — ONDANSETRON HCL 4 MG/2ML IJ SOLN: 4.0000 mg | Freq: Four times a day (QID) | INTRAMUSCULAR | Status: DC | PRN

## 2017-04-11 MED ORDER — NAPROXEN 250 MG PO TABS: 500.0000 mg | ORAL_TABLET | Freq: Two times a day (BID) | ORAL | Status: DC | PRN

## 2017-04-11 MED ORDER — SIMETHICONE 80 MG PO CHEW: 40.0000 mg | CHEWABLE_TABLET | Freq: Four times a day (QID) | ORAL | Status: DC | PRN

## 2017-04-11 NOTE — ED Notes (Signed)
General Surgeon at the bedside

## 2017-04-11 NOTE — ED Triage Notes (Addendum)
Pt had recent gallbladder surgery on Monday. Onset this am of severe stabbing pain to her abd. Denies fever, n/v/d. Appears very uncomfortable at triage.

## 2017-04-11 NOTE — ED Provider Notes (Addendum)
Patient had laparoscopic cholecystectomy performed 04/07/2017.  She complains of right upper quadrant pain and epigastric pain which is severe.  Patient is alert Glasgow Coma Score 15 she has tenderness over epigastrium and right upper quadrant.  Pain is improved since treatment with intravenous hydromorphone and Zofran here   Orlie Dakin, MD 04/11/17 1224    Orlie Dakin, MD 04/11/17 1225

## 2017-04-11 NOTE — ED Notes (Signed)
Pt returned from CT °

## 2017-04-11 NOTE — H&P (Signed)
Danielle [name] is an 42 y.o. female.   Chief Complaint: abdominal pain HPI: 42 yo female POD 4 from lap chole. She states this were going ok but today she has much worse epigastric pain. Pain is constant and occasionally shoots down her right side. She has nausea but no vomiting. She has not had a bowel movement since surgery. She denies fevers or chills.  Past Medical History:  Diagnosis Date  . History of blood transfusion 2007   "related to OR"    Past Surgical History:  Procedure Laterality Date  . ABDOMINAL HYSTERECTOMY  2007  . CESAREAN SECTION  2007  . CHOLECYSTECTOMY N/A 04/07/2017   Procedure: LAPAROSCOPIC CHOLECYSTECTOMY;  Surgeon: Jet Armbrust, Arta Bruce, MD;  Location: [facility];  Service: General;  Laterality: N/A;  . LAPAROSCOPIC CHOLECYSTECTOMY  04/07/2017    Family History  Problem Relation Age of Onset  . Thyroid disease Mother    Social History:  reports that  has never smoked. she has never used smokeless tobacco. She reports that she does not drink alcohol or use drugs.  Allergies:  Allergies  Allergen Reactions  . Ampicillin Rash    Has patient had a PCN reaction causing immediate rash, facial/tongue/throat swelling, SOB or lightheadedness with hypotension: Yes Has patient had a PCN reaction causing severe rash involving mucus membranes or skin necrosis: Unk Has patient had a PCN reaction that required hospitalization: No Has patient had a PCN reaction occurring within the last 10 years: No If all of the above answers are "NO", then may proceed with Cephalosporin use.   Marland Kitchen Penicillins Rash    Has patient had a PCN reaction causing immediate rash, facial/tongue/throat swelling, SOB or lightheadedness with hypotension: Yes Has patient had a PCN reaction causing severe rash involving mucus membranes or skin necrosis: Unk Has patient had a PCN reaction that required hospitalization: No Has patient had a PCN reaction occurring within the last 10 years: No If  all of the above answers are "NO", then may proceed with Cephalosporin use.      (Not in a hospital admission)  Results for orders placed or performed during the hospital encounter of 04/11/17 (from the past 48 hour(s))  Lipase, blood     Status: None   Collection Time: 04/11/17 10:00 AM  Result Value Ref Range   Lipase 29 11 - 51 U/L    Comment: Performed at [facility], [address] [place], [place] [address]  Comprehensive metabolic panel     Status: Abnormal   Collection Time: 04/11/17 10:00 AM  Result Value Ref Range   Sodium 139 135 - 145 mmol/L   Potassium 3.6 3.5 - 5.1 mmol/L   Chloride 104 101 - 111 mmol/L   CO2 24 22 - 32 mmol/L   Glucose, Bld 146 (H) 65 - 99 mg/dL   BUN 12 6 - 20 mg/dL   Creatinine, Ser 0.74 0.44 - 1.00 mg/dL   Calcium 9.1 8.9 - 10.3 mg/dL   Total Protein 7.4 6.5 - 8.1 g/dL   Albumin 3.9 3.5 - 5.0 g/dL   AST 343 (H) 15 - 41 U/L    Comment: RESULTS CONFIRMED BY MANUAL DILUTION   ALT 265 (H) 14 - 54 U/L   Alkaline Phosphatase 180 (H) 38 - 126 U/L   Total Bilirubin 1.9 (H) 0.3 - 1.2 mg/dL   GFR calc non Af Amer >60 >60 mL/min   GFR calc Af Amer >60 >60 mL/min    Comment: (NOTE)  The eGFR has been calculated using the CKD EPI equation. This calculation has not been validated in all clinical situations. eGFR's persistently <60 mL/min signify possible Chronic Kidney Disease.    Anion gap 11 5 - 15    Comment: Performed at [facility] [address], [place] [address]  CBC     Status: None   Collection Time: 04/11/17 10:00 AM  Result Value Ref Range   WBC 8.0 4.0 - 10.5 K/uL   RBC 4.98 3.87 - 5.11 MIL/uL   Hemoglobin 14.7 12.0 - 15.0 g/dL   HCT 43.1 36.0 - 46.0 %   MCV 86.5 78.0 - 100.0 fL   MCH 29.5 26.0 - 34.0 pg   MCHC 34.1 30.0 - 36.0 g/dL   RDW 12.9 11.5 - 15.5 %   Platelets 186 150 - 400 K/uL    Comment: Performed at [facility], [place] [address] [place], [place] [address]  I-Stat CG4 Lactic Acid, ED      Status: Abnormal   Collection Time: 04/11/17 10:21 AM  Result Value Ref Range   Lactic Acid, Venous 2.25 (HH) 0.5 - 1.9 mmol/L   Comment NOTIFIED PHYSICIAN   I-Stat CG4 Lactic Acid, ED     Status: None   Collection Time: 04/11/17 12:42 PM  Result Value Ref Range   Lactic Acid, Venous 1.06 0.5 - 1.9 mmol/L  Urinalysis, Routine w reflex microscopic     Status: Abnormal   Collection Time: 04/11/17  2:25 PM  Result Value Ref Range   Color, Urine YELLOW YELLOW   APPearance HAZY (A) CLEAR   Specific Gravity, Urine 1.010 1.005 - 1.030   pH 7.5 5.0 - 8.0   Glucose, UA NEGATIVE NEGATIVE mg/dL   Hgb urine dipstick MODERATE (A) NEGATIVE   Bilirubin Urine NEGATIVE NEGATIVE   Ketones, ur NEGATIVE NEGATIVE mg/dL   Protein, ur NEGATIVE NEGATIVE mg/dL   Nitrite NEGATIVE NEGATIVE   Leukocytes, UA NEGATIVE NEGATIVE    Comment: Performed at [facility] [address], [place], [place] [address]  Urinalysis, Microscopic (reflex)     Status: Abnormal   Collection Time: 04/11/17  2:25 PM  Result Value Ref Range   RBC / HPF 0-5 0 - 5 RBC/hpf   WBC, UA 0-5 0 - 5 WBC/hpf   Bacteria, UA MANY (A) NONE SEEN   Squamous Epithelial / LPF 6-30 (A) NONE SEEN   Mucus PRESENT    Amorphous Crystal PRESENT     Comment: Performed at [facility], [place] [address], [place], [place] [address]   Ct Abdomen Pelvis W Contrast  Result Date: 04/11/2017 CLINICAL DATA:  Abdominal pain and distension following recent cholecystectomy EXAM: CT ABDOMEN AND PELVIS WITH CONTRAST TECHNIQUE: Multidetector CT imaging of the abdomen and pelvis was performed using the standard protocol following bolus administration of intravenous contrast. CONTRAST:  156m ISOVUE-300 IOPAMIDOL (ISOVUE-300) INJECTION 61% COMPARISON:  04/07/2017 FINDINGS: Lower chest: Mild bibasilar atelectatic changes are noted. Hepatobiliary: The liver is diffusely fatty infiltrated. The gallbladder has been surgically removed. No fluid collection is noted  in the gallbladder fossa. Pancreas: Unremarkable. No pancreatic ductal dilatation or surrounding inflammatory changes. Spleen: Normal in size without focal abnormality. Adrenals/Urinary Tract: Adrenal glands are unremarkable. Kidneys are normal, without renal calculi, focal lesion, or hydronephrosis. Bladder is unremarkable. Stomach/Bowel: Scattered diverticular changes noted. The appendix is within normal limits. Some very mild pericolonic inflammatory changes noted in the right mid abdomen which may represent some early  diverticulitis. No perforation or abscess formation is seen. Vascular/Lymphatic: No significant vascular findings are present. No enlarged abdominal or pelvic lymph nodes. Reproductive: Status post hysterectomy. No adnexal masses. Other: No abdominal wall hernia or abnormality. No abdominopelvic ascites. Musculoskeletal: Degenerative changes of lumbar spine are noted. IMPRESSION: Status post cholecystectomy. No findings to suggest significant bile duct leak or involving abscess are seen. Mild pericolonic inflammatory changes in an area of diverticulosis. This may represent some early diverticulitis. This could also be related to the recent surgery. No other focal abnormality is noted. Electronically Signed   By: Inez Catalina M.D.   On: 04/11/2017 14:07    Review of Systems  Constitutional: Negative for chills and fever.  HENT: Negative for hearing loss.   Eyes: Negative for blurred vision and double vision.  Respiratory: Negative for cough and hemoptysis.   Cardiovascular: Negative for chest pain and palpitations.  Gastrointestinal: Positive for abdominal pain, constipation, nausea and vomiting.  Genitourinary: Negative for dysuria and urgency.  Musculoskeletal: Negative for myalgias and neck pain.  Skin: Negative for itching and rash.  Neurological: Negative for dizziness, tingling and headaches.  Endo/Heme/Allergies: Does not bruise/bleed easily.  Psychiatric/Behavioral: Negative  for depression and suicidal ideas.    Blood pressure 112/76, pulse 100, temperature 98.3 F (36.8 C), temperature source Oral, resp. rate 16, SpO2 94 %. Physical Exam  Vitals reviewed. Constitutional: She is oriented to person, place, and time. She appears well-developed and well-nourished.  HENT:  Head: Normocephalic and atraumatic.  Eyes: Conjunctivae and EOM are normal. Pupils are equal, round, and reactive to light.  Neck: Normal range of motion. Neck supple.  Cardiovascular: Normal rate and regular rhythm.  Respiratory: Effort normal and breath sounds normal.  GI: Soft. Bowel sounds are normal. She exhibits no distension. There is tenderness in the right upper quadrant and epigastric area.  Incisions well healed  Musculoskeletal: Normal range of motion.  Neurological: She is alert and oriented to person, place, and time.  Skin: Skin is warm and dry.  Psychiatric: She has a normal mood and affect. Her behavior is normal.     Assessment/Plan 42 yo female s/p lap chole with increased LFTs including bilirubin. Her pain is largely underlying the extraction incision site. CT scan nonspecific -admit -pain control -NPO -MRCP to further evaluate biliary anatomy given transaminitis -recheck labs in am  Mickeal Skinner, MD 04/11/2017, 4:32 PM

## 2017-04-11 NOTE — ED Notes (Signed)
Pt to be taken to [facility] from MRI.

## 2017-04-11 NOTE — ED Notes (Signed)
Pt taken to MRI  

## 2017-04-11 NOTE — ED Provider Notes (Signed)
[name] Provider Note   CSN: [id number] Arrival date & time: 04/11/17  9528     History   Chief Complaint Chief Complaint  Patient presents with  . Abdominal Pain  . Post-op Problem    HPI Danielle [name] is a 42 y.o. female.  The history is provided by the patient. No language interpreter was used.  Abdominal Pain   This is a new problem. The current episode started 12 to 24 hours ago. The problem occurs constantly. The problem has been gradually worsening. The pain is associated with an unknown factor. The pain is located in the RUQ and generalized abdominal region. The quality of the pain is aching. The pain is moderate. Associated symptoms include nausea. Nothing aggravates the symptoms. Nothing relieves the symptoms. Past workup includes surgery. Her past medical history is significant for gallstones.  Pt reports she had surgery on 1/28.   Pt reports she began having severe pain today.  Pt reports vomiting.   Pt denies fever.  No cough  Past Medical History:  Diagnosis Date  . History of blood transfusion 2007   "related to OR"    Patient Active Problem List   Diagnosis Date Noted  . Cholecystitis 04/07/2017  . Gastritis and gastroduodenitis 02/20/2016  . Back pain 09/21/2012  . UTI 09/27/2009  . BACK PAIN 09/27/2009  . URINALYSIS, ABNORMAL 09/27/2009  . VERTIGO 02/24/2009  . SORE THROAT 10/07/2008  . EXTERNAL HEMORRHOIDS 07/11/2008  . RECTAL FISSURE 07/11/2008  . DEHYDRATION 06/29/2008  . PYELONEPHRITIS 06/29/2008  . HEADACHE 06/29/2008    Past Surgical History:  Procedure Laterality Date  . ABDOMINAL HYSTERECTOMY  2007  . CESAREAN SECTION  2007  . CHOLECYSTECTOMY N/A 04/07/2017   Procedure: LAPAROSCOPIC CHOLECYSTECTOMY;  Surgeon: Kinsinger, Arta Bruce, MD;  Location: [facility];  Service: General;  Laterality: N/A;  . LAPAROSCOPIC CHOLECYSTECTOMY  04/07/2017    OB History    Gravida Para Term Preterm AB  Living   4 3 3   1 4    SAB TAB Ectopic Multiple Live Births   1               Home Medications    Prior to Admission medications   Medication Sig Start Date End Date Taking? Authorizing Provider  oxyCODONE (OXY IR/ROXICODONE) 5 MG immediate release tablet Take 1 tablet (5 mg total) by mouth every 6 (six) hours as needed for severe pain. 04/08/17  Yes Focht, Jessica L, PA  naproxen (NAPROSYN) 500 MG tablet Take 1 tablet (500 mg total) by mouth 2 (two) times daily with a meal. Patient not taking: Reported on 04/11/2017 12/19/16   Argentina Donovan, PA-C  nitrofurantoin, macrocrystal-monohydrate, (MACROBID) 100 MG capsule Take 1 capsule (100 mg total) by mouth 2 (two) times daily. 04/03/17   Argentina Donovan, PA-C  omeprazole (PRILOSEC) 20 MG capsule Take 1 capsule (20 mg total) by mouth daily. Patient not taking: Reported on 04/07/2017 12/19/16   Argentina Donovan, PA-C  ondansetron [facility]) 8 MG tablet 1/2-1 tablet up to 3 times daily prn nausea Patient not taking: Reported on 04/03/2017 12/19/16   Argentina Donovan, PA-C  tiZANidine (ZANAFLEX) 4 MG tablet Take 1 tablet (4 mg total) by mouth every 8 (eight) hours as needed for muscle spasms. 10/01/16   Charlott Rakes, MD  traMADol (ULTRAM) 50 MG tablet Take 1 tablet (50 mg total) by mouth every 12 (twelve) hours as needed. Patient not taking: Reported on 04/03/2017 08/21/16  Charlott Rakes, MD    Family History Family History  Problem Relation Age of Onset  . Thyroid disease Mother     Social History Social History   Tobacco Use  . Smoking status: Never Smoker  . Smokeless tobacco: Never Used  Substance Use Topics  . Alcohol use: No  . Drug use: No     Allergies   Ampicillin and Penicillins   Review of Systems Review of Systems  Gastrointestinal: Positive for abdominal pain and nausea.  All other systems reviewed and are negative.    Physical Exam Updated Vital Signs BP 109/77   Pulse 80   Temp 98.3 F (36.8 C)  (Oral)   Resp 16   SpO2 95%   Physical Exam  Constitutional: She is oriented to person, place, and time. She appears well-developed and well-nourished.  HENT:  Head: Normocephalic.  Eyes: EOM are normal.  Neck: Normal range of motion.  Cardiovascular: Normal rate and regular rhythm.  Pulmonary/Chest: Effort normal.  Abdominal: Normal appearance and bowel sounds are normal. She exhibits no distension.  incision sites no erythema,  Tender right upper and mid abdomen.  Pain to palpation    Musculoskeletal: Normal range of motion.  Neurological: She is alert and oriented to person, place, and time.  Skin: Skin is warm.  Psychiatric: She has a normal mood and affect.  Nursing note and vitals reviewed.    ED Treatments / Results  Labs (all labs ordered are listed, but only abnormal results are displayed) Labs Reviewed  COMPREHENSIVE METABOLIC PANEL - Abnormal; Notable for the following components:      Result Value   Glucose, Bld 146 (*)    AST 343 (*)    ALT 265 (*)    Alkaline Phosphatase 180 (*)    Total Bilirubin 1.9 (*)    All other components within normal limits  I-STAT CG4 LACTIC ACID, ED - Abnormal; Notable for the following components:   Lactic Acid, Venous 2.25 (*)    All other components within normal limits  LIPASE, BLOOD  CBC  URINALYSIS, ROUTINE W REFLEX MICROSCOPIC  I-STAT CG4 LACTIC ACID, ED    EKG  EKG Interpretation None       Radiology Ct Abdomen Pelvis W Contrast  Result Date: 04/11/2017 CLINICAL DATA:  Abdominal pain and distension following recent cholecystectomy EXAM: CT ABDOMEN AND PELVIS WITH CONTRAST TECHNIQUE: Multidetector CT imaging of the abdomen and pelvis was performed using the standard protocol following bolus administration of intravenous contrast. CONTRAST:  125mL ISOVUE-300 IOPAMIDOL (ISOVUE-300) INJECTION 61% COMPARISON:  04/07/2017 FINDINGS: Lower chest: Mild bibasilar atelectatic changes are noted. Hepatobiliary: The liver is  diffusely fatty infiltrated. The gallbladder has been surgically removed. No fluid collection is noted in the gallbladder fossa. Pancreas: Unremarkable. No pancreatic ductal dilatation or surrounding inflammatory changes. Spleen: Normal in size without focal abnormality. Adrenals/Urinary Tract: Adrenal glands are unremarkable. Kidneys are normal, without renal calculi, focal lesion, or hydronephrosis. Bladder is unremarkable. Stomach/Bowel: Scattered diverticular changes noted. The appendix is within normal limits. Some very mild pericolonic inflammatory changes noted in the right mid abdomen which may represent some early diverticulitis. No perforation or abscess formation is seen. Vascular/Lymphatic: No significant vascular findings are present. No enlarged abdominal or pelvic lymph nodes. Reproductive: Status post hysterectomy. No adnexal masses. Other: No abdominal wall hernia or abnormality. No abdominopelvic ascites. Musculoskeletal: Degenerative changes of lumbar spine are noted. IMPRESSION: Status post cholecystectomy. No findings to suggest significant bile duct leak or involving abscess are seen. Mild  pericolonic inflammatory changes in an area of diverticulosis. This may represent some early diverticulitis. This could also be related to the recent surgery. No other focal abnormality is noted. Electronically Signed   By: Inez Catalina M.D.   On: 04/11/2017 14:07    Procedures Procedures (including critical care time)  Medications Ordered in ED Medications  iopamidol (ISOVUE-300) 61 % injection (not administered)  HYDROmorphone (DILAUDID) injection 1 mg (1 mg Intravenous Given 04/11/17 1148)  ondansetron (ZOFRAN) injection 4 mg (4 mg Intravenous Given 04/11/17 1148)  iopamidol (ISOVUE-300) 61 % injection (100 mLs  Contrast Given 04/11/17 1315)     Initial Impression / Assessment and Plan / ED Course  I have reviewed the triage vital signs and the nursing notes.  Pertinent labs & imaging results  that were available during my care of the patient were reviewed by me and considered in my medical decision making (see chart for details).   MDM Ct scan obtain obtained  And reviewed.  Pt has increased lft's since surgery.  Pt reports she feels better after IV Dilaudid.   Pt reports improved pain.  I spoke to [address] surgery.  Dr. Kieth Brightly will see pt for admission.   Final Clinical Impressions(s) / ED Diagnoses   Final diagnoses:  Post-op pain  Right upper quadrant abdominal pain    ED Discharge Orders    None       Sidney Ace 04/11/17 1518    Orlie Dakin, MD 04/11/17 [phone]

## 2017-04-11 NOTE — ED Notes (Signed)
Pt taken to CT.

## 2017-04-12 LAB — CBC
HCT: 39.5 % (ref 36.0–46.0)
Hemoglobin: 13.1 g/dL (ref 12.0–15.0)
MCH: 29 pg (ref 26.0–34.0)
MCHC: 33.2 g/dL (ref 30.0–36.0)
MCV: 87.4 fL (ref 78.0–100.0)
PLATELETS: 183 10*3/uL (ref 150–400)
RBC: 4.52 MIL/uL (ref 3.87–5.11)
RDW: 13.4 % (ref 11.5–15.5)
WBC: 5 10*3/uL (ref 4.0–10.5)

## 2017-04-12 LAB — COMPREHENSIVE METABOLIC PANEL
ALK PHOS: 187 U/L — AB (ref 38–126)
ALT: 897 U/L — AB (ref 14–54)
AST: 702 U/L — ABNORMAL HIGH (ref 15–41)
Albumin: 3.2 g/dL — ABNORMAL LOW (ref 3.5–5.0)
Anion gap: 7 (ref 5–15)
BUN: 6 mg/dL (ref 6–20)
CALCIUM: 8.6 mg/dL — AB (ref 8.9–10.3)
CO2: 26 mmol/L (ref 22–32)
CREATININE: 0.6 mg/dL (ref 0.44–1.00)
Chloride: 106 mmol/L (ref 101–111)
GFR calc non Af Amer: >60 mL/min (ref >60)
Glucose, Bld: 114 mg/dL — ABNORMAL HIGH (ref 65–99)
Potassium: 3.9 mmol/L (ref 3.5–5.1)
SODIUM: 139 mmol/L (ref 135–145)
Total Bilirubin: 4.1 mg/dL — ABNORMAL HIGH (ref 0.3–1.2)
Total Protein: 6.3 g/dL — ABNORMAL LOW (ref 6.5–8.1)

## 2017-04-12 NOTE — Progress Notes (Signed)
Pt's IV went bad, Removed. Pt refused to get a new IV. Educated pt.

## 2017-04-12 NOTE — Progress Notes (Signed)
  Subjective: Stable and alert.  Family present in room.  Denies nausea.has some upper abdominal pain although not severe. Afebrile.  Heart rate 82.  MRCP has been done but not read yet Morning lab work pending  Objective: Vital signs in last 24 hours: Temp:  [98.2 F (36.8 C)-98.7 F (37.1 C)] 98.7 F (37.1 C) (02/02 0349) Pulse Rate:  [58-117] 82 (02/02 0349) Resp:  [16-18] 16 (02/01 1500) BP: (101-133)/(61-86) 114/76 (02/02 0349) SpO2:  [92 %-99 %] 99 % (02/02 0349)    Intake/Output from previous day: 02/01 0701 - 02/02 0700 In: 756.3 [I.V.:756.3] Out: 600 [Urine:600] Intake/Output this shift: Total I/O In: 756.3 [I.V.:756.3] Out: 600 [Urine:600]  General appearance: alert.  Minimal distress. Resp: clear to auscultation bilaterally GI: abdomen is soft.  Nondistended.  Trocar sites look good.  Some bowel sounds present.  Minimal, nonlocalized tenderness.  Lab Results:  Recent Labs    04/11/17 1000  WBC 8.0  HGB 14.7  HCT 43.1  PLT 186   BMET Recent Labs    04/11/17 1000  NA 139  K 3.6  CL 104  CO2 24  GLUCOSE 146*  BUN 12  CREATININE 0.74  CALCIUM 9.1   PT/INR No results for input(s): LABPROT, INR in the last 72 hours. ABG No results for input(s): PHART, HCO3 in the last 72 hours.  Invalid input(s): PCO2, PO2  Studies/Results: Ct Abdomen Pelvis W Contrast  Result Date: 04/11/2017 CLINICAL DATA:  Abdominal pain and distension following recent cholecystectomy EXAM: CT ABDOMEN AND PELVIS WITH CONTRAST TECHNIQUE: Multidetector CT imaging of the abdomen and pelvis was performed using the standard protocol following bolus administration of intravenous contrast. CONTRAST:  119mL ISOVUE-300 IOPAMIDOL (ISOVUE-300) INJECTION 61% COMPARISON:  04/07/2017 FINDINGS: Lower chest: Mild bibasilar atelectatic changes are noted. Hepatobiliary: The liver is diffusely fatty infiltrated. The gallbladder has been surgically removed. No fluid collection is noted in the  gallbladder fossa. Pancreas: Unremarkable. No pancreatic ductal dilatation or surrounding inflammatory changes. Spleen: Normal in size without focal abnormality. Adrenals/Urinary Tract: Adrenal glands are unremarkable. Kidneys are normal, without renal calculi, focal lesion, or hydronephrosis. Bladder is unremarkable. Stomach/Bowel: Scattered diverticular changes noted. The appendix is within normal limits. Some very mild pericolonic inflammatory changes noted in the right mid abdomen which may represent some early diverticulitis. No perforation or abscess formation is seen. Vascular/Lymphatic: No significant vascular findings are present. No enlarged abdominal or pelvic lymph nodes. Reproductive: Status post hysterectomy. No adnexal masses. Other: No abdominal wall hernia or abnormality. No abdominopelvic ascites. Musculoskeletal: Degenerative changes of lumbar spine are noted. IMPRESSION: Status post cholecystectomy. No findings to suggest significant bile duct leak or involving abscess are seen. Mild pericolonic inflammatory changes in an area of diverticulosis. This may represent some early diverticulitis. This could also be related to the recent surgery. No other focal abnormality is noted. Electronically Signed   By: Inez Catalina M.D.   On: 04/11/2017 14:07    Anti-infectives: Anti-infectives (From admission, onward)   None      Assessment/Plan:  Abdominal pain and elevated LFTs. Review MRCP with radiology this morning Check repeat lab work this morning  POD#5 - Laparoscopic cholecystectomy. Clinically no evidence of intra-abdominal bleeding, bile leak, or hollow viscus injury.      LOS: 1 day    Adin Hector 04/12/2017

## 2017-04-13 LAB — HEPATIC FUNCTION PANEL
ALBUMIN: 3.4 g/dL — AB (ref 3.5–5.0)
ALK PHOS: 198 U/L — AB (ref 38–126)
ALT: 692 U/L — AB (ref 14–54)
AST: 271 U/L — AB (ref 15–41)
Bilirubin, Direct: 0.5 mg/dL (ref 0.1–0.5)
Indirect Bilirubin: 1.1 mg/dL — ABNORMAL HIGH (ref 0.3–0.9)
TOTAL PROTEIN: 6.6 g/dL (ref 6.5–8.1)
Total Bilirubin: 1.6 mg/dL — ABNORMAL HIGH (ref 0.3–1.2)

## 2017-04-13 NOTE — Progress Notes (Signed)
Offered pt to get a new IV, pt refused. Educated.

## 2017-04-13 NOTE — Discharge Summary (Signed)
Patient ID: Lashala Laser [id number] 42 y.o. [birth year]  Admit date: 04/11/2017  Discharge date and time: 04/13/2017  Admitting Physician: Gurney Maxin  Discharge Physician: Adin Hector  Admission Diagnoses: Jaundice [R17] Post-op pain [G89.18] Right upper quadrant abdominal pain [R10.11]  Discharge Diagnoses: jaundice                                         Postoperative pain  Operations:  none  Admission Condition: fair  Discharged Condition: good  Indication for Admission: 42 yo female POD 4 from lap chole. She states this were going ok but today she has much worse epigastric pain. Pain is constant and occasionally shoots down her right side. She has nausea but no vomiting. She has not had a bowel movement since surgery. She denies fevers or chills.    Hospital Course: initial evaluation showed some tenderness in the right upper quadrant mostly under the epigastric extraction port site.  She was not distended.  Bowel sounds were active.lab work showed bilirubin 1.9.  Transaminases were elevated.  Alk phosphatase 180.  WBC 8000.  Hemoglobin 14.7.    CT scan showed postcholecystectomy changes but no evidence to suggest bile leak or abscess or bleeding.  There is no evidence of [place] bowel perforation.     She was admitted for observation and further evaluation.  MRCP was performed but was basically normal other than postop changes.  Specifically MRCP showed no evidence of common duct stones.  CBD measures 6 mm.  No intrahepatic ductal dilatation.     Her pain improved and she resumed diet.  LFTs improved somewhat but were not normal.  On discharge bilirubin 1.6.  AST and ALTs to 71 and 682 respectively.  Clinically she was doing much better.She was ready for discharge on Sunday, February 3.  we had a long discussion utilizing a professional Patent attorney with her and her husband.  She was told that she seems to be getting better and hopefully this will be a  self-limited process.  She was told that possibly she had passed some common duct stones but that there did not seem to be any residual stones requiring intervention.  She was instructed in diet and activities.  No prescriptions were felt to be necessary by either patient or position.  She was asked to make an appointment with Dr. Kieth Brightly in 2 weeks.  Consults: None  Significant Diagnostic Studies: Blood work.  CT scan.  MRCP.  Treatments: IV hydration.  Diagnostic imaging.  Disposition: Home  Patient Instructions:  Allergies as of 04/13/2017      Reactions   Ampicillin Rash   Has patient had a PCN reaction causing immediate rash, facial/tongue/throat swelling, SOB or lightheadedness with hypotension: Yes Has patient had a PCN reaction causing severe rash involving mucus membranes or skin necrosis: Unk Has patient had a PCN reaction that required hospitalization: No Has patient had a PCN reaction occurring within the last 10 years: No If all of the above answers are "NO", then may proceed with Cephalosporin use.   Penicillins Rash   Has patient had a PCN reaction causing immediate rash, facial/tongue/throat swelling, SOB or lightheadedness with hypotension: Yes Has patient had a PCN reaction causing severe rash involving mucus membranes or skin necrosis: Unk Has patient had a PCN reaction that required hospitalization: No Has patient had a PCN reaction occurring within the last 10 years:  No If all of the above answers are "NO", then may proceed with Cephalosporin use.      Medication List    TAKE these medications   naproxen 500 MG tablet Commonly known as:  NAPROSYN Take 1 tablet (500 mg total) by mouth 2 (two) times daily with a meal.   nitrofurantoin (macrocrystal-monohydrate) 100 MG capsule Commonly known as:  MACROBID Take 1 capsule (100 mg total) by mouth 2 (two) times daily.   omeprazole 20 MG capsule Commonly known as:  PRILOSEC Take 1 capsule (20 mg total) by mouth  daily.   ondansetron 8 MG tablet Commonly known as:  ZOFRAN 1/2-1 tablet up to 3 times daily prn nausea   oxyCODONE 5 MG immediate release tablet Commonly known as:  Oxy IR/ROXICODONE Take 1 tablet (5 mg total) by mouth every 6 (six) hours as needed for severe pain.   tiZANidine 4 MG tablet Commonly known as:  ZANAFLEX Take 1 tablet (4 mg total) by mouth every 8 (eight) hours as needed for muscle spasms.   traMADol 50 MG tablet Commonly known as:  ULTRAM Take 1 tablet (50 mg total) by mouth every 12 (twelve) hours as needed.       Activity: no heavy lifting for 2 weeks Diet: low fat, low cholesterol diet Wound Care: none needed  Follow-up:  With Dr. Kieth Brightly in 2 weeks.  Signed: Edsel Petrin. Dalbert Batman, M.D., FACS General and minimally invasive surgery Breast and Colorectal Surgery  04/13/2017, 8:21 AM

## 2017-04-13 NOTE — Discharge Instructions (Signed)
[facility] ______[facility] °LAPAROSCOPIC SURGERY: POST OP INSTRUCTIONS °Always review your discharge instruction sheet given to you by the facility where your surgery was performed. °IF YOU HAVE DISABILITY OR FAMILY LEAVE FORMS, YOU MUST BRING THEM TO THE OFFICE FOR PROCESSING.   °DO NOT GIVE THEM TO YOUR DOCTOR. ° °1. A prescription for pain medication may be given to you upon discharge.  Take your pain medication as prescribed, if needed.  If narcotic pain medicine is not needed, then you may take acetaminophen (Tylenol) or ibuprofen (Advil) as needed. °2. Take your usually prescribed medications unless otherwise directed. °3. If you need a refill on your pain medication, please contact your pharmacy.  They will contact our office to request authorization. Prescriptions will not be filled after 5pm or on week-ends. °4. You should follow a light diet the first few days after arrival home, such as soup and crackers, etc.  Be sure to include lots of fluids daily. °5. Most patients will experience some swelling and bruising in the area of the incisions.  Ice packs will help.  Swelling and bruising can take several days to resolve.  °6. It is common to experience some constipation if taking pain medication after surgery.  Increasing fluid intake and taking a stool softener (such as Colace) will usually help or prevent this problem from occurring.  A mild laxative (Milk of Magnesia or Miralax) should be taken according to package instructions if there are no bowel movements after 48 hours. °7. Unless discharge instructions indicate otherwise, you may remove your bandages 24-48 hours after surgery, and you may shower at that time.  You may have steri-strips (small skin tapes) in place directly over the incision.  These strips should be left on the skin for 7-10 days.  If your surgeon used skin glue on the incision, you may shower in 24 hours.  The glue will flake off over the next 2-3 weeks.  Any sutures or  staples will be removed at the office during your follow-up visit. °8. ACTIVITIES:  You may resume regular (light) daily activities beginning the next day--such as daily self-care, walking, climbing stairs--gradually increasing activities as tolerated.  You may have sexual intercourse when it is comfortable.  Refrain from any heavy lifting or straining until approved by your doctor. °a. You may drive when you are no longer taking prescription pain medication, you can comfortably wear a seatbelt, and you can safely maneuver your car and apply brakes. °b. RETURN TO WORK:  __________________________________________________________ °9. You should see your doctor in the office for a follow-up appointment approximately 2-3 weeks after your surgery.  Make sure that you call for this appointment within a day or two after you arrive home to insure a convenient appointment time. °10. OTHER INSTRUCTIONS: __________________________________________________________________________________________________________________________ __________________________________________________________________________________________________________________________ °WHEN TO CALL YOUR DOCTOR: °1. Fever over 101.0 °2. Inability to urinate °3. Continued bleeding from incision. °4. Increased pain, redness, or drainage from the incision. °5. Increasing abdominal pain ° °The clinic staff is available to answer your questions during regular business hours.  Please don’t hesitate to call and ask to speak to one of the nurses for clinical concerns.  If you have a medical emergency, go to the nearest emergency room or call 911.  A surgeon from [facility] is always on call at the hospital. °[address], [address], [place], [place]  [address] ? [address] [address], [place],    [address] °[phone] ? [phone] ? FAX [phone] °Web site:   [email or web address] °

## 2017-04-22 ENCOUNTER — Ambulatory Visit: Payer: Self-pay | Attending: Family Medicine

## 2017-04-30 ENCOUNTER — Ambulatory Visit: Payer: Self-pay | Admitting: Family Medicine

## 2017-05-05 ENCOUNTER — Ambulatory Visit: Payer: Self-pay | Admitting: Family Medicine

## 2017-05-06 ENCOUNTER — Encounter: Payer: Self-pay | Admitting: Family Medicine

## 2017-05-06 ENCOUNTER — Ambulatory Visit: Payer: Self-pay | Attending: Family Medicine | Admitting: Family Medicine

## 2017-05-06 VITALS — BP 113/76 | HR 64 | Temp 97.4°F | Ht 64.0 in | Wt 162.0 lb

## 2017-05-06 DIAGNOSIS — Z0000 Encounter for general adult medical examination without abnormal findings: Secondary | ICD-10-CM

## 2017-05-06 DIAGNOSIS — Z1231 Encounter for screening mammogram for malignant neoplasm of breast: Secondary | ICD-10-CM

## 2017-05-06 DIAGNOSIS — Z124 Encounter for screening for malignant neoplasm of cervix: Secondary | ICD-10-CM

## 2017-05-06 DIAGNOSIS — Z01419 Encounter for gynecological examination (general) (routine) without abnormal findings: Secondary | ICD-10-CM | POA: Insufficient documentation

## 2017-05-06 DIAGNOSIS — Z1239 Encounter for other screening for malignant neoplasm of breast: Secondary | ICD-10-CM

## 2017-05-06 NOTE — Progress Notes (Addendum)
Subjective:  Patient ID: Danielle [name], female    DOB: 1975-12-25  Age: 42 y.o. MRN: [id number]  CC: Gynecologic Exam   HPI Danielle [name] presents for a complete physical exam.  Last month she underwent a laparoscopic cholecystectomy and has had a follow-up postop visit with her surgeon.  Reports doing well and denies abdominal pain. She is here for a Pap smear today but informs me she has had a hysterectomy. She has no other concerns today.  Past Medical History:  Diagnosis Date  . History of blood transfusion 2007   "related to OR"    Past Surgical History:  Procedure Laterality Date  . ABDOMINAL HYSTERECTOMY  2007  . CESAREAN SECTION  2007  . CHOLECYSTECTOMY N/A 04/07/2017   Procedure: LAPAROSCOPIC CHOLECYSTECTOMY;  Surgeon: Kinsinger, Arta Bruce, MD;  Location: [facility];  Service: General;  Laterality: N/A;  . LAPAROSCOPIC CHOLECYSTECTOMY  04/07/2017    Allergies  Allergen Reactions  . Ampicillin Rash    Has patient had a PCN reaction causing immediate rash, facial/tongue/throat swelling, SOB or lightheadedness with hypotension: Yes Has patient had a PCN reaction causing severe rash involving mucus membranes or skin necrosis: Unk Has patient had a PCN reaction that required hospitalization: No Has patient had a PCN reaction occurring within the last 10 years: No If all of the above answers are "NO", then may proceed with Cephalosporin use.   Marland Kitchen Penicillins Rash    Has patient had a PCN reaction causing immediate rash, facial/tongue/throat swelling, SOB or lightheadedness with hypotension: Yes Has patient had a PCN reaction causing severe rash involving mucus membranes or skin necrosis: Unk Has patient had a PCN reaction that required hospitalization: No Has patient had a PCN reaction occurring within the last 10 years: No If all of the above answers are "NO", then may proceed with Cephalosporin use.       Outpatient Medications Prior to Visit    Medication Sig Dispense Refill  . naproxen (NAPROSYN) 500 MG tablet Take 1 tablet (500 mg total) by mouth 2 (two) times daily with a meal. 60 tablet 0  . tiZANidine (ZANAFLEX) 4 MG tablet Take 1 tablet (4 mg total) by mouth every 8 (eight) hours as needed for muscle spasms. 90 tablet 2  . nitrofurantoin, macrocrystal-monohydrate, (MACROBID) 100 MG capsule Take 1 capsule (100 mg total) by mouth 2 (two) times daily. (Patient not taking: Reported on 05/06/2017) 6 capsule 0  . omeprazole (PRILOSEC) 20 MG capsule Take 1 capsule (20 mg total) by mouth daily. (Patient not taking: Reported on 04/07/2017) 30 capsule 3  . ondansetron (ZOFRAN) 8 MG tablet 1/2-1 tablet up to 3 times daily prn nausea (Patient not taking: Reported on 04/03/2017) 20 tablet 0  . oxyCODONE (OXY IR/ROXICODONE) 5 MG immediate release tablet Take 1 tablet (5 mg total) by mouth every 6 (six) hours as needed for severe pain. (Patient not taking: Reported on 05/06/2017) 15 tablet 0  . traMADol (ULTRAM) 50 MG tablet Take 1 tablet (50 mg total) by mouth every 12 (twelve) hours as needed. (Patient not taking: Reported on 04/03/2017) 50 tablet 1   No facility-administered medications prior to visit.     ROS Review of Systems  Constitutional: Negative for activity change, appetite change and fatigue.  HENT: Negative for congestion, sinus pressure and sore throat.   Eyes: Negative for visual disturbance.  Respiratory: Negative for cough, chest tightness, shortness of breath and wheezing.   Cardiovascular: Negative for chest pain and palpitations.  Gastrointestinal: Negative for abdominal distention, abdominal pain and constipation.  Endocrine: Negative for polydipsia.  Genitourinary: Negative for dysuria and frequency.  Musculoskeletal: Negative for arthralgias and back pain.  Skin: Negative for rash.  Neurological: Negative for tremors, light-headedness and numbness.  Hematological: Does not bruise/bleed easily.  Psychiatric/Behavioral:  Negative for agitation and behavioral problems.    Objective:  BP 113/76   Pulse 64   Temp (!) 97.4 F (36.3 C) (Oral)   Ht 5\' 4"  (1.626 m)   Wt 162 lb (73.5 kg)   SpO2 100%   BMI 27.81 kg/m   BP/Weight 05/06/2017 04/13/2017 06/18/7351  Systolic BP 299 242 683  Diastolic BP 76 65 73  Wt. (Lbs) 162 - -  BMI 27.81 - -      Physical Exam  Constitutional: She is oriented to person, place, and time. She appears well-developed and well-nourished. No distress.  HENT:  Head: Normocephalic.  Right Ear: External ear normal.  Left Ear: External ear normal.  Nose: Nose normal.  Mouth/Throat: Oropharynx is clear and moist.  Eyes: Conjunctivae and EOM are normal. Pupils are equal, round, and reactive to light.  Neck: Normal range of motion. No JVD present.  Cardiovascular: Normal rate, regular rhythm, normal heart sounds and intact distal pulses. Exam reveals no gallop.  No murmur heard. Pulmonary/Chest: Effort normal and breath sounds normal. No respiratory distress. She has no wheezes. She has no rales. She exhibits no tenderness.  Abdominal: Soft. Bowel sounds are normal. She exhibits no distension and no mass. There is no tenderness.  Genitourinary:  Genitourinary Comments: External genitalia, vagina, cervix, adnexa-normal  Musculoskeletal: Normal range of motion. She exhibits no edema or tenderness.  Neurological: She is alert and oriented to person, place, and time. She has normal reflexes.  Skin: Skin is warm and dry. She is not diaphoretic.  Psychiatric: She has a normal mood and affect.     Assessment & Plan:   1. Screening for cervical cancer Does seem to have an intact service as opposed to her history which suggests hysterectomy.  The patient is unsure of the pelvic procedure she had and will obtain records. - Cytology - PAP([place])  2. Annual physical exam Declines flu shot and Tdap Counseled on 150 minutes of exercise per week, healthy eating (including  decreased daily intake of saturated fats, cholesterol, added sugars, sodium), STI prevention, routine healthcare maintenance.   3. Screening for breast cancer - MM Digital Screening; Future   No orders of the defined types were placed in this encounter.   Follow-up: Return in about 12 months (around 05/06/2018) for complete physical exam.   Charlott Rakes MD

## 2017-05-06 NOTE — Patient Instructions (Signed)
[place] (Health Maintenance, Female) Un estilo de vida saludable y los cuidados preventivos pueden favorecer considerablemente a la salud y Musician. Pregunte a su mdico cul es el cronograma de exmenes peridicos apropiado para usted. Esta es una buena oportunidad para consultarlo sobre cmo prevenir enfermedades y [place] sano. Adems de los controles, hay muchas otras cosas que puede hacer usted mismo. Los expertos han realizado numerosas investigaciones [employer] cambios en el estilo de vida y las medidas de prevencin que, [place], lo ayudarn a mantenerse sano. Solicite a su mdico ms informacin. EL PESO Y LA DIETA Consuma una dieta saludable.  Asegrese de [employer] verduras, frutas, productos lcteos de bajo contenido de [place] y Advertising account planner.  No consuma muchos alimentos de alto contenido de grasas slidas, azcares agregados o sal.  Realice actividad fsica con regularidad. Esta es una de las prcticas ms importantes que puede hacer por su salud. ? La Delorise Shiner de los adultos deben hacer ejercicio durante al menos 124mnutos por semana. El ejercicio debe aumentar la frecuencia cardaca y pActorla transpiracin (ejercicio de i[place]. ? La mayora de los adultos tambin deben hacer ejercicios de elongacin al mToysRusveces a la semana. Agregue esto al su plan de ejercicio de intensidad moderada. Mantenga un peso saludable.  El ndice de masa corporal ([facility] es una medida que puede utilizarse para identificar posibles problemas de pEast Uniontown Proporciona una estimacin de la grasa corporal basndose en el peso y la altura. Su mdico puede ayudarle a dRadiation protection practitionerISouth Endy a lScientist, forensico mTheatre managerun peso saludable.  Para las mujeres de 20aos o ms: ? Un IJohn R. Oishei Children'S Hospitalmenor de 18,5 se considera bajo peso. ? Un ICumberland County Hospitalentre 18,5 y 24,9 es normal. ? Un IPelham Medical Centerentre 25 y 29,9 se considera sobrepeso. ? Un IMC de 30 o ms se considera  obesidad. Observe los niveles de colesterol y lpidos en la sangre.  Debe comenzar a rEnglish as a second language teacherde lpidos y cResearch officer, trade unionen la sangre a los 20aos y luego repetirlos cada 516aos  Es posible que nAutomotive engineerlos niveles de colesterol con mayor frecuencia si: ? Sus niveles de lpidos y colesterol son altos. ? Es mayor de 527CWC ? Presenta un alto riesgo de padecer enfermedades cardacas. DETECCIN DE CNCER Cncer de pulmn  Se recomienda realizar exmenes de deteccin de cncer de pulmn a personas adultas entre 574y 892aos que estn en riesgo de dHorticulturist, commercialde pulmn por sus antecedentes de consumo de tabaco.  Se recomienda una tomografa computarizada de baja dosis de los pulmones todos los aos a las personas que: ? Fuman actualmente. ? Hayan dejado el hbito en algn momento en los ltimos 15aos. ? Hayan fumado durante 30aos un paquete diario. Un paquete-ao equivale a fumar un promedio de un paquete de cigarrillos diario durante un ao.  Los exmenes de deteccin anuales deben continuar hasta que hayan pasado 15aos desde que dej de fumar.  Ya no debern realizarse si tiene un problema de salud que le impida recibir tratamiento para eScience writerde pulmn. Cncer de mama  Practique la autoconciencia de la mama. Esto significa reconocer la apariencia normal de sus mamas y cmo las siente.  Tambin significa realizar autoexmenes regulares de lJohnson & Johnson Informe a su mdico sobre cualquier cambio, sin importar cun pequeo sea.  Si tiene entre 20 y 363aos, un mdico debe realizarle un examen clnico de las mamas como parte del examen regular de s[place] cada 1 a  3aos.  Si tiene 40aos o ms, debe realizarse un examen clnico de las mamas todos los aos. Tambin considere realizarse una radiografa de las mamas (mamografa) todos los aos.  Si tiene antecedentes familiares de cncer de mama, hable con su mdico para someterse a un estudio gentico.  Si  tiene alto riesgo de padecer cncer de mama, hable con su mdico para someterse a una resonancia magntica y una mamografa todos los aos.  La evaluacin del gen del cncer de mama (BRCA) se recomienda a mujeres que tengan familiares con cnceres relacionados con el BRCA. Los cnceres relacionados con el BRCA incluyen los siguientes: ? Mama. ? Ovario. ? Trompas. ? Cnceres de peritoneo.  Los resultados de la evaluacin determinarn la necesidad de asesoramiento gentico y de anlisis de BRCA1 y BRCA2. Cncer de cuello del tero El mdico puede recomendarle que se haga pruebas peridicas de deteccin de cncer de los rganos de la pelvis (ovarios, tero y vagina). Estas pruebas incluyen un examen plvico, que abarca controlar si se produjeron cambios microscpicos en la superficie del cuello del tero (prueba de Papanicolaou). Pueden recomendarle que se haga estas pruebas cada 3aos, a partir de los 21aos.  A las mujeres que tienen entre 30 y 65aos, los mdicos pueden recomendarles que se sometan a exmenes plvicos y pruebas de Papanicolaou cada 3aos, o a la prueba de Papanicolaou y el examen plvico en combinacin con estudios de deteccin del virus del papiloma humano (VPH) cada 5aos. Algunos tipos de VPH aumentan el riesgo de padecer cncer de cuello del tero. La prueba para la deteccin del VPH tambin puede realizarse a mujeres de cualquier edad cuyos resultados de la prueba de Papanicolaou no sean claros.  Es posible que otros mdicos no recomienden exmenes de deteccin a mujeres no embarazadas que se consideran sujetos de bajo riesgo de padecer cncer de pelvis y que no tienen sntomas. Pregntele al mdico si un examen plvico de deteccin es adecuado para usted.  Si ha recibido un tratamiento para el cncer cervical o una enfermedad que podra causar cncer, necesitar realizarse una prueba de Papanicolaou y controles durante al menos 20 aos de concluido el tratamiento. Si no se  ha hecho el Papanicolaou con regularidad, debern volver a evaluarse los factores de riesgo (como tener un nuevo compaero sexual), para determinar si debe realizarse los estudios nuevamente. Algunas mujeres sufren problemas mdicos que aumentan la probabilidad de contraer cncer de cuello del tero. En estos casos, el mdico podr indicar que se realicen controles y pruebas de Papanicolaou con ms frecuencia. Cncer colorrectal  Este tipo de cncer puede detectarse y a menudo prevenirse.  Por lo general, los estudios de rutina se deben comenzar a hacer a partir de los 50 aos y hasta los 75 aos.  Sin embargo, el mdico podr aconsejarle que lo haga antes, si tiene factores de riesgo para el cncer de colon.  Tambin puede recomendarle que use un kit de prueba para hallar sangre oculta en la materia fecal.  Es posible que se use una pequea cmara en el extremo de un tubo para examinar directamente el colon (sigmoidoscopia o colonoscopia) a fin de detectar formas tempranas de cncer colorrectal.  Los exmenes de rutina generalmente comienzan a los 50aos.  El examen directo del colon se debe repetir cada 5 a 10aos hasta los 75aos. Sin embargo, es posible que se realicen exmenes con mayor frecuencia, si se detectan formas tempranas de plipos precancerosos o pequeos bultos. Cncer de piel  Revise la piel   de la cabeza a los pies con regularidad.  Informe a su mdico si aparecen nuevos lunares o los que tiene se modifican, especialmente en su forma y color.  Tambin notifique al mdico si tiene un lunar que es ms grande que el tamao de una goma de lpiz.  Siempre use pantalla solar. Aplique pantalla solar de manera libre y repetida a lo largo del da.  Protjase usando mangas y pantalones largos, un sombrero de ala ancha y gafas para el sol, siempre que se encuentre en el exterior. ENFERMEDADES CARDACAS, DIABETES E HIPERTENSIN ARTERIAL  La hipertensin arterial causa  enfermedades cardacas y aumenta el riesgo de ictus. La hipertensin arterial es ms probable en los siguientes casos: ? Las personas que tienen la presin arterial en el extremo del rango normal (100-139/85-89 mm Hg). ? Las personas con sobrepeso u obesidad. ? Las personas afroamericanas.  Si usted tiene entre 18 y 39 aos, debe medirse la presin arterial cada 3 a 5 aos. Si usted tiene 40 aos o ms, debe medirse la presin arterial todos los aos. Debe medirse la presin arterial dos veces: una vez cuando est en un hospital o una clnica y la otra vez cuando est en otro sitio. Registre el promedio de las dos mediciones. Para controlar su presin arterial cuando no est en un hospital o una clnica, puede usar lo siguiente: ? Una mquina automtica para medir la presin arterial en una farmacia. ? Un monitor para medir la presin arterial en el hogar.  Si tiene entre 55 y 79 aos, consulte a su mdico si debe tomar aspirina para prevenir el ictus.  Realcese exmenes de deteccin de la diabetes con regularidad. Esto incluye la toma de una muestra de sangre para controlar el nivel de azcar en la sangre durante el ayuno. ? Si tiene un peso normal y un bajo riesgo de padecer diabetes, realcese este anlisis cada tres aos despus de los 45aos. ? Si tiene sobrepeso y un alto riesgo de padecer diabetes, considere someterse a este anlisis antes o con mayor frecuencia. PREVENCIN DE INFECCIONES HepatitisB  Si tiene un riesgo ms alto de contraer hepatitis B, debe someterse a un examen de deteccin de este virus. Se considera que tiene un alto riesgo de contraer hepatitis B si: ? Naci en un pas donde la hepatitis B es frecuente. Pregntele a su mdico qu pases son considerados de alto riesgo. ? Sus padres nacieron en un pas de alto riesgo y usted no recibi una vacuna que lo proteja contra la hepatitis B (vacuna contra la hepatitis B). ? Tiene VIH o sida. ? Usa agujas para inyectarse  drogas. ? Vive con alguien que tiene hepatitis B. ? Ha tenido sexo con alguien que tiene hepatitis B. ? Recibe tratamiento de hemodilisis. ? Toma ciertos medicamentos para el cncer, trasplante de rganos y afecciones autoinmunitarias. Hepatitis C  Se recomienda un anlisis de sangre para: ? Todos los que nacieron entre 1945 y 1965. ? Todas las personas que tengan un riesgo de haber contrado hepatitis C. Enfermedades de transmisin sexual (ETS).  Debe realizarse pruebas de deteccin de enfermedades de transmisin sexual (ETS), incluidas gonorrea y clamidia si: ? Es sexualmente activo y es menor de 24aos. ? Es mayor de 24aos, y el mdico le informa que corre riesgo de tener este tipo de infecciones. ? La actividad sexual ha cambiado desde que le hicieron la ltima prueba de deteccin y tiene un riesgo mayor de tener clamidia o gonorrea. Pregntele al mdico si usted   tiene riesgo.  Si no tiene el VIH, pero corre riesgo de infectarse por el virus, se recomienda tomar diariamente un medicamento recetado para evitar la infeccin. Esto se conoce como profilaxis previa a la exposicin. Se considera que est en riesgo si: ? Es [place] sexualmente y no [place] preservativos habitualmente o no conoce el estado del VIH de sus Advertising copywriter. ? Se inyecta drogas. ? Es [place] sexualmente con Ardelia Mems pareja que tiene VIH. Consulte a su mdico para saber si tiene un alto riesgo de infectarse por el VIH. Si opta por comenzar la profilaxis previa a la exposicin, primero debe realizarse anlisis de deteccin del VIH. Luego, le harn anlisis cada 34mses mientras est tomando los medicamentos para la profilaxis previa a la exposicin. [facility] Si es premenopusica y puede quedar e[place] solicite a su mdico asesoramiento previo a la concepcin.  Si puede quedar embarazada, tome 400 a [id number]([id number]) de cido fAnheuser-Busch  Si desea evitar el embarazo, hable con su mdico sobre el  control de la natalidad (anticoncepcin). OSTEOPOROSIS Y MENOPAUSIA  La osteoporosis es una enfermedad en la que los huesos pierden los minerales y la fuerza por el avance de la edad. El resultado pueden ser fracturas graves en los hSaybrook El riesgo de osteoporosis puede identificarse con uArdelia Memsprueba de densidad sea.  Si tiene 65aos o ms, o si est en riesgo de sufrir osteoporosis y fracturas, pregunte a su mdico si debe someterse a exmenes.  Consulte a su mdico si debe tomar un suplemento de calcio o de vitamina D para reducir el riesgo de osteoporosis.  La menopausia puede presentar ciertos sntomas fsicos y rGaffer  La terapia de reemplazo hormonal puede reducir algunos de estos sntomas y rGaffer Consulte a su mdico para saber si la terapia de reemplazo hormonal es conveniente para usted. INSTRUCCIONES PARA EL CUIDADO EN EL HOGAR  Realcese los estudios de rutina de la salud, dentales y de lPublic librarian  MBath  No consuma ningn producto que contenga tabaco, lo que incluye cigarrillos, tabaco de mHigher education careers advisero cPsychologist, sport and exercise  Si est embarazada, no beba alcohol.  Si est amamantando, reduzca el consumo de alcohol y la frecuencia con la que consume.  Si es mujer y no est embarazada limite el consumo de alcohol a no ms de 1 medida por da. Una medida equivale a 12onzas de cerveza, 5onzas de vino o 1onzas de bebidas alcohlicas de alta graduacin.  No consuma drogas.  No comparta agujas.  Solicite ayuda a su mdico si necesita apoyo o informacin para abandonar las drogas.  Informe a su mdico si a menudo se siente deprimido.  Notifique a su mdico si alguna vez ha sido vctima de abuso o si no se siente seguro en su hogar. Esta informacin no tiene cMarine scientistel consejo del mdico. Asegrese de hacerle al mdico cualquier pregunta que tenga. Document Released: 02/14/2011 Document Revised: 03/18/2014 Document Reviewed:  11/29/2014 Elsevier Interactive Patient Education  2[employer]

## 2017-05-07 LAB — CYTOLOGY - PAP
Diagnosis: NEGATIVE
HPV (WINDOPATH): NOT DETECTED

## 2017-05-09 ENCOUNTER — Telehealth: Payer: Self-pay

## 2017-05-09 NOTE — Telephone Encounter (Signed)
Patient was called and informed of lab results. 

## 2017-05-28 ENCOUNTER — Ambulatory Visit: Payer: Self-pay | Attending: Family Medicine | Admitting: Physician Assistant

## 2017-05-28 VITALS — BP 101/70 | HR 70 | Temp 98.8°F | Resp 16 | Wt 158.8 lb

## 2017-05-28 DIAGNOSIS — Z880 Allergy status to penicillin: Secondary | ICD-10-CM | POA: Insufficient documentation

## 2017-05-28 DIAGNOSIS — M62838 Other muscle spasm: Secondary | ICD-10-CM | POA: Insufficient documentation

## 2017-05-28 DIAGNOSIS — R202 Paresthesia of skin: Secondary | ICD-10-CM | POA: Insufficient documentation

## 2017-05-28 MED ORDER — NAPROXEN 500 MG PO TABS: 500.0000 mg | ORAL_TABLET | Freq: Two times a day (BID) | ORAL | 1 refills | Status: DC

## 2017-05-28 MED ORDER — TIZANIDINE HCL 4 MG PO TABS: 4.0000 mg | ORAL_TABLET | Freq: Every day | ORAL | 3 refills | Status: DC

## 2017-05-28 NOTE — Progress Notes (Signed)
Patient ID: Danielle [name], female   DOB: 02-11-1976, 42 y.o.   MRN: [id number]      Danielle [name], is a 42 y.o. female  [id number]  [id number]  DOB - October 04, 1975  Subjective:  Chief Complaint and HPI: Danielle [name] is a 42 y.o. female here today 1 week h/o tingling and itching on the top of R foot and both hands, L side of face.  She is also having trouble with sleep and muscle spasms and wants a RF on zanaflex.  No rash.  No weakness.  Sometimes hands and feet feel painful.  Tingling and itching with decreased sensation.  Denies stress or anxiety.    [employer] "Christy Sartorius" is translating.    ROS:   Constitutional:  No f/c, No night sweats, No unexplained weight loss. EENT:  No vision changes, No blurry vision, No hearing changes. No mouth, throat, or ear problems.  Respiratory: No cough, No SOB Cardiac: No CP, no palpitations GI:  No abd pain, No N/V/D. GU: No Urinary s/sx Musculoskeletal: No joint pain Neuro: No headache, no dizziness, no motor weakness.  Skin: No rash Endocrine:  No polydipsia. No polyuria.  Psych: Denies SI/HI  No problems updated.  ALLERGIES: Allergies  Allergen Reactions  . Ampicillin Rash    Has patient had a PCN reaction causing immediate rash, facial/tongue/throat swelling, SOB or lightheadedness with hypotension: Yes Has patient had a PCN reaction causing severe rash involving mucus membranes or skin necrosis: Unk Has patient had a PCN reaction that required hospitalization: No Has patient had a PCN reaction occurring within the last 10 years: No If all of the above answers are "NO", then may proceed with Cephalosporin use.   Marland Kitchen Penicillins Rash    Has patient had a PCN reaction causing immediate rash, facial/tongue/throat swelling, SOB or lightheadedness with hypotension: Yes Has patient had a PCN reaction causing severe rash involving mucus membranes or skin necrosis: Unk Has patient had a PCN reaction that required  hospitalization: No Has patient had a PCN reaction occurring within the last 10 years: No If all of the above answers are "NO", then may proceed with Cephalosporin use.     PAST MEDICAL HISTORY: Past Medical History:  Diagnosis Date  . History of blood transfusion 2007   "related to OR"    MEDICATIONS AT HOME: Prior to Admission medications   Medication Sig Start Date End Date Taking? Authorizing Provider  naproxen (NAPROSYN) 500 MG tablet Take 1 tablet (500 mg total) by mouth 2 (two) times daily with a meal. 05/28/17   McClung, Dionne Bucy, PA-C  tiZANidine (ZANAFLEX) 4 MG tablet Take 1 tablet (4 mg total) by mouth at bedtime. 05/28/17   Argentina Donovan, PA-C     Objective:  EXAM:   Vitals:   05/28/17 1444  BP: 101/70  Pulse: 70  Resp: 16  Temp: 98.8 F (37.1 C)  TempSrc: Oral  SpO2: 96%  Weight: 158 lb 12.8 oz (72 kg)    General appearance : A&OX3. NAD. Non-toxic-appearing HEENT: Atraumatic and Normocephalic.  PERRLA. EOM intact.   Neck: supple, no JVD. No cervical lymphadenopathy. No thyromegaly Chest/Lungs:  Breathing-non-labored, Good air entry bilaterally, breath sounds normal without rales, rhonchi, or wheezing  CVS: S1 S2 regular, no murmurs, gallops, rubs  B Trapezius spasm in neck  Extremities: B hands with full S&ROM.  Sensory in tact.  N-V intact.  Normal grip.  Bilateral Lower Ext shows no edema, both legs are warm to touch with =  pulse throughout.  B foot exam is normal.  U/L et DTR= intact B Neurology:  CN II-XII grossly intact, Non focal.  Normal facial symmetry and sensory intact.   Psych:  TP linear. J/I WNL. Normal speech. Appropriate eye contact and affect.  Skin:  No Rash  Data Review Lab Results  Component Value Date   HGBA1C 5.6 05/18/2014   HGBA1C 5.4 10/12/2012     Assessment & Plan   1. Paresthesia No red flags - Vitamin D, 25-hydroxy - TSH - CBC with Differential/Platelet - naproxen (NAPROSYN) 500 MG tablet; Take 1 tablet (500 mg  total) by mouth 2 (two) times daily with a meal.  Dispense: 60 tablet; Refill: 1  2. Muscle spasm In trapezius B-this has helped in the past.   - tiZANidine (ZANAFLEX) 4 MG tablet; Take 1 tablet (4 mg total) by mouth at bedtime.  Dispense: 30 tablet; Refill: 3 - naproxen (NAPROSYN) 500 MG tablet; Take 1 tablet (500 mg total) by mouth 2 (two) times daily with a meal.  Dispense: 60 tablet; Refill: 1  Patient have been counseled extensively about nutrition and exercise  Return if symptoms worsen or fail to improve.  The patient was given clear instructions to go to ER or return to medical center if symptoms don't improve, worsen or new problems develop. The patient verbalized understanding. The patient was told to call to get lab results if they haven't heard anything in the next week.     Freeman Caldron, PA-C [facility] and [facility], [place]   05/28/2017, 3:04 PM

## 2017-05-29 ENCOUNTER — Telehealth: Payer: Self-pay

## 2017-05-29 LAB — CBC WITH DIFFERENTIAL/PLATELET
Basophils Absolute: 0 10*3/uL (ref 0.0–0.2)
Basos: 0 %
EOS (ABSOLUTE): 0.1 10*3/uL (ref 0.0–0.4)
EOS: 2 %
HEMATOCRIT: 40.9 % (ref 34.0–46.6)
Hemoglobin: 14 g/dL (ref 11.1–15.9)
IMMATURE GRANULOCYTES: 0 %
Immature Grans (Abs): 0 10*3/uL (ref 0.0–0.1)
Lymphocytes Absolute: 1.9 10*3/uL (ref 0.7–3.1)
Lymphs: 41 %
MCH: 29.6 pg (ref 26.6–33.0)
MCHC: 34.2 g/dL (ref 31.5–35.7)
MCV: 87 fL (ref 79–97)
MONOS ABS: 0.2 10*3/uL (ref 0.1–0.9)
Monocytes: 5 %
NEUTROS PCT: 52 %
Neutrophils Absolute: 2.4 10*3/uL (ref 1.4–7.0)
Platelets: 198 10*3/uL (ref 150–379)
RBC: 4.73 x10E6/uL (ref 3.77–5.28)
RDW: 14.3 % (ref 12.3–15.4)
WBC: 4.6 10*3/uL (ref 3.4–10.8)

## 2017-05-29 LAB — VITAMIN D 25 HYDROXY (VIT D DEFICIENCY, FRACTURES): Vit D, 25-Hydroxy: 30.2 ng/mL (ref 30.0–100.0)

## 2017-05-29 LAB — TSH: TSH: 0.911 u[IU]/mL (ref 0.450–4.500)

## 2017-05-29 NOTE — Telephone Encounter (Signed)
-----   Message from Argentina Donovan, [place] sent at 05/29/2017  9:06 AM EDT ----- Please call patient.  Her vitamin D is low-normal.  She should get over the counter vitamin D and take 2000 units daily.  This may help with some of her symptoms.  Her thyroid studies were normal.  Follow-up as planned. Thanks, Freeman Caldron, PA-C

## 2017-05-29 NOTE — Telephone Encounter (Signed)
CMA call patient regarding lab results   Patient was aware and understood in her language

## 2017-07-31 ENCOUNTER — Telehealth: Payer: Self-pay

## 2017-08-01 NOTE — Telephone Encounter (Signed)
JA 

## 2017-08-11 ENCOUNTER — Ambulatory Visit: Payer: Self-pay | Attending: Family Medicine

## 2017-09-10 ENCOUNTER — Ambulatory Visit: Payer: Self-pay

## 2017-10-09 ENCOUNTER — Ambulatory Visit: Payer: Self-pay | Attending: Family Medicine | Admitting: Physician Assistant

## 2017-10-09 VITALS — BP 116/76 | HR 60 | Temp 97.7°F | Resp 18 | Ht 64.0 in | Wt 164.0 lb

## 2017-10-09 DIAGNOSIS — Z789 Other specified health status: Secondary | ICD-10-CM

## 2017-10-09 DIAGNOSIS — M21612 Bunion of left foot: Secondary | ICD-10-CM

## 2017-10-09 DIAGNOSIS — Z880 Allergy status to penicillin: Secondary | ICD-10-CM | POA: Insufficient documentation

## 2017-10-09 DIAGNOSIS — R202 Paresthesia of skin: Secondary | ICD-10-CM

## 2017-10-09 DIAGNOSIS — M79645 Pain in left finger(s): Secondary | ICD-10-CM

## 2017-10-09 DIAGNOSIS — Z7951 Long term (current) use of inhaled steroids: Secondary | ICD-10-CM | POA: Insufficient documentation

## 2017-10-09 DIAGNOSIS — R200 Anesthesia of skin: Secondary | ICD-10-CM | POA: Insufficient documentation

## 2017-10-09 NOTE — Progress Notes (Signed)
Patient ID: Danielle [name], female   DOB: [birth year], 42 y.o.   MRN: [id number]     Danielle [name], is a 42 y.o. female  [id number]  [id number]  DOB - December 17, 1975  Subjective:  Chief Complaint and HPI: Danielle [name] is a 42 y.o. female here today for multiple issues.  Numbness in face, lips, B feet, and hands.  Occurs on and off.  Denies alleviating or exacerbating factors.  Occurring X 4-5 months.  Denies any stress.  +fatigue.  No change in appetite.  No N/V.  Also c/o pain in L great toe on and off that has worsened over the last few months.  C/o L thumb pain for a while.  NKI.    Refused interpreter.  daughter is with her and translating  ROS:   Constitutional:  No f/c, No night sweats, No unexplained weight loss. EENT:  No vision changes, No blurry vision, No hearing changes. No mouth, throat, or ear problems.  Respiratory: No cough, No SOB Cardiac: No CP, no palpitations GI:  No abd pain, No N/V/D. GU: No Urinary s/sx Musculoskeletal: see above Neuro: No headache, no dizziness, no motor weakness.  Skin: No rash Endocrine:  No polydipsia. No polyuria.  Psych: Denies SI/HI  No problems updated.  ALLERGIES: Allergies  Allergen Reactions  . Ampicillin Rash    Has patient had a PCN reaction causing immediate rash, facial/tongue/throat swelling, SOB or lightheadedness with hypotension: Yes Has patient had a PCN reaction causing severe rash involving mucus membranes or skin necrosis: Unk Has patient had a PCN reaction that required hospitalization: No Has patient had a PCN reaction occurring within the last 10 years: No If all of the above answers are "NO", then may proceed with Cephalosporin use.   Marland Kitchen Penicillins Rash    Has patient had a PCN reaction causing immediate rash, facial/tongue/throat swelling, SOB or lightheadedness with hypotension: Yes Has patient had a PCN reaction causing severe rash involving mucus membranes or skin necrosis: Unk Has  patient had a PCN reaction that required hospitalization: No Has patient had a PCN reaction occurring within the last 10 years: No If all of the above answers are "NO", then may proceed with Cephalosporin use.     PAST MEDICAL HISTORY: Past Medical History:  Diagnosis Date  . History of blood transfusion 2007   "related to OR"    MEDICATIONS AT HOME: Prior to Admission medications   Medication Sig Start Date End Date Taking? Authorizing Provider  naproxen (NAPROSYN) 500 MG tablet Take 1 tablet (500 mg total) by mouth 2 (two) times daily with a meal. 05/28/17  Yes Kanita Delage M, PA-C  tiZANidine (ZANAFLEX) 4 MG tablet Take 1 tablet (4 mg total) by mouth at bedtime. Patient not taking: Reported on 10/09/2017 05/28/17   Argentina Donovan, PA-C     Objective:  EXAM:   Vitals:   10/09/17 0941  BP: 116/76  Pulse: 60  Resp: 18  Temp: 97.7 F (36.5 C)  TempSrc: Oral  SpO2: 100%  Weight: 164 lb (74.4 kg)  Height: 5\' 4"  (1.626 m)    General appearance : A&OX3. NAD. Non-toxic-appearing HEENT: Atraumatic and Normocephalic.  PERRLA. EOM intact.  TM clear B. Mouth-MMM, post pharynx WNL w/o erythema, No PND. Neck: supple, no JVD. No cervical lymphadenopathy. No thyromegaly Chest/Lungs:  Breathing-non-labored, Good air entry bilaterally, breath sounds normal without rales, rhonchi, or wheezing  CVS: S1 S2 regular, no murmurs, gallops, rubs  L thumb/hands-full S&ROM.  Normal grip.  L foot-noticeable bunion deformity. Otherwise B feet WNL. Extremities: Bilateral Lower Ext shows no edema, both legs are warm to touch with = pulse throughout Neurology:  CN II-XII grossly intact, Non focal.   Psych:  TP linear. J/I WNL. Normal speech. Appropriate eye contact and affect.  Skin:  No Rash  Data Review Lab Results  Component Value Date   HGBA1C 5.6 05/18/2014   HGBA1C 5.4 10/12/2012     Assessment & Plan   1. Paresthesias - B12 and Folate Panel - CBC with  Differential/Platelet - Basic metabolic panel - TSH - Vitamin D, 25-hydroxy  2. Bunion of great toe of left foot Will refer once OC back in place.  Supportive shoes for now.    3. Pain of left thumb advil or tylenol and advised thumb spica splint.    4. Language barrier Daughter translated and additional time performing visit was required.      Patient have been counseled extensively about nutrition and exercise  Return in about 1 month (around 11/09/2017) for [place]-multiple issues.  The patient was given clear instructions to go to ER or return to medical center if symptoms don't improve, worsen or new problems develop. The patient verbalized understanding. The patient was told to call to get lab results if they haven't heard anything in the next week.     Freeman Caldron, PA-C [facility] and [facility], [place]   10/09/2017, 9:59 AM

## 2017-10-09 NOTE — Patient Instructions (Addendum)
Juanete (Bunion) Un juanete es una protuberancia en la base del dedo gordo del pie que se forma cuando los huesos de la articulacin del dedo se desplazan de su posicin. Lo juanetes pueden ser pequeos al principio, aunque generalmente se agrandan con el transcurso del tiempo. Pueden causar dolor al caminar. CAUSAS Entre las causas del juanete, se incluyen las siguientes:  Usar zapatos angostos o puntiagudos que obligan al dedo gordo a presionar sobre los otros dedos.  Un desarrollo anormal del pie causado por caer hacia adentro (pronacin).  Los cambios en el pie son causados por ciertas enfermedades, como la artritis reumatoide y la poliomielitis.  Una lesin en el pie. FACTORES DE RIESGO Los siguientes factores pueden hacer que usted sea propenso a sufrir esta afeccin:  Usar zapatos que comprimen los dedos del pie.  Tener ciertas enfermedades, tales como: ? Artritis reumatoide. ? Poliomielitis. ? Parlisis cerebral.  Tener antecedentes familiares de juanete.  Haber nacido con una deformidad en el pie, como por ejemplo, pie plano o arco vencido.  Hacer actividades que presionan mucho los pies, como el ballet. SNTOMAS El principal sntoma de un juanete es una protuberancia notable en el dedo gordo. Otros sntomas pueden ser los siguientes:  Dolor.  Hinchazn alrededor del dedo gordo.  Enrojecimiento e inflamacin.  Piel gruesa o endurecida en el dedo gordo o entre los dedos del pie.  Rigidez o prdida del movimiento del dedo gordo.  Dificultad para caminar. DIAGNSTICO Un juanete se puede diagnosticar en funcin de los sntomas, los antecedentes mdicos y las actividades que realiza. Pueden hacerle estudios, por ejemplo:  Radiografas. Estas permiten al mdico evaluar la posicin de los huesos en el pie y detectar si hay dao en la articulacin. Tambin permiten al mdico determinar la gravedad del juanete y la mejor manera de tratarlo.  Aspiracin de una  articulacin. En este estudio, se extrae una muestra del fluido de la articulacin del dedo del pie. Este estudio, que se puede hacer si tiene mucho dolor, ayuda a detectar enfermedades que causan la hinchazn dolorosa de las articulaciones, como la artritis. TRATAMIENTO Un juanete no tiene cura, pero el tratamiento puede ayudar a evitar que el juanete empeore. El tratamiento variar segn la gravedad de los sntomas. El mdico podr indicar lo siguiente:  Usar zapatos que tengan puntera ancha para los dedos.  Usar protectores para juanetes que sirven para amortiguar la zona afectada.  Vendar los dedos juntos para mantenerlos en una posicin normal.  Colocar un dispositivo adentro del zapato (ortesis) para reducir la presin en la articulacin del dedo.  Tomar medicamentos para aliviar el dolor, la inflamacin y la hinchazn.  Aplicar calor o hielo en la zona afectada.  Hacer ejercicios de elongacin.  Una ciruga para eliminar el tejido cicatrizal y reacomodar los dedos a su posicin normal. Este tratamiento es poco frecuente. INSTRUCCIONES PARA EL CUIDADO EN EL HOGAR  Para dar soporte a la articulacin del dedo, use calzado adecuado, plantillas o vendas como se lo haya indicado el mdico.  Tome los medicamentos de venta libre y los recetados solamente como se lo haya indicado el mdico.  Si se lo indican, aplique hielo sobre la zona lesionada: ? Ponga el hielo en una bolsa plstica. ? Coloque una toalla entre la piel y la bolsa de hielo. ? Coloque el hielo durante 20minutos, 2 a 3veces por da.  Si se lo indican, aplique calor en la zona afectada antes de realizar ejercicios. Use la fuente de calor que el mdico le   recomiende, como una compresa de calor hmedo o una almohadilla trmica. ? Coloque una toalla entre la piel y la fuente de calor. ? Aplique el calor durante 20 a 30minutos. ? Retire la fuente de calor si la piel se le pone de color rojo brillante. Esto es muy  importante si no puede sentir el dolor, el calor o el fro. Puede correr un riesgo mayor de sufrir quemaduras.  Haga ejercicios como se lo haya indicado el mdico.  Concurra a todas las visitas de control como se lo haya indicado el mdico. SOLICITE ATENCIN MDICA SI:  Los sntomas empeoran.  Los sntomas no mejoran en el trmino de 2 semanas. SOLICITE ATENCIN MDICA DE INMEDIATO SI:  Tiene mucho dolor o dificultad al caminar. Esta informacin no tiene como fin reemplazar el consejo del mdico. Asegrese de hacerle al mdico cualquier pregunta que tenga. Document Released: 02/25/2005 Document Revised: 05/20/2011 Document Reviewed: 09/25/2014 Elsevier Interactive Patient Education  2018 Elsevier Inc.  

## 2017-10-10 LAB — VITAMIN D 25 HYDROXY (VIT D DEFICIENCY, FRACTURES): VIT D 25 HYDROXY: 41.5 ng/mL (ref 30.0–100.0)

## 2017-10-10 LAB — CBC WITH DIFFERENTIAL/PLATELET
BASOS ABS: 0 10*3/uL (ref 0.0–0.2)
BASOS: 0 %
EOS (ABSOLUTE): 0.2 10*3/uL (ref 0.0–0.4)
Eos: 3 %
HEMOGLOBIN: 14.1 g/dL (ref 11.1–15.9)
Hematocrit: 43.1 % (ref 34.0–46.6)
IMMATURE GRANS (ABS): 0 10*3/uL (ref 0.0–0.1)
Immature Granulocytes: 0 %
LYMPHS: 36 %
Lymphocytes Absolute: 2 10*3/uL (ref 0.7–3.1)
MCH: 29.4 pg (ref 26.6–33.0)
MCHC: 32.7 g/dL (ref 31.5–35.7)
MCV: 90 fL (ref 79–97)
MONOCYTES: 6 %
Monocytes Absolute: 0.3 10*3/uL (ref 0.1–0.9)
NEUTROS ABS: 3 10*3/uL (ref 1.4–7.0)
Neutrophils: 55 %
Platelets: 171 10*3/uL (ref 150–450)
RBC: 4.79 x10E6/uL (ref 3.77–5.28)
RDW: 13.9 % (ref 12.3–15.4)
WBC: 5.5 10*3/uL (ref 3.4–10.8)

## 2017-10-10 LAB — BASIC METABOLIC PANEL
BUN / CREAT RATIO: 16 (ref 9–23)
BUN: 11 mg/dL (ref 6–24)
CALCIUM: 9.8 mg/dL (ref 8.7–10.2)
CHLORIDE: 102 mmol/L (ref 96–106)
CO2: 24 mmol/L (ref 20–29)
Creatinine, Ser: 0.69 mg/dL (ref 0.57–1.00)
GFR calc Af Amer: 124 mL/min/{1.73_m2} (ref >59)
GFR calc non Af Amer: 108 mL/min/{1.73_m2} (ref >59)
GLUCOSE: 88 mg/dL (ref 65–99)
Potassium: 3.7 mmol/L (ref 3.5–5.2)
Sodium: 144 mmol/L (ref 134–144)

## 2017-10-10 LAB — TSH: TSH: 0.99 u[IU]/mL (ref 0.450–4.500)

## 2017-10-10 LAB — B12 AND FOLATE PANEL: Vitamin B-12: 792 pg/mL (ref 232–1245)

## 2017-10-13 ENCOUNTER — Telehealth: Payer: Self-pay | Admitting: *Deleted

## 2017-10-13 ENCOUNTER — Ambulatory Visit: Payer: Self-pay

## 2017-10-13 NOTE — Telephone Encounter (Signed)
-----   Message from Argentina Donovan, [place] sent at 10/10/2017  9:33 AM EDT ----- Please call patient.  B-12, vitamin D, blood count, blood sugar, kidney function, and electrolytes are normal.  Follow-up as planned.  Thanks, Freeman Caldron, PA-C

## 2017-10-13 NOTE — Telephone Encounter (Signed)
Medical Assistant used [employer] to contact patient.  Interpreter Name: Sherrell Puller Interpreter #: [id number] Patient verified DOB Patient is aware of labs being normal. Patient had no further questions.

## 2017-10-22 ENCOUNTER — Ambulatory Visit: Payer: Self-pay | Admitting: Family Medicine

## 2017-10-30 ENCOUNTER — Other Ambulatory Visit: Payer: Self-pay | Admitting: Physician Assistant

## 2017-10-30 DIAGNOSIS — M62838 Other muscle spasm: Secondary | ICD-10-CM

## 2018-01-13 ENCOUNTER — Ambulatory Visit: Payer: Self-pay | Attending: Nurse Practitioner | Admitting: Nurse Practitioner

## 2018-01-13 ENCOUNTER — Encounter: Payer: Self-pay | Admitting: Nurse Practitioner

## 2018-01-13 VITALS — BP 121/79 | HR 64 | Temp 97.9°F | Ht 64.0 in | Wt 164.2 lb

## 2018-01-13 DIAGNOSIS — R202 Paresthesia of skin: Secondary | ICD-10-CM

## 2018-01-13 DIAGNOSIS — M25542 Pain in joints of left hand: Secondary | ICD-10-CM | POA: Insufficient documentation

## 2018-01-13 DIAGNOSIS — M62838 Other muscle spasm: Secondary | ICD-10-CM

## 2018-01-13 DIAGNOSIS — Z8349 Family history of other endocrine, nutritional and metabolic diseases: Secondary | ICD-10-CM | POA: Insufficient documentation

## 2018-01-13 DIAGNOSIS — R1310 Dysphagia, unspecified: Secondary | ICD-10-CM | POA: Insufficient documentation

## 2018-01-13 DIAGNOSIS — M25541 Pain in joints of right hand: Secondary | ICD-10-CM

## 2018-01-13 DIAGNOSIS — Z90710 Acquired absence of both cervix and uterus: Secondary | ICD-10-CM | POA: Insufficient documentation

## 2018-01-13 DIAGNOSIS — Z9049 Acquired absence of other specified parts of digestive tract: Secondary | ICD-10-CM | POA: Insufficient documentation

## 2018-01-13 DIAGNOSIS — Z880 Allergy status to penicillin: Secondary | ICD-10-CM | POA: Insufficient documentation

## 2018-01-13 MED ORDER — NAPROXEN 500 MG PO TABS: 500.0000 mg | ORAL_TABLET | Freq: Two times a day (BID) | ORAL | 2 refills | Status: DC

## 2018-01-13 MED ORDER — TIZANIDINE HCL 4 MG PO TABS: 4.0000 mg | ORAL_TABLET | Freq: Every day | ORAL | 2 refills | Status: DC

## 2018-01-13 NOTE — Addendum Note (Signed)
Addended by: Octaviano Glow on: 01/13/2018 02:43 PM   Modules accepted: Orders

## 2018-01-13 NOTE — Progress Notes (Signed)
Assessment & Plan:  Danielle [name] was seen today for hand pain and sore throat.  Diagnoses and all orders for this visit:  Paresthesia -     naproxen (NAPROSYN) 500 MG tablet; Take 1 tablet (500 mg total) by mouth 2 (two) times daily with a meal.  Muscle spasm -     naproxen (NAPROSYN) 500 MG tablet; Take 1 tablet (500 mg total) by mouth 2 (two) times daily with a meal. -     tiZANidine (ZANAFLEX) 4 MG tablet; Take 1 tablet (4 mg total) by mouth at bedtime.  Arthralgia of both hands -     Rheumatoid factor -     Cyclic Citrul Peptide Antibody, IGG -     Sedimentation Rate -     C-reactive protein    Patient has been counseled on age-appropriate routine health concerns for screening and prevention. These are reviewed and up-to-date. Referrals have been placed accordingly. Immunizations are up-to-date or declined.    Subjective:   Chief Complaint  Patient presents with  . Hand Pain    Pt. stated she is having weakness feeling on her right hand and bone pain.   . Sore Throat    Pt. stated her throat hurts and her ears also.    HPI Danielle [name] 43 y.o. female presents to office today to establish care. VRI was used to communicate directly with patient for the entire encounter including providing detailed patient instructions.    Paresthesias She endorses right hand, arm and bilateral feet numbness and tingling along with bilateral hand elbow, and knee pain.  Onset a few months ago. The pain is intermittent and numbness is worse at night when she is attempting to sleep. She denies any obvious joint swelling. She was seen a few months ago with the same symptoms and blood work including CBC, BMP, TSH, vitamin D and b12/folate were all negative. She denies any previous injuries. She endorses a history of arthritis in her mother but unsure if it was RA or OA. She has been taking naproxen and tizanidine with some relief of her joint pain. Will refill as requested and obtain labs to  evaluate for RA today.    Dysphagia She also endorses a sensation of "something in my throat when I swallow saliva". SHe denies any choking sensation with food or water or any GERD symptoms. Onset one week ago. Her tonsils do appear large on exam however there are no obvious signs of infection. I have recommended warm salt water gargles at this time.    Review of Systems  Constitutional: Negative for fever, malaise/fatigue and weight loss.  HENT: Negative.  Negative for nosebleeds.        SEE HPI  Eyes: Negative.  Negative for blurred vision, double vision and photophobia.  Respiratory: Negative.  Negative for cough and shortness of breath.   Cardiovascular: Negative.  Negative for chest pain, palpitations and leg swelling.  Gastrointestinal: Negative.  Negative for heartburn, nausea (denies) and vomiting (denies).  Musculoskeletal: Positive for joint pain. Negative for myalgias.  Neurological: Positive for tingling and sensory change. Negative for dizziness, focal weakness, seizures and headaches.  Psychiatric/Behavioral: Negative.  Negative for suicidal ideas.    Past Medical History:  Diagnosis Date  . History of blood transfusion 2007   "related to OR"    Past Surgical History:  Procedure Laterality Date  . ABDOMINAL HYSTERECTOMY  2007  . CESAREAN SECTION  2007  . CHOLECYSTECTOMY N/A 04/07/2017   Procedure: LAPAROSCOPIC CHOLECYSTECTOMY;  Surgeon: Kinsinger, Arta Bruce, MD;  Location: [facility];  Service: General;  Laterality: N/A;  . LAPAROSCOPIC CHOLECYSTECTOMY  04/07/2017    Family History  Problem Relation Age of Onset  . Thyroid disease Mother     Social History Reviewed with no changes to be made today.   Outpatient Medications Prior to Visit  Medication Sig Dispense Refill  . naproxen (NAPROSYN) 500 MG tablet Take 1 tablet (500 mg total) by mouth 2 (two) times daily with a meal. (Patient not taking: Reported on 01/13/2018) 60 tablet 1  . tiZANidine (ZANAFLEX) 4 MG  tablet TAKE 1 TABLET BY MOUTH AT BEDTIME. (Patient not taking: Reported on 01/13/2018) 30 tablet 1   No facility-administered medications prior to visit.     Allergies  Allergen Reactions  . Ampicillin Rash    Has patient had a PCN reaction causing immediate rash, facial/tongue/throat swelling, SOB or lightheadedness with hypotension: Yes Has patient had a PCN reaction causing severe rash involving mucus membranes or skin necrosis: Unk Has patient had a PCN reaction that required hospitalization: No Has patient had a PCN reaction occurring within the last 10 years: No If all of the above answers are "NO", then may proceed with Cephalosporin use.   Marland Kitchen Penicillins Rash    Has patient had a PCN reaction causing immediate rash, facial/tongue/throat swelling, SOB or lightheadedness with hypotension: Yes Has patient had a PCN reaction causing severe rash involving mucus membranes or skin necrosis: Unk Has patient had a PCN reaction that required hospitalization: No Has patient had a PCN reaction occurring within the last 10 years: No If all of the above answers are "NO", then may proceed with Cephalosporin use.        Objective:    BP 121/79 (BP Location: Left Arm, Patient Position: Sitting, Cuff Size: Normal)   Pulse 64   Temp 97.9 F (36.6 C) (Oral)   Ht 5\' 4"  (1.626 m)   Wt 164 lb 3.2 oz (74.5 kg)   SpO2 98%   BMI 28.18 kg/m  Wt Readings from Last 3 Encounters:  01/13/18 164 lb 3.2 oz (74.5 kg)  10/09/17 164 lb (74.4 kg)  05/28/17 158 lb 12.8 oz (72 kg)    Physical Exam  Constitutional: She is oriented to person, place, and time. She appears well-developed and well-nourished. She is cooperative.  HENT:  Head: Normocephalic and atraumatic.  Mouth/Throat: Uvula is midline, oropharynx is clear and moist and mucous membranes are normal. No oral lesions. No trismus in the jaw. No uvula swelling. No oropharyngeal exudate, posterior oropharyngeal edema, posterior oropharyngeal  erythema or tonsillar abscesses. Tonsils are 3+ on the right. Tonsils are 3+ on the left. No tonsillar exudate.  Eyes: EOM are normal.  Neck: Normal range of motion.  Cardiovascular: Normal rate, regular rhythm, normal heart sounds and intact distal pulses. Exam reveals no gallop and no friction rub.  No murmur heard. Pulmonary/Chest: Effort normal and breath sounds normal. No tachypnea. No respiratory distress. She has no decreased breath sounds. She has no wheezes. She has no rhonchi. She has no rales. She exhibits no tenderness.  Abdominal: Soft. Bowel sounds are normal.  Musculoskeletal: Normal range of motion. She exhibits no edema.       Right shoulder: She exhibits normal range of motion.       Right elbow: She exhibits normal range of motion and no swelling. No tenderness found.       Right wrist: She exhibits normal range of motion, no tenderness and  no swelling.       Left foot: There is deformity.       Feet:  Neurological: She is alert and oriented to person, place, and time. Coordination normal.  Skin: Skin is warm and dry.  Psychiatric: She has a normal mood and affect. Her behavior is normal. Judgment and thought content normal.  Nursing note and vitals reviewed.        Patient has been counseled extensively about nutrition and exercise as well as the importance of adherence with medications and regular follow-up. The patient was given clear instructions to go to ER or return to medical center if symptoms don't improve, worsen or new problems develop. The patient verbalized understanding.   Follow-up: No follow-ups on file.   Gildardo Pounds, FNP-BC [facility] and [place], [place]   01/13/2018, 9:31 AM

## 2018-01-13 NOTE — Patient Instructions (Signed)
Parestesia (Paresthesia) La parestesia es una sensacin de ardor o picazn que puede aparecer en cualquier parte del cuerpo. Suele [employer], los brazos, las piernas o los pies. Por lo general, no es dolorosa. En la [employer], la sensacin desaparece al poco tiempo y no es un signo de un problema grave. CUIDADOS EN EL HOGAR  Evite el consumo de alcohol.  Pruebe con masajes o acupuntura para Human resources officer sensacin de Tree surgeon.  Concurra a todas las visitas de control como se lo haya indicado el mdico. Esto es importante. SOLICITE AYUDA SI:  Contina teniendo episodios de parestesia.  La sensacin de ardor o picazn empeora al caminar.  Siente dolor o tiene calambres.  Siente mareos.  Tiene una erupcin cutnea. SOLICITE AYUDA DE INMEDIATO SI:  Se siente dbil.  Tiene dificultad para caminar o moverse.  Tiene problemas para hablar, comprender o ver.  Se siente confundido.  No puede controlar la orina (miccin) ni la evacuacin de la materia fecal (defecacin).  Pierde la sensibilidad (adormecimiento) despus de una lesin.  Pierde el conocimiento (se desmaya). Esta informacin no tiene Marine scientist el consejo del mdico. Asegrese de hacerle al mdico cualquier pregunta que tenga. Document Released: 03/30/2010 Document Revised: 07/12/2014 Document Reviewed: 02/21/2014 Elsevier Interactive Patient Education  [employer].

## 2018-01-14 LAB — C-REACTIVE PROTEIN: CRP: <1 mg/L (ref 0–10)

## 2018-01-14 LAB — SEDIMENTATION RATE: Sed Rate: 13 mm/hr (ref 0–32)

## 2018-01-14 LAB — RHEUMATOID FACTOR: Rhuematoid fact SerPl-aCnc: <10 IU/mL (ref 0.0–13.9)

## 2018-01-16 LAB — SPECIMEN STATUS REPORT

## 2018-01-16 LAB — CYCLIC CITRUL PEPTIDE ANTIBODY, IGG/IGA: CYCLIC CITRULLIN PEPTIDE AB: 7 U (ref 0–19)

## 2018-01-20 ENCOUNTER — Telehealth: Payer: Self-pay

## 2018-01-20 NOTE — Telephone Encounter (Signed)
-----   Message from Gildardo Pounds, NP sent at 01/19/2018  3:01 PM EST ----- Labs are negative for any autoimmune disease or inflammatory condition

## 2018-01-20 NOTE — Telephone Encounter (Signed)
CMA spoke to patient to inform on results.  Patient verified DOB. Patient understood.   [place] interpreter [phone] assist with the call.

## 2018-01-26 ENCOUNTER — Encounter

## 2018-01-26 ENCOUNTER — Ambulatory Visit: Payer: Self-pay

## 2018-02-11 ENCOUNTER — Ambulatory Visit: Payer: Self-pay | Attending: Family Medicine

## 2018-02-25 ENCOUNTER — Ambulatory Visit: Payer: Self-pay | Admitting: Family Medicine

## 2018-04-14 ENCOUNTER — Ambulatory Visit: Payer: Self-pay | Attending: Nurse Practitioner | Admitting: Nurse Practitioner

## 2018-04-14 ENCOUNTER — Encounter: Payer: Self-pay | Admitting: Nurse Practitioner

## 2018-04-14 VITALS — BP 115/80 | HR 69 | Temp 98.6°F | Ht 64.0 in | Wt 160.0 lb

## 2018-04-14 DIAGNOSIS — R202 Paresthesia of skin: Secondary | ICD-10-CM

## 2018-04-14 DIAGNOSIS — R232 Flushing: Secondary | ICD-10-CM

## 2018-04-14 DIAGNOSIS — M62838 Other muscle spasm: Secondary | ICD-10-CM

## 2018-04-14 MED ORDER — TIZANIDINE HCL 4 MG PO TABS: 4.0000 mg | ORAL_TABLET | Freq: Every day | ORAL | 2 refills | Status: DC

## 2018-04-14 MED ORDER — NAPROXEN 500 MG PO TABS: 500.0000 mg | ORAL_TABLET | Freq: Two times a day (BID) | ORAL | 2 refills | Status: DC

## 2018-04-14 MED ORDER — CITALOPRAM HYDROBROMIDE 20 MG PO TABS: 20.0000 mg | ORAL_TABLET | Freq: Every day | ORAL | 3 refills | Status: DC

## 2018-04-14 NOTE — Patient Instructions (Addendum)
Perimenopausia Perimenopause  Se llama perimenopausia a los perodos normales de la vida que tienen lugar antes y despus del cese definitivo de los perodos menstruales ([place]). La perimenopausia puede comenzar entre 2y 8aos antes de la menopausia y suele durar 1ao despus de la menopausia. Durante la perimenopausia, los ovarios pueden o no producir vulos. Cules son las causas? La perimenopausia es causada por un cambio natural en los niveles hormonales que se produce a medida que las mujeres envejecen. Qu incrementa el riesgo? Es ms probable que la perimenopausia comience a una edad ms temprana si usted tiene Armed forces training and education officer o ha recibido ciertos tratamientos, como los siguientes:  Tumor en la hipfisis en el cerebro.  Enfermedad que [employer] ovarios y la produccin de hormonas.  Tratamiento de radiacin para Science writer.  Ciertos tratamientos para el cncer, como la quimioterapia o la terapia hormonal (antiestrgenos).  Tabaquismo o consumo de alcohol excesivos.  Antecedentes familiares de menopausia temprana. Cules son los signos o sntomas? Los cambios por la perimenopausia afectan de [place] diferente a Information systems manager. Los sntomas de esta afeccin pueden incluir los siguientes:  Nurse, learning disability.  Sudoracin nocturna.  Perodos menstruales irregulares.  Disminucin del deseo sexual.  Sequedad vaginal.  Dolores de cabeza.  Cambios en el estado de nimo.  Depresin.  Problemas de memoria o dificultad para concentrarse.  Irritabilidad.  Cansancio.  Aumento de [place].  Ansiedad.  Problemas para quedar embarazada. Cmo se diagnostica? La perimenopausia se diagnostica en funcin de los antecedentes mdicos, un examen fsico, los antecedentes relativos a la menstruacin y los sntomas. Tambin es posible que le hagan estudios hormonales. Cmo se trata? En algunos casos, no se necesita tratamiento. Usted y su mdico deben tomar juntos la decisin  respecto a si necesita tratamiento o no. El tratamiento se basar en su situacin en particular y en sus preferencias. Existen varios tratamientos disponibles, como:  Terapia hormonal para la menopausia (THM).  Medicamentos para tratar sntomas especficos.  Acupuntura.  Suplementos vitamnicos o a base de hierbas. Antes de Biochemist, clinical, asegrese de informarle a su mdico si tiene antecedentes personales o familiares de lo siguiente:  Enfermedad cardaca.  Cncer de mama.  Cogulos de [place].  Diabetes.  Osteoporosis. Siga estas indicaciones en su casa: Estilo de vida  No consuma ningn producto que contenga nicotina o tabaco, como cigarrillos y Psychologist, sport and exercise. Si necesita ayuda para dejar de fumar, consulte al mdico.  Siga una dieta equilibrada que incluya frutas y verduras frescas, cereales integrales, soja, huevos, carnes magras y lcteos descremados.  Haga al menos 54minutos de actividad fsica, 5das por semana o ms.  Evite las bebidas alcohlicas y con cafena, as como los alimentos condimentados. Esto puede ayudar a Electronics engineer.  Duerma entre 7 y 5 horas todas las noches.  Vstase con capas de prendas que pueda quitarse para Automotive engineer.  Busque maneras de [employer], por ejemplo, realizar respiraciones profundas, Radio broadcast assistant o escribir un diario. Instrucciones generales  Lleve un registro de los [employer], incluido lo siguiente: ? Cundo se producen. ? Cun abundantes son y cunto duran. ? Cunto tiempo transcurre entre un perodo y Banker.  Lleve un registro de los sntomas: anote cundo comienzan, con qu frecuencia los tiene y cunto duran.  Tome los medicamentos de venta libre y los recetados solamente como se lo haya indicado el mdico.  Tome los suplementos vitamnicos solamente como se lo haya indicado el mdico. Estos pueden incluir calcio, vitamina E y vitamina D.  Use humectantes  o lubricantes vaginales para aliviar la sequedad vaginal y Teacher, English as a foreign language la comodidad durante el sexo.  Hable con el mdico antes de empezar a tomar cualquier suplemento a base de hierbas.  Concurra a todas las visitas de control como se lo haya indicado el mdico. Esto es importante. Esto incluye cualquier terapia de grupo o psicoterapia. Comunquese con un mdico si:  Tiene una hemorragia vaginal abundante o despide cogulos de sangre.  Su perodo menstrual dura ms de 2das que lo habitual.  Sus perodos menstruales se producen con Environmental health practitioner [address].  Sangra despus de [employer]. Solicite ayuda de inmediato si:  Tiene dolor en el pecho, dificultad para respirar o dificultad para hablar.  Est muy deprimida.  Siente dolor al [employer].  Siente dolor de cabeza intenso.  Tiene problemas de visin. Resumen  La perimenopausia es el perodo en el cual el cuerpo de la mujer comienza a pasar a la menopausia. Esto puede suceder naturalmente o como consecuencia de otros problemas de salud o tratamientos mdicos.  La perimenopausia puede comenzar entre 2y 8aos antes de la menopausia y suele durar 1ao despus de la menopausia.  Los sntomas de la perimenopausia pueden controlarse con medicamentos, cambios en el estilo de vida y tratamientos complementarios, como la acupuntura. Esta informacin no tiene Marine scientist el consejo del mdico. Asegrese de hacerle al mdico cualquier pregunta que tenga. Document Released: 02/25/2005 Document Revised: 09/02/2016 Document Reviewed: 09/02/2016 Elsevier Interactive Patient Education  2019 [place] Paresthesia La parestesia es una sensacin de ardor o picazn. Esta sensacin puede aparecer en cualquier parte del cuerpo. Suele [employer], los brazos, las piernas o los pies. Por lo general, no es dolorosa. En la [employer], la sensacin desaparece al poco tiempo y no es un signo de un  problema grave. Si tiene parestesia por [employer], posiblemente necesite una evaluacin mdica. Siga estas indicaciones en su casa: Consumo de alcohol   No beba alcohol si: ? El mdico le indica que no lo haga. ? Est embarazada, cree que puede estar embarazada o est tratando de quedar embarazada.  Si bebe alcohol, limite la cantidad que consume: ? De 0 a 1 medida por da para las mujeres. ? De 0 a 2 medidas por da para los hombres.  Est atento a la cantidad de alcohol que hay en las bebidas que toma. En los [place]. [place] una medida equivale a una botella tpica de cerveza (12 onzas), media copa de vino (5 onzas) o una medida de bebida blanca (1 onza). Nutricin  Siga una dieta saludable. Esto incluye lo siguiente: ? Consumir alimentos con alto contenido de [place], como frutas y verduras frescas, cereales integrales y frijoles. ? Limitar el consumo de alimentos que contienen gran cantidad de grasas y azcares procesados, como los alimentos fritos o dulces. Instrucciones generales  [employer] de venta libre y los recetados solamente como se lo haya indicado el mdico.  No consuma ningn producto que contenga nicotina o tabaco, como cigarrillos y Psychologist, sport and exercise. Si necesita ayuda para dejar de fumar, consulte al mdico.  Si tiene diabetes, colabore con el mdico para asegurarse de Advertising account executive de azcar en sangre dentro de un rango saludable.  Si siente adormecimiento en los pies: ? Controle si tiene enrojecimiento, calor e hinchazn todos los [place]. ? Use calcetines acolchados y zapatos cmodos. Estos ayudan a [employer].  Concurra a todas las visitas de seguimiento como  se lo haya indicado el mdico. Esto es importante. Comunquese con un mdico si:  Sus sntomas de parestesia empeoran o no desaparecen.  La sensacin de ardor o picazn empeora al caminar.  Siente dolor o tiene calambres.  Siente mareos.  Tiene una erupcin cutnea. Solicite  ayuda inmediatamente si:  Se siente dbil.  Tiene dificultad para caminar o moverse.  Tiene problemas para hablar, comprender o ver.  Se siente confundido.  No puede controlar la orina (miccin) ni las evacuaciones intestinales (deposiciones).  Pierde la sensibilidad (tiene adormecimiento) despus de una lesin.  Tiene mayor debilidad en un brazo o una pierna.  Pierde el conocimiento (se desmaya). Resumen  La parestesia es una sensacin de ardor o picazn. Suele [employer], los brazos, las piernas o los pies.  En la [employer], la sensacin desaparece al poco tiempo y no es un signo de un problema grave.  Si tiene parestesia por [employer], posiblemente necesite una evaluacin mdica. Esta informacin no tiene Marine scientist el consejo del mdico. Asegrese de hacerle al mdico cualquier pregunta que tenga. Document Released: 03/30/2010 Document Revised: 05/02/2017 Document Reviewed: 05/02/2017 Elsevier Interactive Patient Education  2019 [employer].

## 2018-04-14 NOTE — Progress Notes (Signed)
Assessment & Plan:  Danielle [name] was seen today for hot flashes.  Diagnoses and all orders for this visit:  Hot flashes -     citalopram (CELEXA) 20 MG tablet; Take 1 tablet (20 mg total) by mouth daily.  Muscle spasm -     tiZANidine (ZANAFLEX) 4 MG tablet; Take 1 tablet (4 mg total) by mouth at bedtime. -     naproxen (NAPROSYN) 500 MG tablet; Take 1 tablet (500 mg total) by mouth 2 (two) times daily with a meal.  Paresthesia -     naproxen (NAPROSYN) 500 MG tablet; Take 1 tablet (500 mg total) by mouth 2 (two) times daily with a meal.    Patient has been counseled on age-appropriate routine health concerns for screening and prevention. These are reviewed and up-to-date. Referrals have been placed accordingly. Immunizations are up-to-date or declined.    Subjective:   Chief Complaint  Patient presents with  . Hot Flashes    Pt. stated she is having a horrible hot flashes. Patient stated been going on for 4 months    HPI Danielle [name] 43 y.o. female presents to office today with complaints of hot flashes. This has been ongoing for several months and occurring every night and moderate to severe in severity. She denies any persistent cough or recent travel outside of the country.   She declines interpreter today.    Parathesia and Arthralgia She endorses right hand, arm and bilateral feet numbness and tingling along with bilateral hand, elbow, and knee joint pain.  Onset over a year ago.  The pain is intermittent and numbness is worse at night when she is attempting to sleep. Relieving factors: naproxen and tizanidine. Her labs for RA and autoimmune disorder negative. PHQ9 screening is normal. She denies any previous injury or trauma.    Review of Systems  Constitutional: Positive for diaphoresis. Negative for fever, malaise/fatigue and weight loss.       SEE HPI  HENT: Negative.  Negative for nosebleeds.   Eyes: Negative.  Negative for blurred vision, double vision  and photophobia.  Respiratory: Negative.  Negative for cough and shortness of breath.   Cardiovascular: Negative.  Negative for chest pain, palpitations and leg swelling.  Gastrointestinal: Negative.  Negative for heartburn, nausea and vomiting.  Musculoskeletal: Positive for joint pain and myalgias.  Neurological: Positive for sensory change. Negative for dizziness, focal weakness, seizures and headaches.  Psychiatric/Behavioral: Negative.  Negative for suicidal ideas.    Past Medical History:  Diagnosis Date  . History of blood transfusion 2007   "related to OR"    Past Surgical History:  Procedure Laterality Date  . ABDOMINAL HYSTERECTOMY  2007  . CESAREAN SECTION  2007  . CHOLECYSTECTOMY N/A 04/07/2017   Procedure: LAPAROSCOPIC CHOLECYSTECTOMY;  Surgeon: Kinsinger, Arta Bruce, MD;  Location: [facility];  Service: General;  Laterality: N/A;  . LAPAROSCOPIC CHOLECYSTECTOMY  04/07/2017    Family History  Problem Relation Age of Onset  . Thyroid disease Mother     Social History Reviewed with no changes to be made today.   Outpatient Medications Prior to Visit  Medication Sig Dispense Refill  . naproxen (NAPROSYN) 500 MG tablet Take 1 tablet (500 mg total) by mouth 2 (two) times daily with a meal. 60 tablet 2  . tiZANidine (ZANAFLEX) 4 MG tablet Take 1 tablet (4 mg total) by mouth at bedtime. 30 tablet 2   No facility-administered medications prior to visit.     Allergies  Allergen  Reactions  . Ampicillin Rash    Has patient had a PCN reaction causing immediate rash, facial/tongue/throat swelling, SOB or lightheadedness with hypotension: Yes Has patient had a PCN reaction causing severe rash involving mucus membranes or skin necrosis: Unk Has patient had a PCN reaction that required hospitalization: No Has patient had a PCN reaction occurring within the last 10 years: No If all of the above answers are "NO", then may proceed with Cephalosporin use.   Marland Kitchen Penicillins Rash     Has patient had a PCN reaction causing immediate rash, facial/tongue/throat swelling, SOB or lightheadedness with hypotension: Yes Has patient had a PCN reaction causing severe rash involving mucus membranes or skin necrosis: Unk Has patient had a PCN reaction that required hospitalization: No Has patient had a PCN reaction occurring within the last 10 years: No If all of the above answers are "NO", then may proceed with Cephalosporin use.        Objective:    BP 115/80 (BP Location: Left Arm, Patient Position: Sitting, Cuff Size: Normal)   Pulse 69   Temp 98.6 F (37 C) (Oral)   Ht 5\' 4"  (1.626 m)   Wt 160 lb (72.6 kg)   SpO2 99%   BMI 27.46 kg/m  Wt Readings from Last 3 Encounters:  04/14/18 160 lb (72.6 kg)  01/13/18 164 lb 3.2 oz (74.5 kg)  10/09/17 164 lb (74.4 kg)    Physical Exam Vitals signs and nursing note reviewed.  Constitutional:      Appearance: She is well-developed.  HENT:     Head: Normocephalic and atraumatic.  Neck:     Musculoskeletal: Normal range of motion.  Cardiovascular:     Rate and Rhythm: Normal rate and regular rhythm.     Heart sounds: Normal heart sounds. No murmur. No friction rub. No gallop.   Pulmonary:     Effort: Pulmonary effort is normal. No tachypnea or respiratory distress.     Breath sounds: Normal breath sounds. No decreased breath sounds, wheezing, rhonchi or rales.  Chest:     Chest wall: No tenderness.  Abdominal:     General: Bowel sounds are normal.     Palpations: Abdomen is soft.  Musculoskeletal: Normal range of motion.     Right elbow: Normal.    Left elbow: Normal.     Right knee: Normal.     Left knee: Normal.     Right hand: Normal.     Left hand: Normal.  Skin:    General: Skin is warm and dry.  Neurological:     Mental Status: She is alert and oriented to person, place, and time.     Coordination: Coordination normal.  Psychiatric:        Behavior: Behavior normal. Behavior is cooperative.         Thought Content: Thought content normal.        Judgment: Judgment normal.          Patient has been counseled extensively about nutrition and exercise as well as the importance of adherence with medications and regular follow-up. The patient was given clear instructions to go to ER or return to medical center if symptoms don't improve, worsen or new problems develop. The patient verbalized understanding.   Follow-up: Return in about 3 weeks (around 05/05/2018) for hot flashes and hand/feet parasthesia.   Gildardo Pounds, FNP-BC [facility] [place], [place]   04/15/2018, 5:21 PM

## 2018-04-15 ENCOUNTER — Encounter: Payer: Self-pay | Admitting: Nurse Practitioner

## 2018-04-22 ENCOUNTER — Other Ambulatory Visit (HOSPITAL_COMMUNITY): Payer: Self-pay | Admitting: *Deleted

## 2018-04-22 DIAGNOSIS — N644 Mastodynia: Secondary | ICD-10-CM

## 2018-05-01 ENCOUNTER — Telehealth: Payer: Self-pay | Admitting: Nurse Practitioner

## 2018-05-01 NOTE — Telephone Encounter (Signed)
Patient came in because she says she did not receive her medication. She is unsure which one she did not receive. Patient would like to know if she can still come to her appt scheduled for 03/09 even though she has not been taking the medication she was suppose to be on.

## 2018-05-01 NOTE — Telephone Encounter (Signed)
CMA spoke to patient.  She stated she picked up her naprosyn and muscle relaxer but she did not pick up her Celexa. Patient was told by the pharmacy that she only had two Rx and there were no other medication beside Naprosyn and Tizanidine.   CMA spoke to the pharmacy to fill her Celexa for the patient per patient she will come this afternoon to pick it up.

## 2018-05-01 NOTE — Telephone Encounter (Signed)
1) Medication(s) Requested (by name): Tizanidine Naproxen citalopram 2) Pharmacy of Choice:  [facility]

## 2018-05-01 NOTE — Telephone Encounter (Signed)
CMA spoke to patient to inform she should keep her next appt with her PCP.   Patient understood.  Spanish pac[place] interpreter assist with the call.

## 2018-05-18 ENCOUNTER — Encounter: Payer: Self-pay | Admitting: Nurse Practitioner

## 2018-05-18 ENCOUNTER — Ambulatory Visit: Payer: Self-pay | Attending: Nurse Practitioner | Admitting: Nurse Practitioner

## 2018-05-18 DIAGNOSIS — M62838 Other muscle spasm: Secondary | ICD-10-CM

## 2018-05-18 DIAGNOSIS — R232 Flushing: Secondary | ICD-10-CM

## 2018-05-18 DIAGNOSIS — R202 Paresthesia of skin: Secondary | ICD-10-CM

## 2018-05-18 MED ORDER — GABAPENTIN 100 MG PO CAPS: 100.0000 mg | ORAL_CAPSULE | Freq: Every day | ORAL | 3 refills | Status: DC

## 2018-05-18 MED ORDER — NAPROXEN 500 MG PO TABS: 500.0000 mg | ORAL_TABLET | Freq: Two times a day (BID) | ORAL | 2 refills | Status: DC

## 2018-05-18 MED ORDER — CITALOPRAM HYDROBROMIDE 20 MG PO TABS: 20.0000 mg | ORAL_TABLET | Freq: Every day | ORAL | 3 refills | Status: DC

## 2018-05-18 NOTE — Patient Instructions (Signed)
Electroneuromiografa Glass blower/designer) La electroneuromiografa es un estudio que se realiza para evaluar el funcionamiento de los msculos y de los nervios. Este procedimiento incluye el uso combinado de una electromiografa (EMG) y un estudio de conduccin nerviosa (ECN). La electromiografa se utiliza para detectar la presencia de trastornos musculares. El estudio de conduccin nerviosa, tambin Restaurant manager, fast food, determina si los nervios controlan bien los msculos. Generalmente, los procedimientos se realizan juntos para determinar si los msculos y los nervios estn sanos. Si la reaccin al estudio es anormal, puede significar que hay indicios de enfermedad o lesin, por ejemplo, dao a los nervios perifricos. INFORME A SU MDICO:  Cualquier alergia que tenga.  Todos los [employer], incluidos vitaminas, hierbas, gotas oftlmicas, cremas y medicamentos de venta libre.  Problemas previos que usted o los [employer] de su familia hayan tenido con el uso de anestsicos.  Enfermedades de la sangre que tenga.  Si tiene cirugas previas.  Cualquier enfermedad que tenga.  Si tiene Geographical information systems officer. RIESGOS Y COMPLICACIONES En general, se trata de un procedimiento seguro. Sin embargo, pueden presentarse problemas, por ejemplo:  Infecciones donde se insertaron los electrodos.  Hemorragia. ANTES DEL PROCEDIMIENTO  Consulte a su mdico acerca de estos temas: ? Cambiar o suspender los medicamentos que toma habitualmente. Esto es muy importante si toma medicamentos para la diabetes o anticoagulantes. ? Tomar medicamentos, como aspirina e ibuprofeno. Estos medicamentos pueden tener un efecto anticoagulante en la [place]. No tome estos medicamentos antes del procedimiento si el mdico le indica que no lo haga.  El mdico puede pedirle que evite lo siguiente: ? La cafena, como el caf y el t. ? La nicotina. Esto incluye cigarrillos y cualquier producto que contenga  tabaco.  No use lociones ni cremas el mismo Oceanographer. PROCEDIMIENTO Para la electromiografa:  El mdico le pedir que permanezca en una posicin para poder acceder al [employer] se estudiar. Usted puede estar de pie, sentado o acostado.  Tal vez le administren un medicamento para adormecer la zona (anestesia local).  Se insertar en el msculo una aguja muy delgada que tiene un electrodo.  Se colocar otro electrodo sobre la piel cerca del msculo.  El mdico le pedir que siga permaneciendo quieto.  Los electrodos enviarn una seal que informa sobre la actividad Art therapist de los msculos. Puede verla en un monitor o escucharla en la sala.  Despus de estudiar los msculos en reposo, el mdico le pedir que los contraiga o los flexione. Los electrodos enviarn una seal que informa sobre la actividad Art therapist de los msculos.  El mdico retirar los electrodos y las agujas con electrodos una vez finalizado el procedimiento. Este procedimiento puede variar segn el mdico y el hospital. Para el estudio de conduccin nerviosa:  Se colocar sobre la piel, junto al msculo que se estudia, un electrodo que registra la actividad de los nervios (electrodo de Control and instrumentation engineer).  Cerca del electrodo de Control and instrumentation engineer, se colocar un electrodo que se [place] como referencia (electrodo de Biomedical engineer).  Se aplicar una pasta o un gel sobre la piel entre el electrodo de Control and instrumentation engineer y el de Biomedical engineer.  Se estimular el nervio con un choque suave. El mdico medir cunto tiempo le lleva al [employer].  El mdico retirar los electrodos y el gel una vez finalizado el procedimiento. Este procedimiento puede variar segn el mdico y el hospital. DESPUS DEL PROCEDIMIENTO  Es su responsabilidad retirar el resultado del [place]. Pregntele al mdico o consulte en el departamento donde  se realice el estudio cundo y cmo obtendr los [place].  El mdico puede hacer lo  siguiente: ? Administrarle analgsicos. ? Controlar los sitios de insercin para asegurarse de que el sangrado se Production assistant, radio. Esta informacin no tiene Marine scientist el consejo del mdico. Asegrese de hacerle al mdico cualquier pregunta que tenga. Document Released: 12/16/2012 Document Revised: 07/12/2014 Document Reviewed: 04/18/2014 Elsevier Interactive Patient Education  2019 [employer].

## 2018-05-18 NOTE — Progress Notes (Signed)
Assessment & Plan:  Danielle [name] was seen today for follow-up.  Diagnoses and all orders for this visit:  Hot flashes -     citalopram (CELEXA) 20 MG tablet; Take 1 tablet (20 mg total) by mouth daily.  Paresthesia -     Magnesium -     Ambulatory referral to Neurology -     gabapentin (NEURONTIN) 100 MG capsule; Take 1 capsule (100 mg total) by mouth at bedtime.      Patient has been counseled on age-appropriate routine health concerns for screening and prevention. These are reviewed and up-to-date. Referrals have been placed accordingly. Immunizations are up-to-date or declined.    Subjective:   Chief Complaint  Patient presents with  . Follow-up    Pt. stated the pharmacy couldn't give her Celexa and naprosyn. Pt. only have Tizanidine.    HPI Danielle [name] 43 y.o. female presents to office today for follow-up to hot flashes and paresthesias.  Unfortunately she was unable to pick up any of her medications aside from tizanidine since her last office visit so therefore she has no relief of either symptom today. She declined VRI today and her adult daughter is interpreting for her.  Hot Flashes Chronic and ongoing for the past 6 months.  Occurring nightly and moderate to severe in severity.  Started her on Celexa at her last office visit 1 month ago however she still has not picked up the Celexa.  Will refill today 4 weeks for resolution of symptoms.  Paresthesias and multiple joint arthralgias She is taking the muscle relaxant at night which provides relief of her muscle spasms.  She endorses bilateral hand, shoulder and foot numbness with onset over 1 year ago.  There is also joint pain and numbness of the bilateral hands which is worse at night when she is attempting to sleep.  Relieving factors in the past: Naproxen and tizanidine.  Labs for  B12 and folate, rheumatoid arthritis and autoimmune disorder panels have been negative.  She denies any previous injury, trauma or repetitive  activity.  As it appears her paresthesia symptoms are more prominent than the arthralgias, will start her on low-dose gabapentin, continue tizanidine, and discontinue naproxen.  Review of Systems  Constitutional: Positive for diaphoresis. Negative for fever, malaise/fatigue and weight loss.       Hot flashes  HENT: Negative.  Negative for nosebleeds.   Eyes: Negative.  Negative for blurred vision, double vision and photophobia.  Respiratory: Negative.  Negative for cough and shortness of breath.   Cardiovascular: Negative.  Negative for chest pain, palpitations and leg swelling.  Gastrointestinal: Negative.  Negative for heartburn, nausea and vomiting.  Musculoskeletal: Positive for joint pain. Negative for myalgias.  Neurological: Positive for tingling and sensory change. Negative for dizziness, focal weakness, seizures and headaches.  Psychiatric/Behavioral: Negative.  Negative for suicidal ideas.    Past Medical History:  Diagnosis Date  . History of blood transfusion 2007   "related to OR"    Past Surgical History:  Procedure Laterality Date  . ABDOMINAL HYSTERECTOMY  2007  . CESAREAN SECTION  2007  . CHOLECYSTECTOMY N/A 04/07/2017   Procedure: LAPAROSCOPIC CHOLECYSTECTOMY;  Surgeon: Kinsinger, Arta Bruce, MD;  Location: [facility];  Service: General;  Laterality: N/A;  . LAPAROSCOPIC CHOLECYSTECTOMY  04/07/2017    Family History  Problem Relation Age of Onset  . Thyroid disease Mother     Social History Reviewed with no changes to be made today.   Outpatient Medications Prior to Visit  Medication Sig Dispense Refill  . tiZANidine (ZANAFLEX) 4 MG tablet Take 1 tablet (4 mg total) by mouth at bedtime. 30 tablet 2  . citalopram (CELEXA) 20 MG tablet Take 1 tablet (20 mg total) by mouth daily. (Patient not taking: Reported on 05/18/2018) 30 tablet 3  . naproxen (NAPROSYN) 500 MG tablet Take 1 tablet (500 mg total) by mouth 2 (two) times daily with a meal. (Patient not taking:  Reported on 05/18/2018) 60 tablet 2   No facility-administered medications prior to visit.     Allergies  Allergen Reactions  . Ampicillin Rash    Has patient had a PCN reaction causing immediate rash, facial/tongue/throat swelling, SOB or lightheadedness with hypotension: Yes Has patient had a PCN reaction causing severe rash involving mucus membranes or skin necrosis: Unk Has patient had a PCN reaction that required hospitalization: No Has patient had a PCN reaction occurring within the last 10 years: No If all of the above answers are "NO", then may proceed with Cephalosporin use.   Marland Kitchen Penicillins Rash    Has patient had a PCN reaction causing immediate rash, facial/tongue/throat swelling, SOB or lightheadedness with hypotension: Yes Has patient had a PCN reaction causing severe rash involving mucus membranes or skin necrosis: Unk Has patient had a PCN reaction that required hospitalization: No Has patient had a PCN reaction occurring within the last 10 years: No If all of the above answers are "NO", then may proceed with Cephalosporin use.        Objective:    BP 105/74 (BP Location: Right Arm, Patient Position: Sitting, Cuff Size: Normal)   Pulse 70   Temp 98.2 F (36.8 C) (Oral)   Ht 5\' 4"  (1.626 m)   Wt 151 lb 3.2 oz (68.6 kg)   SpO2 95%   BMI 25.95 kg/m  Wt Readings from Last 3 Encounters:  05/18/18 151 lb 3.2 oz (68.6 kg)  04/14/18 160 lb (72.6 kg)  01/13/18 164 lb 3.2 oz (74.5 kg)    Physical Exam Vitals signs and nursing note reviewed.  Constitutional:      Appearance: She is well-developed.  HENT:     Head: Normocephalic and atraumatic.  Neck:     Musculoskeletal: Normal range of motion.  Cardiovascular:     Rate and Rhythm: Normal rate and regular rhythm.     Heart sounds: Normal heart sounds. No murmur. No friction rub. No gallop.   Pulmonary:     Effort: Pulmonary effort is normal. No tachypnea or respiratory distress.     Breath sounds: Normal breath  sounds. No decreased breath sounds, wheezing, rhonchi or rales.  Chest:     Chest wall: No tenderness.  Abdominal:     General: Bowel sounds are normal.     Palpations: Abdomen is soft.  Musculoskeletal: Normal range of motion.     Right hand: She exhibits normal range of motion and no tenderness. She exhibits no thumb/finger opposition.     Left hand: She exhibits normal range of motion and no tenderness. She exhibits no thumb/finger opposition.  Skin:    General: Skin is warm and dry.  Neurological:     General: No focal deficit present.     Mental Status: She is alert and oriented to person, place, and time.     Cranial Nerves: Cranial nerves are intact.     Motor: Motor function is intact. No tremor.     Coordination: Coordination is intact. Coordination normal. Finger-Nose-Finger Test normal.  Gait: Gait is intact.  Psychiatric:        Behavior: Behavior normal. Behavior is cooperative.        Thought Content: Thought content normal.        Judgment: Judgment normal.          Patient has been counseled extensively about nutrition and exercise as well as the importance of adherence with medications and regular follow-up. The patient was given clear instructions to go to ER or return to medical center if symptoms don't improve, worsen or new problems develop. The patient verbalized understanding.   Follow-up: Return in about 4 weeks (around 06/15/2018) for f/u for celexa and gabapentin.   Gildardo Pounds, FNP-BC [facility] [place], [place]   05/18/2018, 3:39 PM

## 2018-05-19 LAB — MAGNESIUM: Magnesium: 2.2 mg/dL (ref 1.6–2.3)

## 2018-05-22 ENCOUNTER — Telehealth: Payer: Self-pay

## 2018-05-22 NOTE — Telephone Encounter (Signed)
CMA spoke to patient to inform on results.  Pt. Verified DOB. Pt. Understood.  Spanish interpreter Quillian Quince [phone] assist with the call.

## 2018-05-22 NOTE — Telephone Encounter (Signed)
-----   Message from Gildardo Pounds, NP sent at 05/22/2018  6:48 AM EDT ----- Magnesium is normal. There are not electrolyte imbalances contributing to your numbness. Will continue to monitor.

## 2018-06-22 ENCOUNTER — Telehealth (HOSPITAL_COMMUNITY): Payer: Self-pay | Admitting: *Deleted

## 2018-06-22 NOTE — Telephone Encounter (Signed)
Telephoned patient at home number and confirmed appointment April 14. No symptoms of COVID-19. No contact with someone with a confirmed diagnosis of COVID-19. No travel outside [place] is the past 14 days. Used interpreter Lavon Paganini.

## 2018-06-23 ENCOUNTER — Other Ambulatory Visit: Payer: Self-pay

## 2018-06-23 ENCOUNTER — Other Ambulatory Visit (HOSPITAL_COMMUNITY): Payer: Self-pay | Admitting: *Deleted

## 2018-06-23 ENCOUNTER — Ambulatory Visit (HOSPITAL_COMMUNITY)
Admission: RE | Admit: 2018-06-23 | Discharge: 2018-06-23 | Disposition: A | Discharge status: Home / Self Care | Payer: Self-pay | Source: Ambulatory Visit | Attending: Obstetrics and Gynecology | Admitting: Obstetrics and Gynecology

## 2018-06-23 ENCOUNTER — Encounter (HOSPITAL_COMMUNITY): Payer: Self-pay

## 2018-06-23 VITALS — BP 108/64 | Temp 97.6°F | Wt 156.0 lb

## 2018-06-23 DIAGNOSIS — Z1231 Encounter for screening mammogram for malignant neoplasm of breast: Secondary | ICD-10-CM

## 2018-06-23 DIAGNOSIS — Z1239 Encounter for other screening for malignant neoplasm of breast: Secondary | ICD-10-CM

## 2018-06-23 NOTE — Patient Instructions (Signed)
Explained breast self awareness with [name]. Patient did not need a Pap smear today due to last Pap smear and HPV typing was 05/05/2017.  Let her know BCCCP will cover Pap smears and HPV typing every 5 years unless has a history of abnormal Pap smears. Referred patient to the [facility] for a screening mammogram. [facility] will [facility] patient with screening mammogram appointment. Patient aware the [facility] will call her with appointment. [name] verbalized understanding.  Jaedin Regina, Arvil Chaco, RN 9:59 AM

## 2018-06-23 NOTE — Progress Notes (Signed)
No complaints today.  Pap Smear: Pap smear not completed today. Last Pap smear was 05/05/2017 at [facility] and normal with negative HPV. Per patient has no history of an abnormal Pap smear. 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 Last two Pap smear results are in Epic.  Physical exam: Breasts Breasts symmetrical. No skin abnormalities bilateral breasts. No nipple retraction bilateral breasts. No nipple discharge bilateral breasts. No lymphadenopathy. No lumps palpated bilateral breasts. No complaints of pain or tenderness on exam. Referred patient to the [facility] for a screening mammogram. [facility] will call patient with screening mammogram appointment.       Pelvic/Bimanual No Pap smear completed today since last Pap smear and HPV typing was 05/05/2017. Pap smear not indicated per BCCCP guidelines.   Smoking History: Patient has never smoked.  Patient Navigation: Patient education provided. Access to services provided for patient through [facility] program. Spanish interpreter provided.   Breast and Cervical Cancer Risk Assessment: Patient has no family history of breast cancer, known genetic mutations, or radiation treatment to the chest before age 40. Patient has no history of cervical dysplasia, immunocompromised, or DES exposure in-utero.  Risk Assessment    Risk Scores      06/23/2018   Last edited by: Armond Hang, LPN   5-year risk: 0.4 %   Lifetime risk: 6.3 %         Used Spanish interpreter Rudene Anda from [employer].

## 2018-06-25 ENCOUNTER — Other Ambulatory Visit: Payer: Self-pay

## 2018-07-02 ENCOUNTER — Other Ambulatory Visit: Payer: Self-pay | Admitting: Physician Assistant

## 2018-07-02 DIAGNOSIS — M62838 Other muscle spasm: Secondary | ICD-10-CM

## 2018-07-24 ENCOUNTER — Encounter (HOSPITAL_COMMUNITY): Payer: Self-pay | Admitting: *Deleted

## 2018-08-11 ENCOUNTER — Inpatient Hospital Stay: Admission: RE | Admit: 2018-08-11 | Payer: Self-pay | Source: Ambulatory Visit

## 2018-09-03 ENCOUNTER — Other Ambulatory Visit: Payer: Self-pay

## 2018-09-03 ENCOUNTER — Ambulatory Visit
Admission: RE | Admit: 2018-09-03 | Discharge: 2018-09-03 | Disposition: A | Discharge status: Home / Self Care | Payer: Self-pay | Source: Ambulatory Visit | Attending: Obstetrics and Gynecology | Admitting: Obstetrics and Gynecology

## 2018-09-03 DIAGNOSIS — Z1231 Encounter for screening mammogram for malignant neoplasm of breast: Secondary | ICD-10-CM

## 2018-10-07 ENCOUNTER — Ambulatory Visit: Payer: No Typology Code available for payment source | Attending: Physician Assistant | Admitting: Physician Assistant

## 2018-10-07 ENCOUNTER — Other Ambulatory Visit: Payer: Self-pay

## 2018-10-07 DIAGNOSIS — R0789 Other chest pain: Secondary | ICD-10-CM

## 2018-10-07 DIAGNOSIS — R232 Flushing: Secondary | ICD-10-CM

## 2018-10-07 MED ORDER — CITALOPRAM HYDROBROMIDE 20 MG PO TABS: 20.0000 mg | ORAL_TABLET | Freq: Every day | ORAL | 3 refills | Status: DC

## 2018-10-07 NOTE — Progress Notes (Signed)
Pt. Stated she feel dizzy and her body feels hot sometime.

## 2018-10-07 NOTE — Progress Notes (Signed)
Patient ID: Danielle [name], female   DOB: October 06, 1975, 43 y.o.   MRN: [id number] Virtual Visit via Telephone Note  I connected with Danielle [name] on 10/07/18 at  1:30 PM EDT by telephone and verified that I am speaking with the correct person using two identifiers.   I discussed the limitations, risks, security and privacy concerns of performing an evaluation and management service by telephone and the availability of in person appointments. I also discussed with the patient that there may be a patient responsible charge related to this service. The patient expressed understanding and agreed to proceed.  Patient location:  home My Location:  [place] office Persons on the call:  Me, the patient, and interpreter(Mark)   History of Present Illness:  Patient says she has been having hot flashes for about 1 year.  No periods since age 71. C/o some pressure in chest and SOB for about 2 weeks.  No N/V/D.  No wheezing or coughing.  The pressure in her chest comes and goes.   Appetite is good.  No radiating pain.  She hasn't been taking her Celexa for about 2 months.  No dizziness.  Not having pain currently.     Observations/Objective: A&Ox3  Assessment and Plan: 1. Hot flashes - citalopram (CELEXA) 20 MG tablet; Take 1 tablet (20 mg total) by mouth daily.  Dispense: 30 tablet; Refill: 3  2. Chest pressure Advised to go to ED or Urgent Care for assessment. Call 911 if pain/pressure returns or is severe.    Follow Up Instructions: See PCP in 1 month;  Sooner if needed   I discussed the assessment and treatment plan with the patient. The patient was provided an opportunity to ask questions and all were answered. The patient agreed with the plan and demonstrated an understanding of the instructions.   The patient was advised to call back or seek an in-person evaluation if the symptoms worsen or if the condition fails to improve as anticipated.  I provided 14 minutes of non-face-to-face time during  this encounter.   Freeman Caldron, PA-C

## 2018-10-22 ENCOUNTER — Ambulatory Visit: Payer: No Typology Code available for payment source

## 2018-10-22 ENCOUNTER — Ambulatory Visit: Payer: No Typology Code available for payment source | Admitting: Internal Medicine

## 2018-10-23 ENCOUNTER — Other Ambulatory Visit: Payer: Self-pay | Admitting: Nurse Practitioner

## 2018-10-23 DIAGNOSIS — M62838 Other muscle spasm: Secondary | ICD-10-CM

## 2018-11-09 ENCOUNTER — Ambulatory Visit: Payer: No Typology Code available for payment source | Admitting: Nurse Practitioner

## 2018-11-17 ENCOUNTER — Ambulatory Visit: Payer: No Typology Code available for payment source

## 2018-11-23 ENCOUNTER — Ambulatory Visit: Payer: No Typology Code available for payment source

## 2018-12-31 ENCOUNTER — Other Ambulatory Visit: Payer: Self-pay | Admitting: Family Medicine

## 2018-12-31 DIAGNOSIS — M62838 Other muscle spasm: Secondary | ICD-10-CM

## 2019-02-02 ENCOUNTER — Ambulatory Visit: Payer: Self-pay | Attending: Nurse Practitioner | Admitting: Physician Assistant

## 2019-02-02 ENCOUNTER — Other Ambulatory Visit: Payer: Self-pay

## 2019-02-02 VITALS — BP 135/74 | HR 65 | Temp 98.2°F | Resp 18 | Ht 65.0 in | Wt 153.0 lb

## 2019-02-02 DIAGNOSIS — M546 Pain in thoracic spine: Secondary | ICD-10-CM

## 2019-02-02 DIAGNOSIS — Z13220 Encounter for screening for lipoid disorders: Secondary | ICD-10-CM

## 2019-02-02 DIAGNOSIS — R238 Other skin changes: Secondary | ICD-10-CM

## 2019-02-02 DIAGNOSIS — R233 Spontaneous ecchymoses: Secondary | ICD-10-CM

## 2019-02-02 DIAGNOSIS — R109 Unspecified abdominal pain: Secondary | ICD-10-CM

## 2019-02-02 LAB — POCT URINALYSIS DIP (CLINITEK)
Bilirubin, UA: NEGATIVE
Glucose, UA: NEGATIVE mg/dL
Ketones, POC UA: NEGATIVE mg/dL
Nitrite, UA: NEGATIVE
POC PROTEIN,UA: NEGATIVE
Spec Grav, UA: 1.015 (ref 1.010–1.025)
Urobilinogen, UA: 0.2 E.U./dL
pH, UA: 7 (ref 5.0–8.0)

## 2019-02-02 MED ORDER — MELOXICAM 15 MG PO TABS: 15.0000 mg | ORAL_TABLET | Freq: Every day | ORAL | 1 refills | Status: DC

## 2019-02-02 MED ORDER — OMEPRAZOLE 20 MG PO CPDR: 20.0000 mg | DELAYED_RELEASE_CAPSULE | Freq: Every day | ORAL | 3 refills | Status: DC

## 2019-02-02 MED ORDER — METHOCARBAMOL 500 MG PO TABS: 500.0000 mg | ORAL_TABLET | Freq: Four times a day (QID) | ORAL | 0 refills | Status: DC

## 2019-02-02 NOTE — Patient Instructions (Signed)
Drink 80-100 ounces water daily 

## 2019-02-02 NOTE — Progress Notes (Signed)
Patient ID: Danielle [name], female   DOB: 1975-08-23, 43 y.o.   MRN: [id number]    Aleynah Blumberg, is a 43 y.o. female  [id number]  [id number]  DOB - 1975-04-02  Subjective:  Chief Complaint and HPI: Danielle [name] is a 43 y.o. female here today with multiple complaints. Freida Busman with [employer] interpreters translating.  LBP in across mid to low back for about 1 month. NKI. Has not tried any meds.   Pain is worse with movement.  No f/c.  No radiating pain.  No paresthesias or weakness.    Stomach pain for about 1 week.  No N/V.  No period in years.  Pain is in mid epigastric region.  Mild intensity.  Pain is constant.  Nor urinary s/sx.  No N/V/D.  No melena/hematocheizia.  Appetite is good.  She has had cholecystectomy.    ROS:   Constitutional:  No f/c, No night sweats, No unexplained weight loss. EENT:  No vision changes, No blurry vision, No hearing changes. No mouth, throat, or ear problems.  Respiratory: No cough, No SOB Cardiac: No CP, no palpitations GI:  + abd pain, No N/V/D. GU: No Urinary s/sx Musculoskeletal: No joint pain Neuro: No headache, no dizziness, no motor weakness.  Skin: No rash Endocrine:  No polydipsia. No polyuria.  Psych: Denies SI/HI  No problems updated.  ALLERGIES: Allergies  Allergen Reactions  . Ampicillin Rash    Has patient had a PCN reaction causing immediate rash, facial/tongue/throat swelling, SOB or lightheadedness with hypotension: Yes Has patient had a PCN reaction causing severe rash involving mucus membranes or skin necrosis: Unk Has patient had a PCN reaction that required hospitalization: No Has patient had a PCN reaction occurring within the last 10 years: No If all of the above answers are "NO", then may proceed with Cephalosporin use.   Marland Kitchen Penicillins Rash    Has patient had a PCN reaction causing immediate rash, facial/tongue/throat swelling, SOB or lightheadedness with hypotension: Yes Has patient had a PCN reaction causing  severe rash involving mucus membranes or skin necrosis: Unk Has patient had a PCN reaction that required hospitalization: No Has patient had a PCN reaction occurring within the last 10 years: No If all of the above answers are "NO", then may proceed with Cephalosporin use.     PAST MEDICAL HISTORY: Past Medical History:  Diagnosis Date  . History of blood transfusion 2007   "related to OR"    MEDICATIONS AT HOME: Prior to Admission medications   Medication Sig Start Date End Date Taking? Authorizing Provider  citalopram (CELEXA) 20 MG tablet Take 1 tablet (20 mg total) by mouth daily. 10/07/18  Yes Argentina Donovan, PA-C  gabapentin (NEURONTIN) 100 MG capsule Take 1 capsule (100 mg total) by mouth at bedtime. 05/18/18 02/02/19 Yes Gildardo Pounds, NP  meloxicam (MOBIC) 15 MG tablet Take 1 tablet (15 mg total) by mouth daily. X 1 week the prn pain 02/02/19   Freeman Caldron M, PA-C  methocarbamol (ROBAXIN) 500 MG tablet Take 1 tablet (500 mg total) by mouth 4 (four) times daily. X 7 days then prn muscle spasm 02/02/19   Freeman Caldron M, PA-C  omeprazole (PRILOSEC) 20 MG capsule Take 1 capsule (20 mg total) by mouth daily. 02/02/19   Argentina Donovan, PA-C     Objective:  EXAM:   Vitals:   02/02/19 1458  BP: 135/74  Pulse: 65  Resp: 18  Temp: 98.2 F (36.8 C)  TempSrc: Oral  SpO2: 99%  Weight: 153 lb (69.4 kg)  Height: 5\' 5"  (1.651 m)    General appearance : A&OX3. NAD. Non-toxic-appearing HEENT: Atraumatic and Normocephalic.  PERRLA. EOM intact.   Neck: supple, no JVD. No cervical lymphadenopathy. No thyromegaly Chest/Lungs:  Breathing-non-labored, Good air entry bilaterally, breath sounds normal without rales, rhonchi, or wheezing  CVS: S1 S2 regular, no murmurs, gallops, rubs  Abdomen: Bowel sounds present, mildy tender in midepigastric area and not distended with no gaurding, rigidity or rebound. Back-no spiny TTP.  paraspinus muscles with spasm in thoracolumbar  region.  No CVA TTP.  DTR BLE=intact B.   Extremities: Bilateral Lower Ext shows no edema, both legs are warm to touch with = pulse throughout Neurology:  CN II-XII grossly intact, Non focal.   Psych:  TP linear. J/I WNL. Normal speech. Appropriate eye contact and affect.  Skin:  No Rash  Data Review Lab Results  Component Value Date   HGBA1C 5.6 05/18/2014   HGBA1C 5.4 10/12/2012     Assessment & Plan   1. Stomach pain Non acute abdomen - POCT URINALYSIS DIP (CLINITEK) - Urine culture - H. pylori breath test - Comprehensive metabolic panel - omeprazole (PRILOSEC) 20 MG capsule; Take 1 capsule (20 mg total) by mouth daily.  Dispense: 30 capsule; Refill: 3  2. Easy bruising - CBC with Differential  3. Acute bilateral thoracic back pain No red flags-likely musculoskeletal - Urine culture - DG Thoracic Spine 2 View; Future - Vitamin D, 25-hydroxy - meloxicam (MOBIC) 15 MG tablet; Take 1 tablet (15 mg total) by mouth daily. X 1 week the prn pain  Dispense: 30 tablet; Refill: 1 - methocarbamol (ROBAXIN) 500 MG tablet; Take 1 tablet (500 mg total) by mouth 4 (four) times daily. X 7 days then prn muscle spasm  Dispense: 90 tablet; Refill: 0  4. Screening cholesterol level Per patient request - Lipid panel     Patient have been counseled extensively about nutrition and exercise  Return in about 2 months (around 04/04/2019) for PCP-chronic conditions-sooner if needed.  The patient was given clear instructions to go to ER or return to medical center if symptoms don't improve, worsen or new problems develop. The patient verbalized understanding. The patient was told to call to get lab results if they haven't heard anything in the next week.     Freeman Caldron, PA-C [facility] [place], [place]   02/02/2019, 3:20 PM

## 2019-02-03 ENCOUNTER — Other Ambulatory Visit: Payer: Self-pay | Admitting: Physician Assistant

## 2019-02-03 LAB — COMPREHENSIVE METABOLIC PANEL
ALT: 18 IU/L (ref 0–32)
AST: 16 IU/L (ref 0–40)
Albumin/Globulin Ratio: 1.7 (ref 1.2–2.2)
Albumin: 4.6 g/dL (ref 3.8–4.8)
Alkaline Phosphatase: 124 IU/L — ABNORMAL HIGH (ref 39–117)
BUN/Creatinine Ratio: 30 — ABNORMAL HIGH (ref 9–23)
BUN: 17 mg/dL (ref 6–24)
Bilirubin Total: 0.6 mg/dL (ref 0.0–1.2)
CO2: 24 mmol/L (ref 20–29)
Calcium: 9.6 mg/dL (ref 8.7–10.2)
Chloride: 103 mmol/L (ref 96–106)
Creatinine, Ser: 0.56 mg/dL — ABNORMAL LOW (ref 0.57–1.00)
GFR calc Af Amer: 132 mL/min/{1.73_m2} (ref >59)
GFR calc non Af Amer: 115 mL/min/{1.73_m2} (ref >59)
Globulin, Total: 2.7 g/dL (ref 1.5–4.5)
Glucose: 82 mg/dL (ref 65–99)
Potassium: 4.1 mmol/L (ref 3.5–5.2)
Sodium: 142 mmol/L (ref 134–144)
Total Protein: 7.3 g/dL (ref 6.0–8.5)

## 2019-02-03 LAB — CBC WITH DIFFERENTIAL/PLATELET
Basophils Absolute: 0 10*3/uL (ref 0.0–0.2)
Basos: 1 %
EOS (ABSOLUTE): 0.1 10*3/uL (ref 0.0–0.4)
Eos: 1 %
Hematocrit: 41.5 % (ref 34.0–46.6)
Hemoglobin: 14 g/dL (ref 11.1–15.9)
Immature Grans (Abs): 0 10*3/uL (ref 0.0–0.1)
Immature Granulocytes: 0 %
Lymphocytes Absolute: 2.5 10*3/uL (ref 0.7–3.1)
Lymphs: 33 %
MCH: 29.9 pg (ref 26.6–33.0)
MCHC: 33.7 g/dL (ref 31.5–35.7)
MCV: 89 fL (ref 79–97)
Monocytes Absolute: 0.4 10*3/uL (ref 0.1–0.9)
Monocytes: 6 %
Neutrophils Absolute: 4.4 10*3/uL (ref 1.4–7.0)
Neutrophils: 59 %
Platelets: 176 10*3/uL (ref 150–450)
RBC: 4.69 x10E6/uL (ref 3.77–5.28)
RDW: 12.9 % (ref 11.7–15.4)
WBC: 7.4 10*3/uL (ref 3.4–10.8)

## 2019-02-03 LAB — VITAMIN D 25 HYDROXY (VIT D DEFICIENCY, FRACTURES): Vit D, 25-Hydroxy: 24.4 ng/mL — ABNORMAL LOW (ref 30.0–100.0)

## 2019-02-03 LAB — LIPID PANEL
Chol/HDL Ratio: 3 ratio (ref 0.0–4.4)
Cholesterol, Total: 156 mg/dL (ref 100–199)
HDL: 52 mg/dL (ref >39)
LDL Chol Calc (NIH): 84 mg/dL (ref 0–99)
Triglycerides: 108 mg/dL (ref 0–149)
VLDL Cholesterol Cal: 20 mg/dL (ref 5–40)

## 2019-02-03 MED ORDER — VITAMIN D (ERGOCALCIFEROL) 1.25 MG (50000 UNIT) PO CAPS: 50000.0000 [IU] | ORAL_CAPSULE | ORAL | 0 refills | Status: DC

## 2019-02-04 LAB — URINE CULTURE

## 2019-02-04 LAB — H. PYLORI BREATH TEST: H pylori Breath Test: NEGATIVE

## 2019-02-05 ENCOUNTER — Ambulatory Visit (HOSPITAL_COMMUNITY)
Admission: RE | Admit: 2019-02-05 | Discharge: 2019-02-05 | Disposition: A | Discharge status: Home / Self Care | Payer: No Typology Code available for payment source | Source: Ambulatory Visit | Attending: Physician Assistant | Admitting: Physician Assistant

## 2019-02-05 ENCOUNTER — Other Ambulatory Visit: Payer: Self-pay

## 2019-02-05 DIAGNOSIS — M546 Pain in thoracic spine: Secondary | ICD-10-CM | POA: Insufficient documentation

## 2019-02-08 ENCOUNTER — Other Ambulatory Visit: Payer: Self-pay

## 2019-02-08 ENCOUNTER — Ambulatory Visit: Payer: Self-pay | Attending: Nurse Practitioner

## 2019-03-15 ENCOUNTER — Telehealth: Payer: Self-pay | Admitting: Nurse Practitioner

## 2019-03-15 NOTE — Telephone Encounter (Signed)
Pt was sent a letter from financial dept. Inform them, that the application they submitted was incomplete, since they were missing some documentation at the time of the appointment, Pt need to reschedule and resubmit all new papers and application for CAFA and OC, P.S. old documents has been sent back by mail to the Pt and Pt. need to make a new appt. 

## 2019-07-21 ENCOUNTER — Ambulatory Visit: Payer: Self-pay | Attending: Nurse Practitioner | Admitting: Physician Assistant

## 2019-07-21 ENCOUNTER — Other Ambulatory Visit: Payer: Self-pay

## 2019-07-21 VITALS — BP 99/65 | HR 73 | Ht 65.0 in | Wt 155.8 lb

## 2019-07-21 DIAGNOSIS — Z789 Other specified health status: Secondary | ICD-10-CM

## 2019-07-21 DIAGNOSIS — R238 Other skin changes: Secondary | ICD-10-CM

## 2019-07-21 DIAGNOSIS — R109 Unspecified abdominal pain: Secondary | ICD-10-CM

## 2019-07-21 DIAGNOSIS — M546 Pain in thoracic spine: Secondary | ICD-10-CM

## 2019-07-21 DIAGNOSIS — R233 Spontaneous ecchymoses: Secondary | ICD-10-CM

## 2019-07-21 DIAGNOSIS — E559 Vitamin D deficiency, unspecified: Secondary | ICD-10-CM

## 2019-07-21 MED ORDER — OMEPRAZOLE 20 MG PO CPDR: 20.0000 mg | DELAYED_RELEASE_CAPSULE | Freq: Every day | ORAL | 3 refills | Status: DC

## 2019-07-21 MED ORDER — METHOCARBAMOL 500 MG PO TABS: 500.0000 mg | ORAL_TABLET | Freq: Four times a day (QID) | ORAL | 0 refills | Status: DC

## 2019-07-21 MED ORDER — MELOXICAM 15 MG PO TABS: 15.0000 mg | ORAL_TABLET | Freq: Every day | ORAL | 1 refills | Status: DC

## 2019-07-21 NOTE — Patient Instructions (Signed)
Opciones de alimentos para pacientes adultos con enfermedad de reflujo gastroesofgico Food Choices for Gastroesophageal Reflux Disease, Adult Si tiene enfermedad de reflujo gastroesofgico (ERGE), los alimentos que consume y los hbitos de alimentacin son muy importantes. Elegir los alimentos adecuados puede ayudar a aliviar las molestias. Piense en consultar a un especialista en nutricin (nutricionista) para que lo ayude a hacer buenas elecciones. Consejos para seguir este plan  Comidas  Elija alimentos saludables con bajo contenido de grasa, como frutas, verduras, cereales integrales, productos lcteos descremados y carne magra de vaca, de pescado y de ave.  Haga comidas pequeas durante el da en lugar de 3 comidas abundantes. Coma lentamente y en un lugar donde est distendido. Evite agacharse o recostarse hasta 2 o 3horas despus de haber comido.  Evite comer 2 a 3horas antes de ir a acostarse.  Evite beber grandes cantidades de lquidos con las comidas.  Evite frer los alimentos a la hora de la coccin. Puede hornear, grillar o asar a la parrilla.  Evite o limite la cantidad de: ? Chocolate. ? Menta y mentol. ? Alcohol. ? Pimienta. ? Caf negro y descafeinado. ? T negro y descafeinado. ? Bebidas con gas (gaseosas). ? Bebidas energizantes y refrescos que contengan cafena.  Limite los alimentos con alto contenido de grasas, por ejemplo: ? Carnes grasas o alimentos fritos. ? Leche entera, crema, manteca o helado. ? Nueces y mantequillas de frutos secos. ? Pastelera, donas y dulces hechos con manteca o margarina.  Evite los alimentos que le ocasionen sntomas. Estos pueden ser distintos para cada persona. Los alimentos que suelen causan sntomas son los siguientes: ? Tomates. ? Naranjas, limones y limas. ? Pimientos. ? Comidas condimentadas. ? Cebolla y ajo. ? Vinagre. Estilo de vida  Mantenga un peso saludable. Pregntele a su mdico cul es el peso saludable  para usted. Si necesita perder peso, hable con su mdico para hacerlo de manera segura.  Realice actividad fsica durante, al menos, 30 minutos 5 das por semana o ms, o segn lo indicado por su mdico.  Use ropa suelta.  No fume. Si necesita ayuda para dejar de fumar, consulte al mdico.  Duerma con la cabecera de la cama ms elevada que los pies. Use una cua debajo del colchn o bloques debajo del armazn de la cama para mantener la cabecera de la cama elevada. Resumen  Si tiene enfermedad de reflujo gastroesofgico (ERGE), las elecciones de alimentos y el estilo de vida son muy importantes para ayudar a aliviar los sntomas.  Haga comidas pequeas durante el da en lugar de 3 comidas abundantes. Coma lentamente y en un lugar donde est distendido.  Limite los alimentos con alto contenido graso como la carne grasa o los alimentos fritos.  Evite agacharse o recostarse hasta 2 o 3horas despus de haber comido.  Evite la menta y hierba buena, la cafena, el alcohol y el chocolate. Esta informacin no tiene como fin reemplazar el consejo del mdico. Asegrese de hacerle al mdico cualquier pregunta que tenga. Document Revised: 10/01/2016 Document Reviewed: 10/01/2016 Elsevier Patient Education  2020 [employer].  

## 2019-07-21 NOTE — Progress Notes (Signed)
Patient ID: Danielle [name], female   DOB: 09-06-1975, 44 y.o.   MRN: [id number]   Danielle [name], is a 44 y.o. female  [id number]  [id number]  DOB - [birth year]  Subjective:  Chief Complaint and HPI: Danielle [name] is a 44 y.o. female here today with several issues.  RUQ and stomach pain that won't go away.  She is off of omeprazole.  No N/V/D.  No early satiety.  No melena or hematochezia.  This has been going on for 2 weeks.  Worse after eating but appetite is good.  No diarrhea or constipation.  No urinary s/sx.  No fever.    Also having thoracolumbar back pain and is out of the muscle relaxers.  These have helped in the past.  C/o easy bruising.  No nose bleeds, bleeding gums  Wants to recheck vitamin D.    ROS:   Constitutional:  No f/c, No night sweats, No unexplained weight loss. EENT:  No vision changes, No blurry vision, No hearing changes. No mouth, throat, or ear problems.  Respiratory: No cough, No SOB Cardiac: No CP, no palpitations GI:  See above GU: No Urinary s/sx Musculoskeletal: +back pain Neuro: No headache, no dizziness, no motor weakness.  Skin: No rash Endocrine:  No polydipsia. No polyuria.  Psych: Denies SI/HI  No problems updated.  ALLERGIES: Allergies  Allergen Reactions  . Ampicillin Rash    Has patient had a PCN reaction causing immediate rash, facial/tongue/throat swelling, SOB or lightheadedness with hypotension: Yes Has patient had a PCN reaction causing severe rash involving mucus membranes or skin necrosis: Unk Has patient had a PCN reaction that required hospitalization: No Has patient had a PCN reaction occurring within the last 10 years: No If all of the above answers are "NO", then may proceed with Cephalosporin use.   Marland Kitchen Penicillins Rash    Has patient had a PCN reaction causing immediate rash, facial/tongue/throat swelling, SOB or lightheadedness with hypotension: Yes Has patient had a PCN reaction causing severe rash involving  mucus membranes or skin necrosis: Unk Has patient had a PCN reaction that required hospitalization: No Has patient had a PCN reaction occurring within the last 10 years: No If all of the above answers are "NO", then may proceed with Cephalosporin use.     PAST MEDICAL HISTORY: Past Medical History:  Diagnosis Date  . History of blood transfusion 2007   "related to OR"    MEDICATIONS AT HOME: Prior to Admission medications   Medication Sig Start Date End Date Taking? Authorizing Provider  citalopram (CELEXA) 20 MG tablet Take 1 tablet (20 mg total) by mouth daily. Patient not taking: Reported on 07/21/2019 10/07/18   Argentina Donovan, PA-C  gabapentin (NEURONTIN) 100 MG capsule Take 1 capsule (100 mg total) by mouth at bedtime. 05/18/18 02/02/19  Gildardo Pounds, NP  meloxicam (MOBIC) 15 MG tablet Take 1 tablet (15 mg total) by mouth daily. X 1 week the prn pain 07/21/19   Argentina Donovan, PA-C  methocarbamol (ROBAXIN) 500 MG tablet Take 1 tablet (500 mg total) by mouth 4 (four) times daily. X 7 days then prn muscle spasm 07/21/19   Argentina Donovan, PA-C  omeprazole (PRILOSEC) 20 MG capsule Take 1 capsule (20 mg total) by mouth daily. 07/21/19   Argentina Donovan, PA-C  Vitamin D, Ergocalciferol, (DRISDOL) 1.25 MG (50000 UT) CAPS capsule Take 1 capsule (50,000 Units total) by mouth every 7 (seven) days. Patient not taking: Reported on 07/21/2019 02/03/19  Argentina Donovan, PA-C     Objective:  EXAM:   Vitals:   07/21/19 1435  BP: 99/65  Pulse: 73  SpO2: 98%  Weight: 155 lb 12.8 oz (70.7 kg)  Height: 5\' 5"  (1.651 m)    General appearance : A&OX3. NAD. Non-toxic-appearing HEENT: Atraumatic and Normocephalic.  PERRLA. EOM intact.  Neck: supple, no JVD. No cervical lymphadenopathy. No thyromegaly Chest/Lungs:  Breathing-non-labored, Good air entry bilaterally, breath sounds normal without rales, rhonchi, or wheezing  CVS: S1 S2 regular, no murmurs, gallops, rubs  Abdomen: Bowel  sounds present, Non tender and not distended with mild gaurding in RUQ and midepigastric area, no rigidity or rebound. Extremities: Bilateral Lower Ext shows no edema, both legs are warm to touch with = pulse throughout Back:  paraspinus spasm R thoracolumbar region.  Neg SLR B.  DTR=intact B Neurology:  CN II-XII grossly intact, Non focal.   Psych:  TP linear. J/I WNL. Normal speech. Appropriate eye contact and affect.  Skin:  No Rash  Data Review Lab Results  Component Value Date   HGBA1C 5.6 05/18/2014   HGBA1C 5.4 10/12/2012     Assessment & Plan   1. Stomach pain -non acute abdomen - omeprazole (PRILOSEC) 20 MG capsule; Take 1 capsule (20 mg total) by mouth daily.  Dispense: 30 capsule; Refill: 3 - Comprehensive metabolic panel - US Abdomen Complete; Future  2. Hyperbilirubinemia H/o elevated LFT - Comprehensive metabolic panel - US Abdomen Complete; Future  3. Acute bilateral thoracic back pain No red flags on exam.  Reflexes =B - methocarbamol (ROBAXIN) 500 MG tablet; Take 1 tablet (500 mg total) by mouth 4 (four) times daily. X 7 days then prn muscle spasm  Dispense: 90 tablet; Refill: 0 - meloxicam (MOBIC) 15 MG tablet; Take 1 tablet (15 mg total) by mouth daily. X 1 week the prn pain  Dispense: 30 tablet; Refill: 1  4. Easy bruising No unusual bruising - CBC with Differential/Platelet  5. Vitamin D deficiency - Vitamin D, 25-hydroxy  6. Language barrier [employer] interpreters(Luis) used and additional time performing visit was required.   Patient have been counseled extensively about nutrition and exercise  Return in about 3 months (around 10/21/2019) for PCP;  chronic conditions.  The patient was given clear instructions to go to ER or return to medical center if symptoms don't improve, worsen or new problems develop. The patient verbalized understanding. The patient was told to call to get lab results if they haven't heard anything in the next week.     Freeman Caldron, PA-C [facility] [place], [place]   07/21/2019, 2:48 PM

## 2019-07-22 ENCOUNTER — Other Ambulatory Visit: Payer: Self-pay | Admitting: Physician Assistant

## 2019-07-22 LAB — CBC WITH DIFFERENTIAL/PLATELET
Basophils Absolute: 0.1 10*3/uL (ref 0.0–0.2)
Basos: 1 %
EOS (ABSOLUTE): 0.1 10*3/uL (ref 0.0–0.4)
Eos: 2 %
Hematocrit: 42.5 % (ref 34.0–46.6)
Hemoglobin: 14.4 g/dL (ref 11.1–15.9)
Immature Grans (Abs): 0 10*3/uL (ref 0.0–0.1)
Immature Granulocytes: 0 %
Lymphocytes Absolute: 2 10*3/uL (ref 0.7–3.1)
Lymphs: 38 %
MCH: 29.4 pg (ref 26.6–33.0)
MCHC: 33.9 g/dL (ref 31.5–35.7)
MCV: 87 fL (ref 79–97)
Monocytes Absolute: 0.2 10*3/uL (ref 0.1–0.9)
Monocytes: 5 %
Neutrophils Absolute: 2.9 10*3/uL (ref 1.4–7.0)
Neutrophils: 54 %
Platelets: 203 10*3/uL (ref 150–450)
RBC: 4.9 x10E6/uL (ref 3.77–5.28)
RDW: 12.9 % (ref 11.7–15.4)
WBC: 5.3 10*3/uL (ref 3.4–10.8)

## 2019-07-22 LAB — COMPREHENSIVE METABOLIC PANEL
ALT: 17 IU/L (ref 0–32)
AST: 17 IU/L (ref 0–40)
Albumin/Globulin Ratio: 1.6 (ref 1.2–2.2)
Albumin: 4.5 g/dL (ref 3.8–4.8)
Alkaline Phosphatase: 132 IU/L — ABNORMAL HIGH (ref 39–117)
BUN/Creatinine Ratio: 23 (ref 9–23)
BUN: 15 mg/dL (ref 6–24)
Bilirubin Total: 0.7 mg/dL (ref 0.0–1.2)
CO2: 25 mmol/L (ref 20–29)
Calcium: 9.6 mg/dL (ref 8.7–10.2)
Chloride: 104 mmol/L (ref 96–106)
Creatinine, Ser: 0.65 mg/dL (ref 0.57–1.00)
GFR calc Af Amer: 126 mL/min/{1.73_m2} (ref >59)
GFR calc non Af Amer: 109 mL/min/{1.73_m2} (ref >59)
Globulin, Total: 2.9 g/dL (ref 1.5–4.5)
Glucose: 96 mg/dL (ref 65–99)
Potassium: 4.4 mmol/L (ref 3.5–5.2)
Sodium: 145 mmol/L — ABNORMAL HIGH (ref 134–144)
Total Protein: 7.4 g/dL (ref 6.0–8.5)

## 2019-07-22 LAB — VITAMIN D 25 HYDROXY (VIT D DEFICIENCY, FRACTURES): Vit D, 25-Hydroxy: 25.8 ng/mL — ABNORMAL LOW (ref 30.0–100.0)

## 2019-07-22 MED ORDER — VITAMIN D (ERGOCALCIFEROL) 1.25 MG (50000 UNIT) PO CAPS: 50000.0000 [IU] | ORAL_CAPSULE | ORAL | 0 refills | Status: DC

## 2019-07-26 ENCOUNTER — Ambulatory Visit (HOSPITAL_COMMUNITY): Payer: Self-pay

## 2019-07-28 ENCOUNTER — Other Ambulatory Visit: Payer: Self-pay

## 2019-07-28 ENCOUNTER — Ambulatory Visit (HOSPITAL_COMMUNITY)
Admission: RE | Admit: 2019-07-28 | Discharge: 2019-07-28 | Disposition: A | Discharge status: Home / Self Care | Payer: Self-pay | Source: Ambulatory Visit | Attending: Physician Assistant | Admitting: Physician Assistant

## 2019-07-28 DIAGNOSIS — R109 Unspecified abdominal pain: Secondary | ICD-10-CM | POA: Insufficient documentation

## 2019-08-23 IMAGING — CT CT ABD-PELV W/ CM
2 of 5 series · 16 of 46 positions shown, 18 images · IV contrast (Omni 300)
Comparison: 04/07/2017

CLINICAL DATA: Abdominal pain and distension following recent
cholecystectomy

EXAM:
CT ABDOMEN AND PELVIS WITH CONTRAST
TECHNIQUE: Multidetector CT imaging of the abdomen and pelvis was performed
using the standard protocol following bolus administration of
intravenous contrast.
CONTRAST:  100mL 6UXBNF-BXX IOPAMIDOL (6UXBNF-BXX) INJECTION 61%

[Series 3: a/p w/ 5mm · axial · 0.80mm/px · z∈[+721,+1131]mm · 13 of 94 slices shown, 15 images]
[im 6/94  soft-tissue]
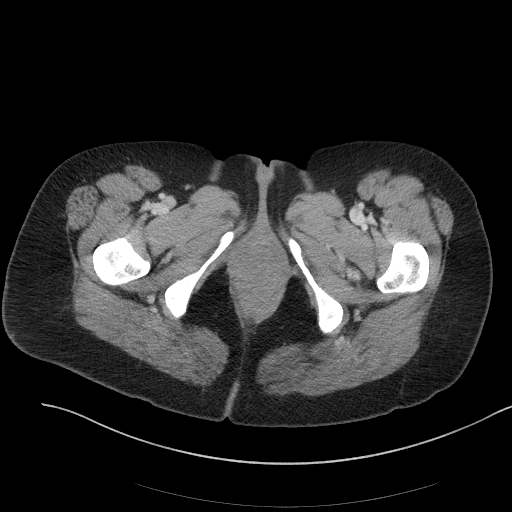
[im 6/94  bone]
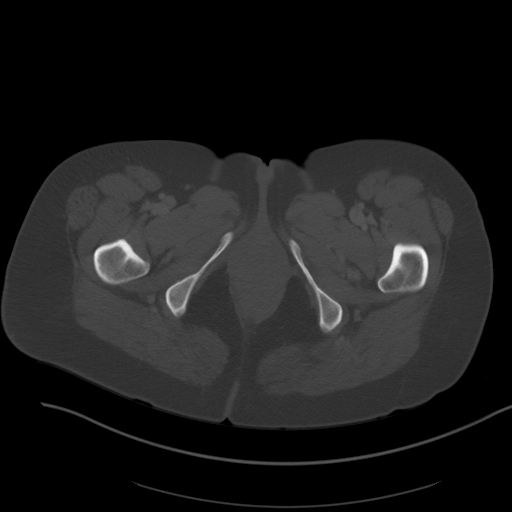
[im 11/94  soft-tissue]
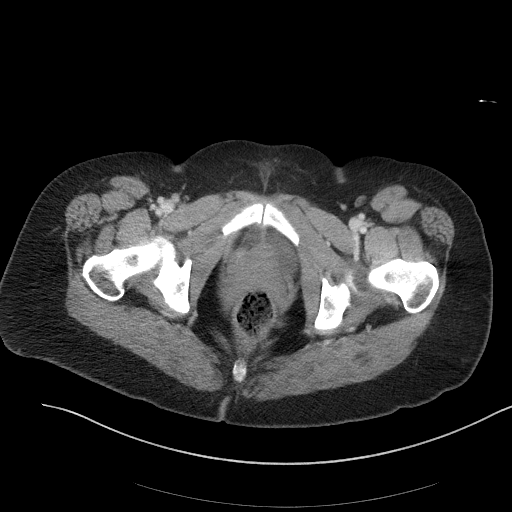
[im 21/94  soft-tissue]
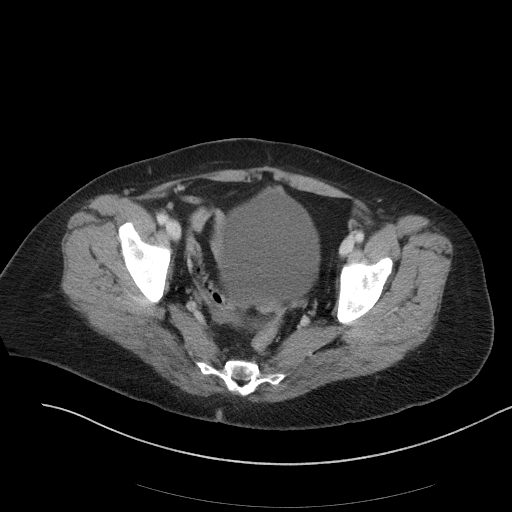
[im 26/94  soft-tissue]
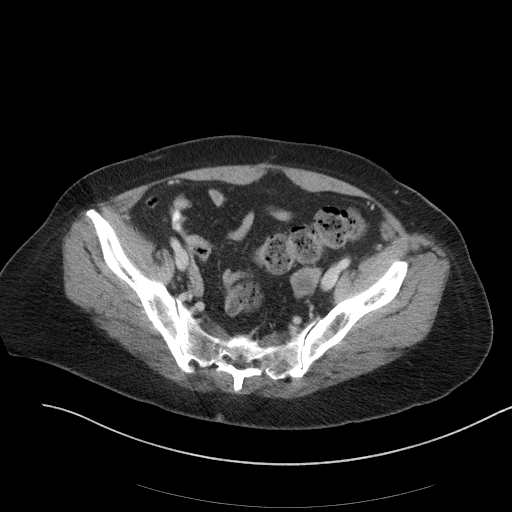
[im 32/94  soft-tissue]
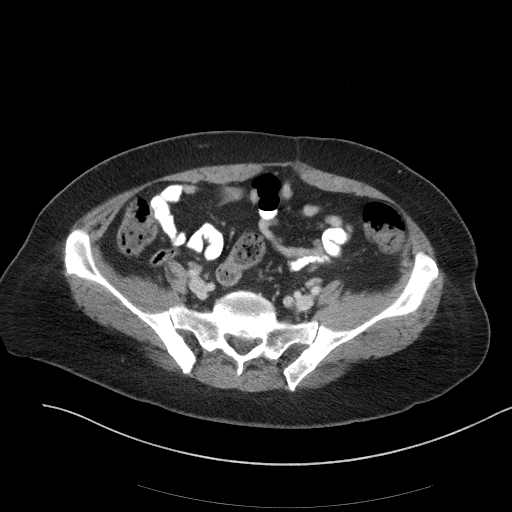
[im 42/94  soft-tissue]
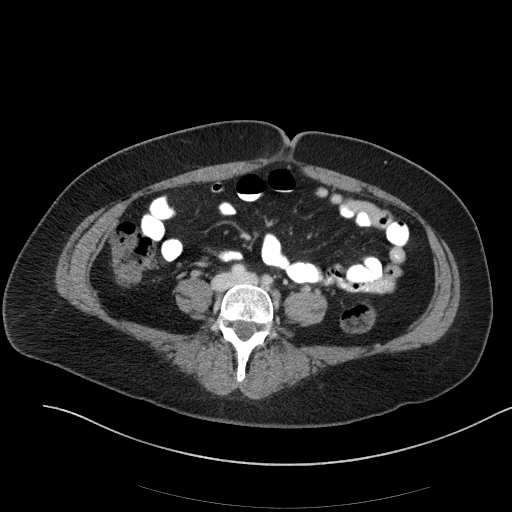
[im 47/94  soft-tissue]
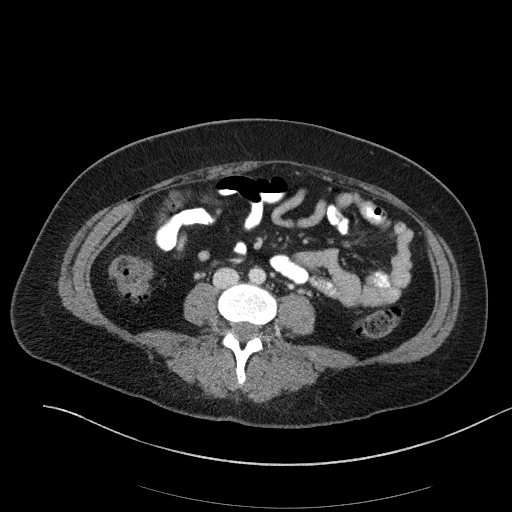
[im 52/94  soft-tissue]
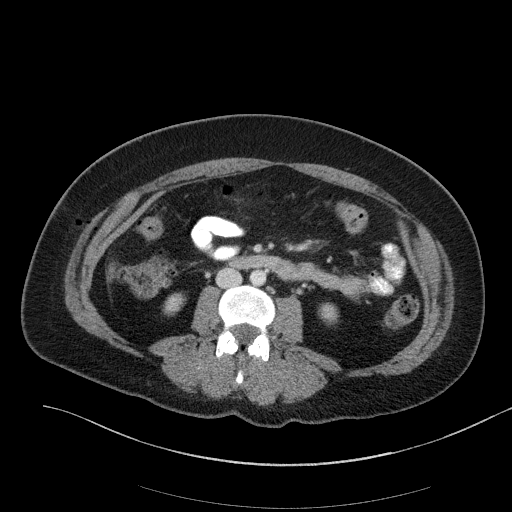
[im 63/94  soft-tissue]
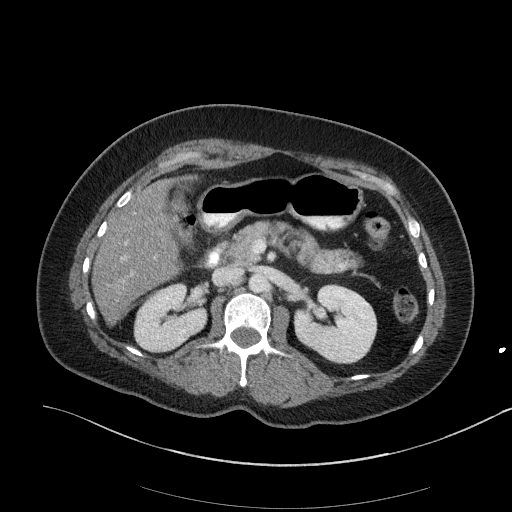
[im 63/94  bone]
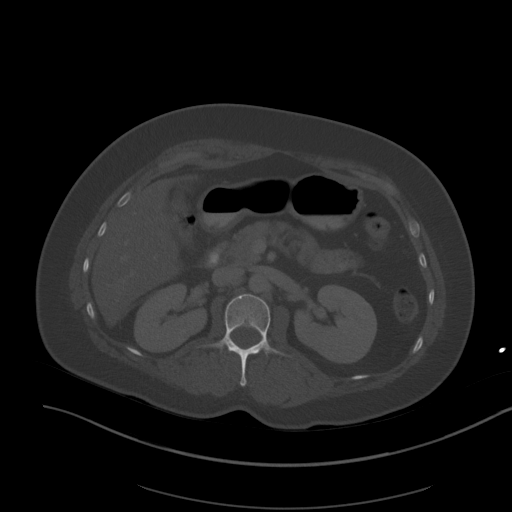
[im 68/94  soft-tissue]
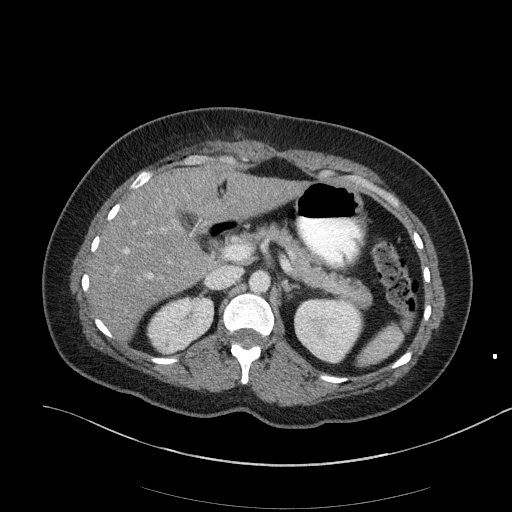
[im 73/94  soft-tissue]
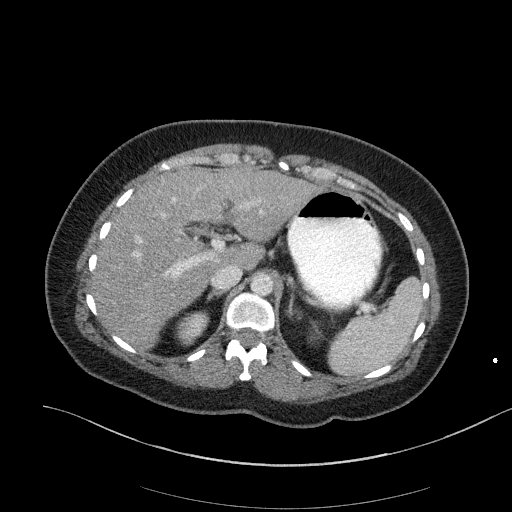
[im 83/94  soft-tissue]
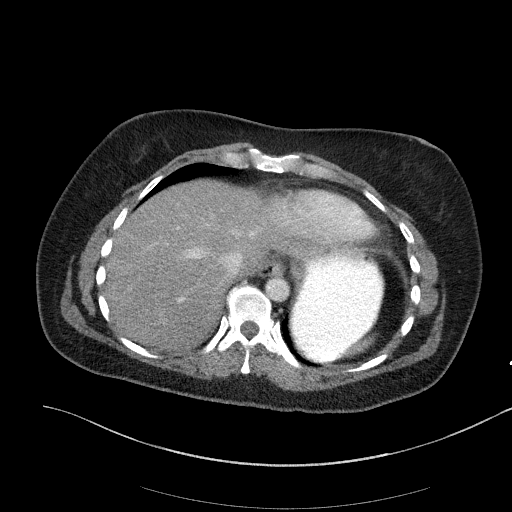
[im 88/94  soft-tissue]
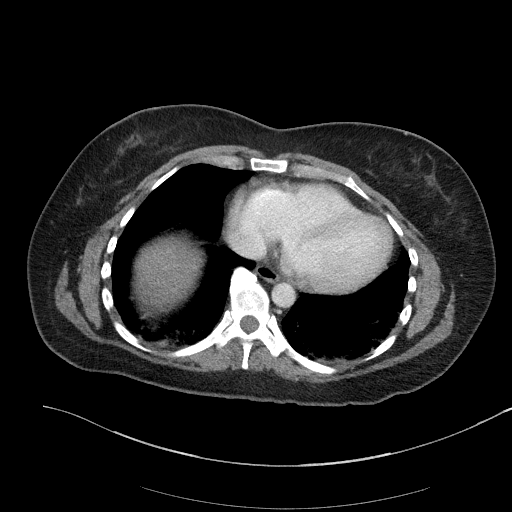

[Series 6: a/p w/ cor · coronal · 0.73mm/px · 3 of 130 slices shown]
[im 44/130  soft-tissue]
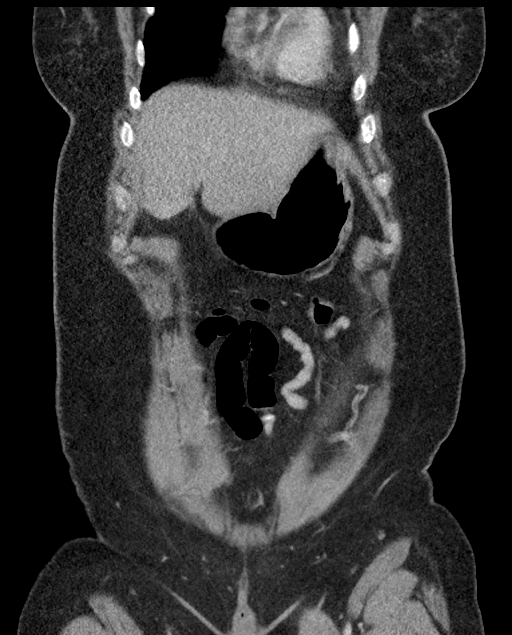
[im 58/130  soft-tissue]
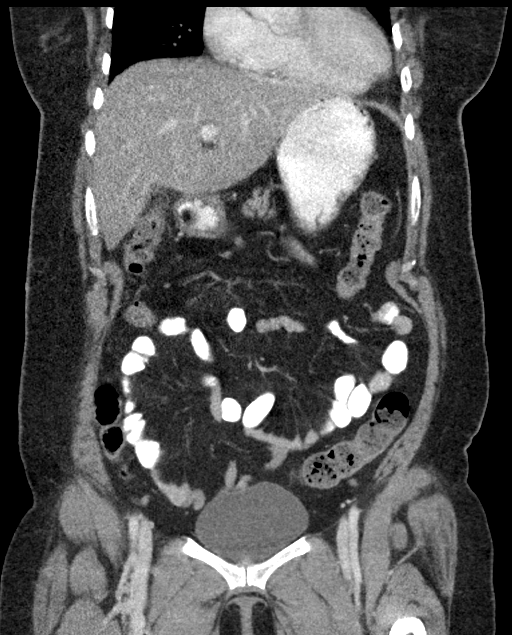
[im 72/130  soft-tissue]
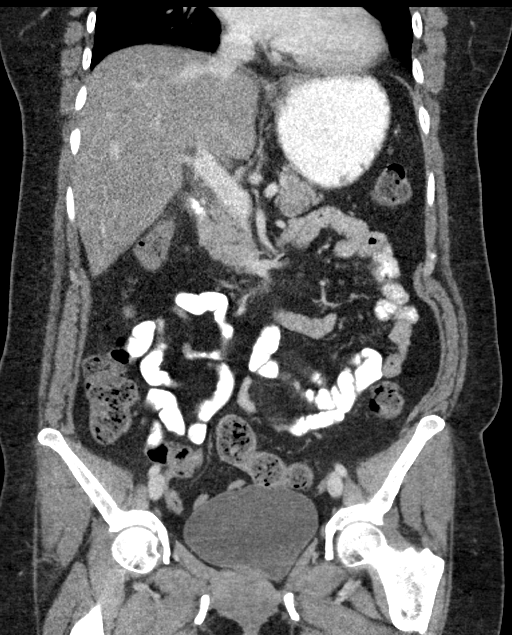

[16 of 46 positions shown; findings below may reference images not displayed]

FINDINGS: Lower chest: Mild bibasilar atelectatic changes are noted.

Hepatobiliary: The liver is diffusely fatty infiltrated. The
gallbladder has been surgically removed. No fluid collection is
noted in the gallbladder fossa.

Pancreas: Unremarkable. No pancreatic ductal dilatation or
surrounding inflammatory changes.

Spleen: Normal in size without focal abnormality.

Adrenals/Urinary Tract: Adrenal glands are unremarkable. Kidneys are
normal, without renal calculi, focal lesion, or hydronephrosis.
Bladder is unremarkable.

Stomach/Bowel: Scattered diverticular changes noted. The appendix is
within normal limits. Some very mild pericolonic inflammatory
changes noted in the right mid abdomen which may represent some
early diverticulitis. No perforation or abscess formation is seen.

Vascular/Lymphatic: No significant vascular findings are present. No
enlarged abdominal or pelvic lymph nodes.

Reproductive: Status post hysterectomy. No adnexal masses.

Other: No abdominal wall hernia or abnormality. No abdominopelvic
ascites.

Musculoskeletal: Degenerative changes of lumbar spine are noted.
IMPRESSION: Status post cholecystectomy. No findings to suggest significant bile
duct leak or involving abscess are seen.

Mild pericolonic inflammatory changes in an area of diverticulosis.
This may represent some early diverticulitis. This could also be
related to the recent surgery.

No other focal abnormality is noted.

## 2019-08-24 ENCOUNTER — Ambulatory Visit: Payer: Self-pay | Attending: Nurse Practitioner

## 2019-08-24 ENCOUNTER — Other Ambulatory Visit: Payer: Self-pay

## 2019-09-07 ENCOUNTER — Telehealth: Payer: Self-pay | Admitting: Nurse Practitioner

## 2019-09-07 NOTE — Telephone Encounter (Signed)
Pt was sent a letter from financial dept. Inform them, that the application they submitted was incomplete, since they were missing some documentation at the time of the appointment, Pt need to reschedule and resubmit all new papers and application for CAFA and OC, P.S. old documents has been sent back by mail to the Pt and Pt. need to make a new appt. 

## 2019-10-14 ENCOUNTER — Other Ambulatory Visit: Payer: Self-pay | Admitting: Family Medicine

## 2019-10-14 DIAGNOSIS — M62838 Other muscle spasm: Secondary | ICD-10-CM

## 2019-12-23 ENCOUNTER — Other Ambulatory Visit: Payer: Self-pay | Admitting: Obstetrics and Gynecology

## 2019-12-23 DIAGNOSIS — Z1231 Encounter for screening mammogram for malignant neoplasm of breast: Secondary | ICD-10-CM

## 2020-01-03 ENCOUNTER — Other Ambulatory Visit: Payer: Self-pay

## 2020-01-03 DIAGNOSIS — Z20822 Contact with and (suspected) exposure to covid-19: Secondary | ICD-10-CM

## 2020-01-04 ENCOUNTER — Telehealth: Payer: Self-pay | Admitting: Nurse Practitioner

## 2020-01-04 LAB — NOVEL CORONAVIRUS, NAA: SARS-CoV-2, NAA: NOT DETECTED

## 2020-01-04 LAB — SARS-COV-2, NAA 2 DAY TAT

## 2020-01-04 NOTE — Telephone Encounter (Signed)
Negative COVID results given. Patient results "NOT Detected." Caller expressed understanding. ° °

## 2020-02-29 ENCOUNTER — Ambulatory Visit
Admission: RE | Admit: 2020-02-29 | Discharge: 2020-02-29 | Disposition: A | Discharge status: Home / Self Care | Payer: No Typology Code available for payment source | Source: Ambulatory Visit | Attending: Obstetrics and Gynecology | Admitting: Obstetrics and Gynecology

## 2020-02-29 ENCOUNTER — Ambulatory Visit: Payer: No Typology Code available for payment source | Admitting: *Deleted

## 2020-02-29 ENCOUNTER — Other Ambulatory Visit: Payer: Self-pay

## 2020-02-29 VITALS — BP 106/70 | Wt 155.5 lb

## 2020-02-29 DIAGNOSIS — Z1239 Encounter for other screening for malignant neoplasm of breast: Secondary | ICD-10-CM

## 2020-02-29 DIAGNOSIS — Z1231 Encounter for screening mammogram for malignant neoplasm of breast: Secondary | ICD-10-CM

## 2020-02-29 NOTE — Progress Notes (Signed)
Ms. Danielle [name] is a 44 y.o. female who presents to [facility] clinic today with no complaints.    Pap Smear: Pap smear not completed today. Last Pap smear was 05/05/2017 at [facility] and normal with negative HPV. Per patient has no history of an abnormal Pap smear. Patient has a history of a supra cervical hysterectomy in 2007 due to AUB from postpartum complications after her c-section. Last two Pap smear results are in Epic.  Physical exam: Breasts Breasts symmetrical. No skin abnormalities bilateral breasts. No nipple retraction bilateral breasts. No nipple discharge bilateral breasts. No lymphadenopathy. No lumps palpated bilateral breasts. No complaints of pain or tenderness on exam.       Pelvic/Bimanual Pap is not indicated today per BCCCP guidelines.    Smoking History: Patient has never smoked.    Patient Navigation: Patient education provided. Access to services provided for patient through [facility] program. Spanish interpreter Rudene Anda from [facility] provided.    Breast and Cervical Cancer Risk Assessment: Patient does not have family history of breast cancer, known genetic mutations, or radiation treatment to the chest before age 8. Patient does not have history of cervical dysplasia, immunocompromised, or DES exposure in-utero.  Risk Assessment    Risk Scores      02/29/2020 06/23/2018   Last edited by: Demetrius Revel, LPN Rolena Infante H, LPN   5-year risk: 0.5 % 0.4 %   Lifetime risk: 6.2 % 6.3 %         A: BCCCP exam without pap smear No complaints.  P: Referred patient to the [facility] for a screening mammogram. Appointment scheduled Tuesday, February 29, 2020 at 1330.  Loletta Parish, RN 02/29/2020 12:55 PM

## 2020-02-29 NOTE — Patient Instructions (Signed)
Explained breast self awareness Danielle [name]. Patient did not need a Pap smear today due to last Pap smear and HPV typing was 05/05/2017. Let her know BCCCP will cover Pap smears and HPV typing every 5 years unless has a history of abnormal Pap smears. Referred patient to the [facility] for a screening mammogram. Appointment scheduled Tuesday, February 29, 2020 at 1330. Patient escorted to the mobile unit following BCCCP appointment for her screening mammogram. Let patient know the [facility] will follow up with her within the next couple weeks with results of her mammogram by letter or phone. Danielle [name] verbalized understanding.  Grason Brailsford, Arvil Chaco, RN 12:55 PM

## 2020-09-13 ENCOUNTER — Other Ambulatory Visit: Payer: Self-pay

## 2020-09-13 ENCOUNTER — Ambulatory Visit: Payer: Self-pay | Attending: Nurse Practitioner | Admitting: Nurse Practitioner

## 2020-09-13 ENCOUNTER — Other Ambulatory Visit (HOSPITAL_COMMUNITY)
Admission: RE | Admit: 2020-09-13 | Discharge: 2020-09-13 | Disposition: A | Discharge status: Home / Self Care | Payer: No Typology Code available for payment source | Source: Ambulatory Visit | Attending: Nurse Practitioner | Admitting: Nurse Practitioner

## 2020-09-13 VITALS — BP 113/77 | HR 73 | Resp 16 | Wt 158.6 lb

## 2020-09-13 DIAGNOSIS — E559 Vitamin D deficiency, unspecified: Secondary | ICD-10-CM

## 2020-09-13 DIAGNOSIS — Z114 Encounter for screening for human immunodeficiency virus [HIV]: Secondary | ICD-10-CM

## 2020-09-13 DIAGNOSIS — Z124 Encounter for screening for malignant neoplasm of cervix: Secondary | ICD-10-CM

## 2020-09-13 DIAGNOSIS — T148XXA Other injury of unspecified body region, initial encounter: Secondary | ICD-10-CM

## 2020-09-13 DIAGNOSIS — M533 Sacrococcygeal disorders, not elsewhere classified: Secondary | ICD-10-CM

## 2020-09-13 MED ORDER — ACETAMINOPHEN-CODEINE #3 300-30 MG PO TABS
1.0000 | ORAL_TABLET | Freq: Three times a day (TID) | ORAL | 0 refills | Status: DC | PRN
  Filled 2020-09-13: qty 30, 10d supply, fill #0

## 2020-09-13 MED ORDER — TIZANIDINE HCL 4 MG PO TABS
4.0000 mg | ORAL_TABLET | Freq: Four times a day (QID) | ORAL | 1 refills | Status: DC | PRN
  Filled 2020-09-13: qty 30, 8d supply, fill #0
  Filled 2020-10-24: qty 30, 8d supply, fill #1

## 2020-09-13 NOTE — Progress Notes (Signed)
Assessment & Plan:  Danielle [name] was seen today for gynecologic exam.  Diagnoses and all orders for this visit:  Encounter for Papanicolaou smear for cervical cancer screening -     Cervicovaginal ancillary only -     Cytology - PAP  Encounter for screening for HIV -     HIV antibody (with reflex)  Sacral back pain -     DG Sacrum/Coccyx; Future -     acetaminophen-codeine (TYLENOL #3) 300-30 MG tablet; Take 1 tablet by mouth every 8 (eight) hours as needed for moderate pain. -     tiZANidine (ZANAFLEX) 4 MG tablet; Take 1 tablet (4 mg total) by mouth every 6 (six) hours as needed for muscle spasms.  Vitamin D deficiency disease -     VITAMIN D 25 Hydroxy (Vit-D Deficiency, Fractures)  Bruising -     CBC with Differential  Hyperbilirubinemia -     CMP14+EGFR   Patient has been counseled on age-appropriate routine health concerns for screening and prevention. These are reviewed and up-to-date. Referrals have been placed accordingly. Immunizations are up-to-date or declined.    Subjective:   Chief Complaint  Patient presents with   Gynecologic Exam   HPI Danielle [name] 45 y.o. female presents to office today for PAP smear  She is experiencing severe back pain. Endorses a sacral injury many years ago where she fractured her coccyx and required some sort of spinal procedure. [place] so she is unsure of the exact details and I have no imaging or records to review.   Review of Systems  Constitutional: Negative.  Negative for chills, fever, malaise/fatigue and weight loss.  Respiratory: Negative.  Negative for cough, shortness of breath and wheezing.   Cardiovascular: Negative.  Negative for chest pain, orthopnea and leg swelling.  Gastrointestinal:  Negative for abdominal pain.  Genitourinary: Negative.  Negative for flank pain.  Musculoskeletal:  Positive for back pain.  Skin: Negative.  Negative for rash.  Psychiatric/Behavioral:  Negative for  suicidal ideas.    Past Medical History:  Diagnosis Date   History of blood transfusion 2007   "related to OR"    Past Surgical History:  Procedure Laterality Date   ABDOMINAL HYSTERECTOMY  2007   CESAREAN SECTION  2007   CHOLECYSTECTOMY N/A 04/07/2017   Procedure: LAPAROSCOPIC CHOLECYSTECTOMY;  Surgeon: Kinsinger, Arta Bruce, MD;  Location: [facility];  Service: General;  Laterality: N/A;   LAPAROSCOPIC CHOLECYSTECTOMY  04/07/2017    Family History  Problem Relation Age of Onset   Thyroid disease Mother     Social History Reviewed with no changes to be made today.   Outpatient Medications Prior to Visit  Medication Sig Dispense Refill   citalopram (CELEXA) 20 MG tablet Take 1 tablet (20 mg total) by mouth daily. (Patient not taking: No sig reported) 30 tablet 3   gabapentin (NEURONTIN) 100 MG capsule Take 1 capsule (100 mg total) by mouth at bedtime. 90 capsule 3   meloxicam (MOBIC) 15 MG tablet Take 1 tablet (15 mg total) by mouth daily. X 1 week the prn pain (Patient not taking: Reported on 09/13/2020) 30 tablet 1   methocarbamol (ROBAXIN) 500 MG tablet Take 1 tablet (500 mg total) by mouth 4 (four) times daily. X 7 days then prn muscle spasm (Patient not taking: Reported on 09/13/2020) 90 tablet 0   omeprazole (PRILOSEC) 20 MG capsule Take 1 capsule (20 mg total) by mouth daily. (Patient not taking: Reported on 09/13/2020) 30  capsule 3   Vitamin D, Ergocalciferol, (DRISDOL) 1.25 MG (50000 UNIT) CAPS capsule Take 1 capsule (50,000 Units total) by mouth every 7 (seven) days. (Patient not taking: Reported on 09/13/2020) 16 capsule 0   No facility-administered medications prior to visit.    Allergies  Allergen Reactions   Ampicillin Rash    Has patient had a PCN reaction causing immediate rash, facial/tongue/throat swelling, SOB or lightheadedness with hypotension: Yes Has patient had a PCN reaction causing severe rash involving mucus membranes or skin necrosis: Unk Has patient had a PCN  reaction that required hospitalization: No Has patient had a PCN reaction occurring within the last 10 years: No If all of the above answers are "NO", then may proceed with Cephalosporin use.    Penicillins Rash    Has patient had a PCN reaction causing immediate rash, facial/tongue/throat swelling, SOB or lightheadedness with hypotension: Yes Has patient had a PCN reaction causing severe rash involving mucus membranes or skin necrosis: Unk Has patient had a PCN reaction that required hospitalization: No Has patient had a PCN reaction occurring within the last 10 years: No If all of the above answers are "NO", then may proceed with Cephalosporin use.        Objective:    BP 113/77   Pulse 73   Resp 16   Wt 158 lb 9.6 oz (71.9 kg)   SpO2 98%   BMI 26.39 kg/m  Wt Readings from Last 3 Encounters:  09/13/20 158 lb 9.6 oz (71.9 kg)  02/29/20 155 lb 8 oz (70.5 kg)  07/21/19 155 lb 12.8 oz (70.7 kg)    Physical Exam Exam conducted with a chaperone present.  Constitutional:      Appearance: She is well-developed.  HENT:     Head: Normocephalic.  Cardiovascular:     Rate and Rhythm: Normal rate and regular rhythm.     Heart sounds: Normal heart sounds.  Pulmonary:     Effort: Pulmonary effort is normal.     Breath sounds: Normal breath sounds.  Abdominal:     General: Bowel sounds are normal.     Palpations: Abdomen is soft.     Hernia: There is no hernia in the left inguinal area.  Genitourinary:    Exam position: Lithotomy position.     Labia:        Right: No rash, tenderness, lesion or injury.        Left: No rash, tenderness, lesion or injury.      Vagina: Normal. No signs of injury and foreign body. No vaginal discharge, erythema, tenderness or bleeding.     Cervix: No cervical motion tenderness or friability.     Uterus: Not deviated and not enlarged.      Adnexa:        Right: No mass, tenderness or fullness.         Left: No mass, tenderness or fullness.        Rectum: Normal. No external hemorrhoid.  Lymphadenopathy:     Lower Body: No right inguinal adenopathy. No left inguinal adenopathy.  Skin:    General: Skin is warm and dry.  Neurological:     Mental Status: She is alert and oriented to person, place, and time.  Psychiatric:        Behavior: Behavior normal.        Thought Content: Thought content normal.        Judgment: Judgment normal.         Patient has  been counseled extensively about nutrition and exercise as well as the importance of adherence with medications and regular follow-up. The patient was given clear instructions to go to ER or return to medical center if symptoms don't improve, worsen or new problems develop. The patient verbalized understanding.   Follow-up: Return if symptoms worsen or fail to improve.   Gildardo Pounds, FNP-BC [facility] [place], [place]   09/16/2020, 11:01 AM

## 2020-09-14 ENCOUNTER — Other Ambulatory Visit: Payer: Self-pay

## 2020-09-14 LAB — HIV ANTIBODY (ROUTINE TESTING W REFLEX): HIV Screen 4th Generation wRfx: NONREACTIVE

## 2020-09-14 LAB — CBC WITH DIFFERENTIAL/PLATELET
Basophils Absolute: 0 10*3/uL (ref 0.0–0.2)
Basos: 1 %
EOS (ABSOLUTE): 0.1 10*3/uL (ref 0.0–0.4)
Eos: 2 %
Hematocrit: 42.4 % (ref 34.0–46.6)
Hemoglobin: 13.8 g/dL (ref 11.1–15.9)
Immature Grans (Abs): 0 10*3/uL (ref 0.0–0.1)
Immature Granulocytes: 0 %
Lymphocytes Absolute: 2 10*3/uL (ref 0.7–3.1)
Lymphs: 30 %
MCH: 28.8 pg (ref 26.6–33.0)
MCHC: 32.5 g/dL (ref 31.5–35.7)
MCV: 88 fL (ref 79–97)
Monocytes Absolute: 0.4 10*3/uL (ref 0.1–0.9)
Monocytes: 6 %
Neutrophils Absolute: 4 10*3/uL (ref 1.4–7.0)
Neutrophils: 61 %
Platelets: 187 10*3/uL (ref 150–450)
RBC: 4.8 x10E6/uL (ref 3.77–5.28)
RDW: 13.3 % (ref 11.7–15.4)
WBC: 6.5 10*3/uL (ref 3.4–10.8)

## 2020-09-14 LAB — CMP14+EGFR
ALT: 22 IU/L (ref 0–32)
AST: 16 IU/L (ref 0–40)
Albumin/Globulin Ratio: 1.6 (ref 1.2–2.2)
Albumin: 4.4 g/dL (ref 3.8–4.8)
Alkaline Phosphatase: 131 IU/L — ABNORMAL HIGH (ref 44–121)
BUN/Creatinine Ratio: 34 — ABNORMAL HIGH (ref 9–23)
BUN: 18 mg/dL (ref 6–24)
Bilirubin Total: 0.8 mg/dL (ref 0.0–1.2)
CO2: 23 mmol/L (ref 20–29)
Calcium: 9.5 mg/dL (ref 8.7–10.2)
Chloride: 108 mmol/L — ABNORMAL HIGH (ref 96–106)
Creatinine, Ser: 0.53 mg/dL — ABNORMAL LOW (ref 0.57–1.00)
Globulin, Total: 2.8 g/dL (ref 1.5–4.5)
Glucose: 94 mg/dL (ref 65–99)
Potassium: 4.3 mmol/L (ref 3.5–5.2)
Sodium: 143 mmol/L (ref 134–144)
Total Protein: 7.2 g/dL (ref 6.0–8.5)
eGFR: 116 mL/min/{1.73_m2} (ref >59)

## 2020-09-14 LAB — CERVICOVAGINAL ANCILLARY ONLY
Bacterial Vaginitis (gardnerella): NEGATIVE
Candida Glabrata: NEGATIVE
Candida Vaginitis: NEGATIVE
Chlamydia: NEGATIVE
Comment: NEGATIVE
Comment: NEGATIVE
Comment: NEGATIVE
Comment: NEGATIVE
Comment: NEGATIVE
Comment: NORMAL
Neisseria Gonorrhea: NEGATIVE
Trichomonas: NEGATIVE

## 2020-09-14 LAB — VITAMIN D 25 HYDROXY (VIT D DEFICIENCY, FRACTURES): Vit D, 25-Hydroxy: 30.3 ng/mL (ref 30.0–100.0)

## 2020-09-16 ENCOUNTER — Encounter: Payer: Self-pay | Admitting: Nurse Practitioner

## 2020-09-21 LAB — CYTOLOGY - PAP
Comment: NEGATIVE
Diagnosis: NEGATIVE
High risk HPV: NEGATIVE

## 2020-09-28 ENCOUNTER — Telehealth (INDEPENDENT_AMBULATORY_CARE_PROVIDER_SITE_OTHER): Payer: Self-pay

## 2020-09-28 NOTE — Telephone Encounter (Signed)
I have tried to reach the patient to answer question in regards to the Estimate that she has received in the mail. Patient has been made aware that the estimate is generalized amount that could be less or more. Patient stated she has the orange card and is in the process to apply for the [facility] Discount.

## 2020-10-04 ENCOUNTER — Ambulatory Visit: Payer: Self-pay | Attending: Nurse Practitioner

## 2020-10-04 ENCOUNTER — Other Ambulatory Visit: Payer: Self-pay

## 2020-10-24 ENCOUNTER — Other Ambulatory Visit: Payer: Self-pay

## 2020-10-27 ENCOUNTER — Other Ambulatory Visit: Payer: Self-pay

## 2020-12-16 ENCOUNTER — Other Ambulatory Visit: Payer: Self-pay | Admitting: Nurse Practitioner

## 2020-12-16 DIAGNOSIS — M533 Sacrococcygeal disorders, not elsewhere classified: Secondary | ICD-10-CM

## 2020-12-16 NOTE — Telephone Encounter (Signed)
Requested medication (s) are due for refill today: yes  Requested medication (s) are on the active medication list: yes  Last refill:  yes  Future visit scheduled: no  Notes to clinic:  med not delegated to NT to RF   Requested Prescriptions  Pending Prescriptions Disp Refills   tiZANidine (ZANAFLEX) 4 MG tablet 30 tablet 1    Sig: Take 1 tablet (4 mg total) by mouth every 6 (six) hours as needed for muscle spasms.     Not Delegated - Cardiovascular:  Alpha-2 Agonists - tizanidine Failed - 12/16/2020  4:27 PM      Failed - This refill cannot be delegated      Passed - Valid encounter within last 6 months    Recent Outpatient Visits           3 months ago Encounter for Papanicolaou smear for cervical cancer screening   [facility], Vernia Buff, NP   1 year ago Stomach pain   [facility], [place], [place]   1 year ago Stomach pain   [place], [place], [place]   2 years ago Chest pressure   [facility], [place], [place]   2 years ago Hot flashes   [place], Vernia Buff, NP

## 2020-12-18 ENCOUNTER — Other Ambulatory Visit: Payer: Self-pay

## 2020-12-18 MED ORDER — TIZANIDINE HCL 4 MG PO TABS
4.0000 mg | ORAL_TABLET | Freq: Four times a day (QID) | ORAL | 0 refills | Status: DC | PRN
  Filled 2020-12-18 – 2020-12-26 (×2): qty 30, 8d supply, fill #0

## 2020-12-25 ENCOUNTER — Other Ambulatory Visit: Payer: Self-pay

## 2020-12-26 ENCOUNTER — Other Ambulatory Visit: Payer: Self-pay

## 2021-03-06 ENCOUNTER — Ambulatory Visit: Payer: Self-pay | Admitting: *Deleted

## 2021-03-06 ENCOUNTER — Ambulatory Visit (HOSPITAL_COMMUNITY)
Admission: EM | Admit: 2021-03-06 | Discharge: 2021-03-06 | Disposition: A | Discharge status: Home / Self Care | Payer: No Typology Code available for payment source | Attending: Student | Admitting: Student

## 2021-03-06 ENCOUNTER — Other Ambulatory Visit: Payer: Self-pay

## 2021-03-06 ENCOUNTER — Encounter (HOSPITAL_COMMUNITY): Payer: Self-pay | Admitting: Emergency Medicine

## 2021-03-06 DIAGNOSIS — Z789 Other specified health status: Secondary | ICD-10-CM

## 2021-03-06 DIAGNOSIS — Z0000 Encounter for general adult medical examination without abnormal findings: Secondary | ICD-10-CM

## 2021-03-06 DIAGNOSIS — K2990 Gastroduodenitis, unspecified, without bleeding: Secondary | ICD-10-CM

## 2021-03-06 DIAGNOSIS — K2970 Gastritis, unspecified, without bleeding: Secondary | ICD-10-CM

## 2021-03-06 MED ORDER — OMEPRAZOLE 20 MG PO CPDR
20.0000 mg | DELAYED_RELEASE_CAPSULE | Freq: Every day | ORAL | 0 refills | Status: DC
  Filled 2021-03-06: qty 30, 30d supply, fill #0

## 2021-03-06 NOTE — Discharge Instructions (Addendum)
-  Omeprazole one pill daily x30 days -Avoid acidic/spicy foods, coffee, alcohol, etc.  -Follow-up with PCP as soon as possible - they can hopefully order an ultrasound outpatient.  -Worsening abd pain or bulge - head to ED  -Follow-up with PCP to discuss breast concerns

## 2021-03-06 NOTE — ED Provider Notes (Signed)
[facility]    CSN: [id number] Arrival date & time: 03/06/21  1102      History   Chief Complaint Chief Complaint  Patient presents with   Abdominal Pain    HPI Danielle [name] is a 45 y.o. female presenting with epigastric pain and nausea intermittently x4 days; back pain; breast concerns. States PCP sent her here. Medical history UTI, pyelo, external hemorrhoids, back pain, cholecystis s/p cholecystectomy.  -Describes 4 days of epigastric pain with a "lump" and constant nausea without vomiting.  Was seen by primary care for similar issue few years ago, they prescribed Prilosec with resolution of symptoms.  States that the lump is new.  She is able to feel the lump when sitting up and bearing down.  States that she does struggle with occasional constipation, but her last bowel movement was about 10 minutes ago.  Has not identified triggers for the symptoms including eating, empty stomach, spicy food, movement, etc.  Has not attempted interventions at home. -Diffuse lower back pain with movement. Denies hematuria, dysuria, frequency, urgency, flank pain, n/v/d/abd pain, fevers/chills, abdnormal vaginal discharge.  -States she is concerned her breasts are "more pointy" than normal, she typically gets yearly mammogram but has not had this yet.  Denies other symptoms like redness, swelling, discharge, pain, fever/chills.  He is unsure how to do a self breast exam.   HPI  Past Medical History:  Diagnosis Date   History of blood transfusion 2007   "related to OR"    Patient Active Problem List   Diagnosis Date Noted   Hyperbilirubinemia 04/11/2017   Cholecystitis 04/07/2017   Gastritis and gastroduodenitis 02/20/2016   Back pain 09/21/2012   UTI 09/27/2009   BACK PAIN 09/27/2009   URINALYSIS, ABNORMAL 09/27/2009   VERTIGO 02/24/2009   SORE THROAT 10/07/2008   EXTERNAL HEMORRHOIDS 07/11/2008   RECTAL FISSURE 07/11/2008   DEHYDRATION 06/29/2008   PYELONEPHRITIS  06/29/2008   HEADACHE 06/29/2008    Past Surgical History:  Procedure Laterality Date   ABDOMINAL HYSTERECTOMY  2007   CESAREAN SECTION  2007   CHOLECYSTECTOMY N/A 04/07/2017   Procedure: LAPAROSCOPIC CHOLECYSTECTOMY;  Surgeon: Mickeal Skinner, MD;  Location: [facility];  Service: General;  Laterality: N/A;   LAPAROSCOPIC CHOLECYSTECTOMY  04/07/2017    OB History     Gravida  4   Para  3   Term  3   Preterm      AB  1   Living  4      SAB  1   IAB      Ectopic      Multiple      Live Births  3            Home Medications    Prior to Admission medications   Medication Sig Start Date End Date Taking? Authorizing Provider  omeprazole (PRILOSEC) 20 MG capsule Take 1 capsule (20 mg total) by mouth daily. 03/06/21  Yes Hazel Sams, PA-C  acetaminophen-codeine (TYLENOL #3) 300-30 MG tablet Take 1 tablet by mouth every 8 (eight) hours as needed for moderate pain. 09/13/20   Gildardo Pounds, NP    Family History Family History  Problem Relation Age of Onset   Thyroid disease Mother     Social History Social History   Tobacco Use   Smoking status: Never   Smokeless tobacco: Never  Vaping Use   Vaping Use: Never used  Substance Use Topics   Alcohol use: No   Drug  use: No     Allergies   Ampicillin and Penicillins   Review of Systems Review of Systems  Constitutional:  Negative for appetite change, chills, diaphoresis, fever and unexpected weight change.  HENT:  Negative for congestion, ear pain, sinus pressure, sinus pain, sneezing, sore throat and trouble swallowing.   Respiratory:  Negative for cough, chest tightness and shortness of breath.   Cardiovascular:  Negative for chest pain.  Gastrointestinal:  Positive for abdominal pain and nausea. Negative for abdominal distention, anal bleeding, blood in stool, constipation, diarrhea, rectal pain and vomiting.  Genitourinary:  Negative for dysuria, flank pain, frequency and urgency.   Musculoskeletal:  Negative for back pain and myalgias.  Neurological:  Negative for dizziness, light-headedness and headaches.  All other systems reviewed and are negative.   Physical Exam Triage Vital Signs ED Triage Vitals [03/06/21 1156]  Enc Vitals Group     BP 120/82     Pulse Rate 80     Resp 16     Temp 98.2 F (36.8 C)     Temp Source Oral     SpO2 98 %     Weight      Height      Head Circumference      Peak Flow      Pain Score      Pain Loc      Pain Edu?      Excl. in [place]?    No data found.  Updated Vital Signs BP 120/82 (BP Location: Right Arm)    Pulse 80    Temp 98.2 F (36.8 C) (Oral)    Resp 16    SpO2 98%   Visual Acuity Right Eye Distance:   Left Eye Distance:   Bilateral Distance:    Right Eye Near:   Left Eye Near:    Bilateral Near:     Physical Exam Vitals reviewed.  Constitutional:      General: She is not in acute distress.    Appearance: Normal appearance. She is not ill-appearing.  HENT:     Head: Normocephalic and atraumatic.     Mouth/Throat:     Mouth: Mucous membranes are moist.     Comments: Moist mucous membranes Eyes:     Extraocular Movements: Extraocular movements intact.     Pupils: Pupils are equal, round, and reactive to light.  Cardiovascular:     Rate and Rhythm: Normal rate and regular rhythm.     Heart sounds: Normal heart sounds.  Pulmonary:     Effort: Pulmonary effort is normal.     Breath sounds: Normal breath sounds. No wheezing, rhonchi or rales.  Chest:     Chest wall: No tenderness.  Breasts:    Right: Normal. No swelling, bleeding, inverted nipple, mass, nipple discharge, skin change or tenderness.     Left: Normal. No swelling, bleeding, inverted nipple, mass, nipple discharge, skin change or tenderness.     Comments: Chaperone: family member  Abdominal:     General: Bowel sounds are normal. There is no distension.     Palpations: Abdomen is soft. There is no mass.     Tenderness: There is  abdominal tenderness in the epigastric area. There is no right CVA tenderness, left CVA tenderness, guarding or rebound.     Comments: Comfortable throughout exam, without guarding, rebound, distension. No mass or hernia palpated.   Musculoskeletal:     Comments: Bilateral lumbar paraspinous muscle tenderness to palpation, pain elicited with flexion and extension  lumbar spine.  No midline spinous tenderness or deformity.  Skin:    General: Skin is warm.     Capillary Refill: Capillary refill takes less than 2 seconds.     Comments: Good skin turgor  Neurological:     General: No focal deficit present.     Mental Status: She is alert and oriented to person, place, and time.  Psychiatric:        Mood and Affect: Mood normal.        Behavior: Behavior normal.     UC Treatments / Results  Labs (all labs ordered are listed, but only abnormal results are displayed) Labs Reviewed - No data to display  EKG   Radiology No results found.  Procedures Procedures (including critical care time)  Medications Ordered in UC Medications - No data to display  Initial Impression / Assessment and Plan / UC Course  I have reviewed the triage vital signs and the nursing notes.  Pertinent labs & imaging results that were available during my care of the patient were reviewed by me and considered in my medical decision making (see chart for details).     This patient is a very pleasant 45 y.o. year old female presenting with epigastric pain/ gastritis; lumbar strain; breast concerns. Ddx is gastritis vs hiatal hernia vs other. Reassuring exam today so will try prilosec x30 days, and f/u with PCP at earliest convenience for possible [place]. Breast exam reassuring; she is overdue for mammogram, f/u with PCP. For back pain- this is MSK in nature, no urinary symptoms. ED return precautions discussed. Patient verbalizes understanding and agreement.   Spoke with patient using translation provided by family  member at patient request.  Final Clinical Impressions(s) / UC Diagnoses   Final diagnoses:  Gastritis and gastroduodenitis  Normal breast exam  Language barrier     Discharge Instructions      -Omeprazole one pill daily x30 days -Avoid acidic/spicy foods, coffee, alcohol, etc.  -Follow-up with PCP as soon as possible - they can hopefully order an ultrasound outpatient.  -Worsening abd pain or bulge - head to ED  -Follow-up with PCP to discuss breast concerns     ED Prescriptions     Medication Sig Dispense Auth. Provider   omeprazole (PRILOSEC) 20 MG capsule Take 1 capsule (20 mg total) by mouth daily. 30 capsule Hazel Sams, PA-C      PDMP not reviewed this encounter.   Hazel Sams, PA-C 03/06/21 1235

## 2021-03-06 NOTE — Telephone Encounter (Signed)
Reason for Disposition  [1] MODERATE pain (e.g., interferes with normal activities) AND [2] pain comes and goes (cramps) AND [3] present > 24 hours  (Exception: pain with Vomiting or Diarrhea - see that Guideline)  Answer Assessment - Initial Assessment Questions 1. LOCATION: "Where does it hurt?"      Pt calling in with pacific interpreter Beola Cord [phone]. She's had pain for 3 days in middle of stomach in the center.   2. RADIATION: "Does the pain shoot anywhere else?" (e.g., chest, back)     On top of waist area in the back. 3. ONSET: "When did the pain begin?" (e.g., minutes, hours or days ago)      3 days 4. SUDDEN: "Gradual or sudden onset?"     *No Answer* 5. PATTERN "Does the pain come and go, or is it constant?"    - If constant: "Is it getting better, staying the same, or worsening?"      (Note: Constant means the pain never goes away completely; most serious pain is constant and it progresses)     - If intermittent: "How long does it last?" "Do you have pain now?"     (Note: Intermittent means the pain goes away completely between bouts)     *No Answer* 6. SEVERITY: "How bad is the pain?"  (e.g., Scale 1-10; mild, moderate, or severe)   - MILD (1-3): doesn't interfere with normal activities, abdomen soft and not tender to touch    - MODERATE (4-7): interferes with normal activities or awakens from sleep, abdomen tender to touch    - SEVERE (8-10): excruciating pain, doubled over, unable to do any normal activities      5/10 7. RECURRENT SYMPTOM: "Have you ever had this type of stomach pain before?" If Yes, ask: "When was the last time?" and "What happened that time?"      No  8. CAUSE: "What do you think is causing the stomach pain?"     *No Answer* 9. RELIEVING/AGGRAVATING FACTORS: "What makes it better or worse?" (e.g., movement, antacids, bowel movement)     *No Answer* 10. OTHER SYMPTOMS: "Do you have any other symptoms?" (e.g., back pain, diarrhea, fever, urination pain,  vomiting)       I'm not [place] and I feel like I could throw up.   I feel this way all the time.  Pain under my breasts on my ribs in the middle with the abd pain.  It's like the middle of my chest and I felt there and felt a lump.    11. PREGNANCY: "Is there any chance you are pregnant?" "When was your last menstrual period?"       No I can't have babies any more.  Protocols used: Abdominal Pain [facility]  Chief Complaint: abd pain that radiates to back at the waist level and under both of her breasts under her ribs.  Has nausea Symptoms: See above Frequency: All the time for 3 days Pertinent Negatives: Patient denies diarrhea, vomiting. Disposition: [] ED /[x] Urgent Care (no appt availability in office) / [] Appointment(In office/virtual)/ []   Virtual Care/ [] Home Care/ [] Refused Recommended Disposition  Additional Notes: There were no appts available so I have referred her to the urgent care which she is agreeable to going.   I also made her an appt with Geryl Rankins for 05/04/2020 at 9:50 at her request.

## 2021-03-06 NOTE — ED Triage Notes (Signed)
Pt had constant mid upper abd pains x 4 days with intermittent nausea.

## 2021-03-07 ENCOUNTER — Other Ambulatory Visit: Payer: Self-pay | Admitting: Nurse Practitioner

## 2021-03-07 ENCOUNTER — Telehealth: Payer: Self-pay | Admitting: Nurse Practitioner

## 2021-03-07 DIAGNOSIS — Z1231 Encounter for screening mammogram for malignant neoplasm of breast: Secondary | ICD-10-CM

## 2021-03-07 NOTE — Telephone Encounter (Signed)
Called and gave the number to the pt

## 2021-03-07 NOTE — Telephone Encounter (Signed)
Needs to call breast clinic for mammogram scholarship. Uninsured. [phone]. Order is placed for mammogram

## 2021-03-07 NOTE — Telephone Encounter (Signed)
Pt was seen in the ED yesterday for a lump under breast and they advised pt to contact pcp to get an order for an ultrasound / pt was advised when she called previously that pcp office had no appts until feb/ please advise asap

## 2021-03-08 ENCOUNTER — Ambulatory Visit: Payer: No Typology Code available for payment source | Admitting: Physician Assistant

## 2021-03-08 ENCOUNTER — Other Ambulatory Visit: Payer: Self-pay

## 2021-03-08 ENCOUNTER — Ambulatory Visit: Payer: Self-pay | Attending: Physician Assistant | Admitting: Physician Assistant

## 2021-03-08 ENCOUNTER — Encounter: Payer: Self-pay | Admitting: Physician Assistant

## 2021-03-08 VITALS — BP 111/85 | HR 70 | Ht 65.0 in | Wt 155.1 lb

## 2021-03-08 DIAGNOSIS — Z789 Other specified health status: Secondary | ICD-10-CM

## 2021-03-08 DIAGNOSIS — R198 Other specified symptoms and signs involving the digestive system and abdomen: Secondary | ICD-10-CM

## 2021-03-08 DIAGNOSIS — R1013 Epigastric pain: Secondary | ICD-10-CM

## 2021-03-08 DIAGNOSIS — Z09 Encounter for follow-up examination after completed treatment for conditions other than malignant neoplasm: Secondary | ICD-10-CM

## 2021-03-08 DIAGNOSIS — K5900 Constipation, unspecified: Secondary | ICD-10-CM

## 2021-03-08 DIAGNOSIS — R110 Nausea: Secondary | ICD-10-CM

## 2021-03-08 LAB — POCT URINALYSIS DIP (CLINITEK)
Bilirubin, UA: NEGATIVE
Glucose, UA: NEGATIVE mg/dL
Ketones, POC UA: NEGATIVE mg/dL
Nitrite, UA: NEGATIVE
POC PROTEIN,UA: NEGATIVE
Spec Grav, UA: >=1.03 — AB (ref 1.010–1.025)
Urobilinogen, UA: 0.2 E.U./dL
pH, UA: 5.5 (ref 5.0–8.0)

## 2021-03-08 MED ORDER — POLYETHYLENE GLYCOL 3350 17 GM/SCOOP PO POWD
17.0000 g | Freq: Two times a day (BID) | ORAL | 1 refills | Status: DC | PRN
  Filled 2021-03-08: qty 1020, 30d supply, fill #0

## 2021-03-08 MED ORDER — OMEPRAZOLE 20 MG PO CPDR
20.0000 mg | DELAYED_RELEASE_CAPSULE | Freq: Every day | ORAL | 1 refills | Status: DC
  Filled 2021-03-08: qty 90, 90d supply, fill #0

## 2021-03-08 NOTE — Progress Notes (Signed)
Patient ID: Danielle [name], female   DOB: Jun 19, 1975, 45 y.o.   MRN: [id number]    Danielle [name], is a 45 y.o. female  QIH:[id number]  OVF:[id number]  DOB - 07/29/1975  Chief Complaint  Patient presents with   Abdominal Pain       Subjective:   Danielle [name] is a 45 y.o. female here today for a follow up visit  Mild nausea; no vomiting. No diarrhea.  Mild constipation.  Seen at [facility] 03/06/2021.  This has been going on for 4-5 days.  No urinary s/sx.  No periods-s/p hyst.  No fever.  No vaginal d/c/pelvic pain.  No melena or hematochezia.  Pain is mild and intermittent.  She also c/o fullness in RUQ that she describes as a small lump.  UC recommended she get an U/S of this.  She is s/p cholecystectomy years ago.    No period in many years  After ED visit 03/06/2021 for stomach upset.    From ED note: HPI Danielle [name] is a 45 y.o. female presenting with epigastric pain and nausea intermittently x4 days; back pain; breast concerns. States PCP sent her here. Medical history UTI, pyelo, external hemorrhoids, back pain, cholecystis s/p cholecystectomy.  -Describes 4 days of epigastric pain with a "lump" and constant nausea without vomiting.  Was seen by primary care for similar issue few years ago, they prescribed Prilosec with resolution of symptoms.  States that the lump is new.  She is able to feel the lump when sitting up and bearing down.  States that she does struggle with occasional constipation, but her last bowel movement was about 10 minutes ago.  Has not identified triggers for the symptoms including eating, empty stomach, spicy food, movement, etc.  Has not attempted interventions at home. -Diffuse lower back pain with movement. Denies hematuria, dysuria, frequency, urgency, flank pain, n/v/d/abd pain, fevers/chills, abdnormal vaginal discharge.  -States she is concerned her breasts are "more pointy" than normal, she typically gets yearly mammogram but has not had this yet.  Denies  other symptoms like redness, swelling, discharge, pain, fever/chills.  He is unsure how to do a self breast exam.  A/P: This patient is a very pleasant 45 y.o. year old female presenting with epigastric pain/ gastritis; lumbar strain; breast concerns. Ddx is gastritis vs hiatal hernia vs other. Reassuring exam today so will try prilosec x30 days, and f/u with PCP at earliest convenience for possible [place]. Breast exam reassuring; she is overdue for mammogram, f/u with PCP. For back pain- this is MSK in nature, no urinary symptoms. ED return precautions discussed. Patient verbalizes understanding and agreement. Patient has No headache, No chest pain, No abdominal pain - No Nausea, No new weakness tingling or numbness, No Cough - SOB.  No problems updated.  ALLERGIES: Allergies  Allergen Reactions   Ampicillin Rash    Has patient had a PCN reaction causing immediate rash, facial/tongue/throat swelling, SOB or lightheadedness with hypotension: Yes Has patient had a PCN reaction causing severe rash involving mucus membranes or skin necrosis: Unk Has patient had a PCN reaction that required hospitalization: No Has patient had a PCN reaction occurring within the last 10 years: No If all of the above answers are "NO", then may proceed with Cephalosporin use.    Penicillins Rash    Has patient had a PCN reaction causing immediate rash, facial/tongue/throat swelling, SOB or lightheadedness with hypotension: Yes Has patient had a PCN reaction causing severe rash involving mucus membranes or skin necrosis: Unk Has  patient had a PCN reaction that required hospitalization: No Has patient had a PCN reaction occurring within the last 10 years: No If all of the above answers are "NO", then may proceed with Cephalosporin use.     PAST MEDICAL HISTORY: Past Medical History:  Diagnosis Date   History of blood transfusion 2007   "related to OR"    MEDICATIONS AT HOME: Prior to Admission medications    Medication Sig Start Date End Date Taking? Authorizing Provider  polyethylene glycol powder (GLYCOLAX/MIRALAX) 17 GM/SCOOP powder Take 17 g by mouth 2 (two) times daily as needed. For constipation 03/08/21  Yes Danielle Masoner M, PA-C  acetaminophen-codeine (TYLENOL #3) 300-30 MG tablet Take 1 tablet by mouth every 8 (eight) hours as needed for moderate pain. Patient not taking: Reported on 03/08/2021 09/13/20   Danielle Pounds, NP  omeprazole (PRILOSEC) 20 MG capsule Take 1 capsule (20 mg total) by mouth daily. 03/08/21   Danielle [name], Dionne Bucy, PA-C    ROS: Neg HEENT Neg resp Neg cardiac Neg MS Neg psych Neg neuro  Objective:   Vitals:   03/08/21 0903  BP: 111/85  Pulse: 70  SpO2: 97%  Weight: 155 lb 2 oz (70.4 kg)  Height: 5\' 5"  (1.651 [name])   Exam General appearance : Awake, alert, not in any distress. Speech Clear. Not toxic looking HEENT: Atraumatic and Normocephalic Neck: Supple, no JVD. No cervical lymphadenopathy.  Chest: Good air entry bilaterally, CTAB.  No rales/rhonchi/wheezing CVS: S1 S2 regular, no murmurs.  Abdomen: Bowel sounds present, Non tender and not distended with no gaurding, rigidity or rebound.  No lump/mass/organomegaly appreciated Extremities: B/L Lower Ext shows no edema, both legs are warm to touch Neurology: Awake alert, and oriented X 3, CN II-XII intact, Non focal Skin: No Rash  Data Review Lab Results  Component Value Date   HGBA1C 5.6 05/18/2014   HGBA1C 5.4 10/12/2012    Assessment & Plan   1. RUQ fullness No red flags-non-acute abdomen.  ?fatty liver - Comprehensive metabolic panel - CBC with Differential/Platelet - POCT URINALYSIS DIP (CLINITEK) - US Abdomen Limited RUQ (LIVER/GB); Future  2. Dyspepsia - Comprehensive metabolic panel - H. pylori breath test - CBC with Differential/Platelet - POCT URINALYSIS DIP (CLINITEK) - omeprazole (PRILOSEC) 20 MG capsule; Take 1 capsule (20 mg total) by mouth daily.  Dispense: 90 capsule;  Refill: 1 - US Abdomen Limited RUQ (LIVER/GB); Future  3. Nausea - POCT URINALYSIS DIP (CLINITEK) - omeprazole (PRILOSEC) 20 MG capsule; Take 1 capsule (20 mg total) by mouth daily.  Dispense: 90 capsule; Refill: 1 - US Abdomen Limited RUQ (LIVER/GB); Future  4. Constipation, unspecified constipation type - US Abdomen Limited RUQ (LIVER/GB); Future - polyethylene glycol powder (GLYCOLAX/MIRALAX) 17 GM/SCOOP powder; Take 17 g by mouth 2 (two) times daily as needed. For constipation  Dispense: 3350 g; Refill: 1  5. Language barrier [employer] "[employer]" interpreters used and additional time performing visit was required.   6. Encounter for examination following treatment at hospital Increase water intake    Patient have been counseled extensively about nutrition and exercise. Other issues discussed during this visit include: low cholesterol diet, weight control and daily exercise, foot care, annual eye examinations at Ophthalmology, importance of adherence with medications and regular follow-up. We also discussed long term complications of uncontrolled diabetes and hypertension.   Return in about 6 months (around 09/06/2021), or if symptoms worsen or fail to improve, for PCP.  The patient was given clear instructions to go to  ER or return to medical center if symptoms don't improve, worsen or new problems develop. The patient verbalized understanding. The patient was told to call to get lab results if they haven't heard anything in the next week.      Freeman Caldron, PA-C [facility] [place], [place]   03/08/2021, 9:19 AM

## 2021-03-08 NOTE — Patient Instructions (Signed)
Drink 80-100 ounces water daily   Indigestin Indigestion La indigestin es una sensacin de [place], [place], ardor o saciedad en la parte superior del vientre (abdomen). Puede irse y volver. Puede suceder a menudo o en raras ocasiones. La indigestin suele suceder mientras est comiendo o inmediatamente despus de terminar de comer. Puede ser peor: Por la noche. Al inclinarse hacia abajo. Al estar acostado. La indigestin puede ser un sntoma de otra afeccin. Siga estas instrucciones en su casa: Comida y bebida  Siga el plan de alimentacin que le d el mdico. Es posible que deba evitar alimentos y bebidas, por ejemplo: Chocolate y cacao. Menta y [place]. Ajo y cebolla. Rbano picante. Alimentos cidos y condimentados, tales como: Pimientos. [place] en polvo y curry en polvo. Vinagre. Salsas picantes y [employer] Las frutas ctricas, tales como: Naranjas. Limones. Limas. Alimentos a base de tomate, tales como: Salsa roja y pizza con salsa roja. [place]. Salsa. Alimentos fritos y Research officer, political party, tales como: [place]. Papas fritas y papas fritas de bolsa. Aderezos con alto contenido de Lobbyist. Carnes con alto contenido de [place], tales como: Perros calientes y Engineer, petroleum. Chuletas. Jamn y tocino. Productos lcteos con alto contenido de [place], tales como: Leche entera. [place]. Queso crema. Caf y t negro, con o sin cafena. Bebidas que contengan alcohol. Bebidas energticas y deportivas. Bebidas gaseosas o refrescos. Jugos de frutas ctricas. Consuma pequeas cantidades de comida con ms frecuencia. Evite consumir porciones abundantes. Evite beber grandes cantidades de lquidos con las comidas. Evite comer 2 o 3 horas antes de acostarse. Evite recostarse inmediatamente despus de comer. Evite hacer ejercicio durante 2 horas despus de comer. Estilo de vida   Mantenga un peso saludable. Pregntele a su mdico cul es el peso saludable para usted. Si  necesita adelgazar, consulte a su mdico. Haga actividad fsica durante, al menos, 30 minutos 5 das por semana o ms, o como se lo haya indicado el mdico. Evite los ejercicios que impliquen inclinarse hacia delante. Estos pueden empeorar los sntomas. Use ropa holgada. No use nada apretado alrededor de la cintura. No fume ni consuma ningn producto que contenga nicotina o tabaco. Estos pueden empeorar los sntomas. Si necesita ayuda para dejar de fumar, consulte al mdico. Levante (eleve) la cabecera de la cama aproximadamente 6 pulgadas (15 cm) para dormir. Para hacerlo puede usar una cua. Intente reducir [employer] de estrs. Si necesita ayuda para hacer esto, consulte al mdico. Instrucciones generales Use los medicamentos de venta libre y los recetados solamente como se lo haya indicado el mdico. No tome aspirina ni antiinflamatorios no esteroideos (AINE), como el ibuprofeno, a menos que el mdico lo autorice. Controle si hay algn cambio en sus sntomas. Cumpla con todas las visitas de seguimiento. Comunquese con un mdico si: Aparecen nuevos sntomas. Adelgaza y no sabe por qu. Tiene problemas para tragar, o le duele cuando traga. Los sntomas no mejoran con Dispensing optician. Sus sntomas duran ms de 2 das. Tiene fiebre. Vomita. Solicite ayuda de inmediato si: Tree surgeon repentino [employer], el cuello, la [place], los dientes o la espalda. De repente se siente transpirado, mareado o aturdido. Se desmaya. Siente falta de aire o Tourist information centre manager. No puede dejar de vomitar o vomita sangre. Las deposiciones (heces) son sanguinolentas o negras. Tiene dolor muy intenso en el abdomen. Estos sntomas pueden Sales executive. Solicite ayuda de inmediato. Comunquese con el servicio de emergencias de su localidad (911 en los [place]). No espere a ver si los sntomas  desaparecen. No conduzca por sus propios medios [employer]. Resumen La indigestin es una  sensacin de dolor, [place], ardor o saciedad en la parte superior del vientre. Suele suceder mientras est comiendo o inmediatamente despus de terminar de comer. Siga el plan de alimentacin y otros cambios en el estilo de vida que le haya indicado el mdico. Use los medicamentos de venta libre y los recetados solamente como se lo haya indicado el mdico. No tome aspirina ni antiinflamatorios no esteroideos (AINE), como el ibuprofeno, a menos que el mdico lo autorice. Comunquese con el mdico si los sntomas no mejoran o si empeoran. Algunos sntomas pueden representar un problema grave que constituye Engineer, maintenance (IT). No espere a ver si los sntomas desaparecen. Solicite atencin mdica de inmediato. Esta informacin no tiene Marine scientist el consejo del mdico. Asegrese de hacerle al mdico cualquier pregunta que tenga. Document Revised: 10/13/2019 Document Reviewed: 10/13/2019 Elsevier Patient Education  2022 [employer].

## 2021-03-09 LAB — COMPREHENSIVE METABOLIC PANEL
ALT: 23 IU/L (ref 0–32)
AST: 17 IU/L (ref 0–40)
Albumin/Globulin Ratio: 2.1 (ref 1.2–2.2)
Albumin: 4.6 g/dL (ref 3.8–4.8)
Alkaline Phosphatase: 113 IU/L (ref 44–121)
BUN/Creatinine Ratio: 18 (ref 9–23)
BUN: 13 mg/dL (ref 6–24)
Bilirubin Total: 0.5 mg/dL (ref 0.0–1.2)
CO2: 24 mmol/L (ref 20–29)
Calcium: 9.4 mg/dL (ref 8.7–10.2)
Chloride: 106 mmol/L (ref 96–106)
Creatinine, Ser: 0.71 mg/dL (ref 0.57–1.00)
Globulin, Total: 2.2 g/dL (ref 1.5–4.5)
Glucose: 100 mg/dL — ABNORMAL HIGH (ref 70–99)
Potassium: 4.5 mmol/L (ref 3.5–5.2)
Sodium: 143 mmol/L (ref 134–144)
Total Protein: 6.8 g/dL (ref 6.0–8.5)
eGFR: 107 mL/min/{1.73_m2} (ref >59)

## 2021-03-09 LAB — CBC WITH DIFFERENTIAL/PLATELET
Basophils Absolute: 0 10*3/uL (ref 0.0–0.2)
Basos: 1 %
EOS (ABSOLUTE): 0.2 10*3/uL (ref 0.0–0.4)
Eos: 3 %
Hematocrit: 41.8 % (ref 34.0–46.6)
Hemoglobin: 13.9 g/dL (ref 11.1–15.9)
Immature Grans (Abs): 0 10*3/uL (ref 0.0–0.1)
Immature Granulocytes: 0 %
Lymphocytes Absolute: 2.2 10*3/uL (ref 0.7–3.1)
Lymphs: 38 %
MCH: 28.9 pg (ref 26.6–33.0)
MCHC: 33.3 g/dL (ref 31.5–35.7)
MCV: 87 fL (ref 79–97)
Monocytes Absolute: 0.3 10*3/uL (ref 0.1–0.9)
Monocytes: 6 %
Neutrophils Absolute: 3.1 10*3/uL (ref 1.4–7.0)
Neutrophils: 52 %
Platelets: 194 10*3/uL (ref 150–450)
RBC: 4.81 x10E6/uL (ref 3.77–5.28)
RDW: 13.3 % (ref 11.7–15.4)
WBC: 5.8 10*3/uL (ref 3.4–10.8)

## 2021-03-09 LAB — H. PYLORI BREATH TEST: H pylori Breath Test: NEGATIVE

## 2021-03-12 ENCOUNTER — Other Ambulatory Visit: Payer: Self-pay

## 2021-03-14 ENCOUNTER — Ambulatory Visit (HOSPITAL_COMMUNITY)
Admission: RE | Admit: 2021-03-14 | Discharge: 2021-03-14 | Disposition: A | Discharge status: Home / Self Care | Payer: No Typology Code available for payment source | Source: Ambulatory Visit | Attending: Physician Assistant | Admitting: Physician Assistant

## 2021-03-14 ENCOUNTER — Other Ambulatory Visit: Payer: Self-pay

## 2021-03-14 ENCOUNTER — Encounter: Payer: Self-pay | Admitting: *Deleted

## 2021-03-14 DIAGNOSIS — R198 Other specified symptoms and signs involving the digestive system and abdomen: Secondary | ICD-10-CM

## 2021-03-14 DIAGNOSIS — R1013 Epigastric pain: Secondary | ICD-10-CM

## 2021-03-14 DIAGNOSIS — R110 Nausea: Secondary | ICD-10-CM

## 2021-03-14 DIAGNOSIS — K5900 Constipation, unspecified: Secondary | ICD-10-CM

## 2021-03-14 DIAGNOSIS — Z1231 Encounter for screening mammogram for malignant neoplasm of breast: Secondary | ICD-10-CM

## 2021-03-16 ENCOUNTER — Other Ambulatory Visit: Payer: Self-pay

## 2021-03-16 ENCOUNTER — Ambulatory Visit (HOSPITAL_COMMUNITY)
Admission: RE | Admit: 2021-03-16 | Discharge: 2021-03-16 | Disposition: A | Discharge status: Home / Self Care | Payer: Self-pay | Source: Ambulatory Visit | Attending: Physician Assistant | Admitting: Physician Assistant

## 2021-03-16 DIAGNOSIS — R1013 Epigastric pain: Secondary | ICD-10-CM | POA: Insufficient documentation

## 2021-03-16 DIAGNOSIS — K5900 Constipation, unspecified: Secondary | ICD-10-CM | POA: Insufficient documentation

## 2021-03-16 DIAGNOSIS — R110 Nausea: Secondary | ICD-10-CM | POA: Insufficient documentation

## 2021-03-16 DIAGNOSIS — R198 Other specified symptoms and signs involving the digestive system and abdomen: Secondary | ICD-10-CM | POA: Insufficient documentation

## 2021-03-26 ENCOUNTER — Telehealth: Payer: Self-pay

## 2021-03-26 NOTE — Telephone Encounter (Signed)
Called patient reviewed all information and repeated back to me. Will call if any questions.  With the help or interpretor Fiorella # [phone]

## 2021-04-02 ENCOUNTER — Other Ambulatory Visit: Payer: Self-pay

## 2021-04-02 DIAGNOSIS — N6310 Unspecified lump in the right breast, unspecified quadrant: Secondary | ICD-10-CM

## 2021-04-10 ENCOUNTER — Other Ambulatory Visit: Payer: Self-pay

## 2021-04-10 ENCOUNTER — Ambulatory Visit: Payer: Self-pay | Admitting: *Deleted

## 2021-04-10 VITALS — BP 110/76 | Wt 158.8 lb

## 2021-04-10 DIAGNOSIS — Z1211 Encounter for screening for malignant neoplasm of colon: Secondary | ICD-10-CM

## 2021-04-10 DIAGNOSIS — N6311 Unspecified lump in the right breast, upper outer quadrant: Secondary | ICD-10-CM

## 2021-04-10 DIAGNOSIS — Z1239 Encounter for other screening for malignant neoplasm of breast: Secondary | ICD-10-CM

## 2021-04-10 NOTE — Progress Notes (Signed)
Danielle [name] is a 46 y.o. female who presents to [facility] clinic today with complaint of two right breast lumps x 3 weeks-one month. Patient states the lumps have increased in size since she first noticed. Patient denied pain or discharge..    Pap Smear: Pap smear not completed today. Last Pap smear was 09/13/2020 at [facility] and normal with negative HPV. Per patient has no history of an abnormal Pap smear. 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 Last three Pap smear results are available in Epic.   Physical exam: Breasts Breasts symmetrical. No skin abnormalities bilateral breasts. No nipple retraction bilateral breasts. No nipple discharge bilateral breasts. No lymphadenopathy. No lumps palpated left breast. Palpated a pea sized lump within the right breast at 10 o'clock next to areola. No complaints of pain or tenderness on exam.  MS DIGITAL SCREENING TOMO BILATERAL  Result Date: 03/02/2020 CLINICAL DATA:  Screening. EXAM: DIGITAL SCREENING BILATERAL MAMMOGRAM WITH TOMO AND CAD COMPARISON:  Previous exam(s). ACR Breast Density Category b: There are scattered areas of fibroglandular density. FINDINGS: There are no findings suspicious for malignancy. Images were processed with CAD. IMPRESSION: No mammographic evidence of malignancy. A result letter of this screening mammogram will be mailed directly to the patient. RECOMMENDATION: Screening mammogram in one year. (Code:SM-B-01Y) BI-RADS CATEGORY  1: Negative. Electronically Signed   By: Margarette Canada M.D.   On: 03/02/2020 13:55   MS DIGITAL SCREENING TOMO BILATERAL  Result Date: 09/03/2018 CLINICAL DATA:  Screening. EXAM: DIGITAL SCREENING BILATERAL MAMMOGRAM WITH TOMO AND CAD COMPARISON:  Previous exam(s). ACR Breast Density Category b: There are scattered areas of fibroglandular density. FINDINGS: There are no findings suspicious  for malignancy. Images were processed with CAD. IMPRESSION: No mammographic evidence of malignancy. A result letter of this screening mammogram will be mailed directly to the patient. RECOMMENDATION: Screening mammogram in one year. (Code:SM-B-01Y) BI-RADS CATEGORY  1: Negative. Electronically Signed   By: Fidela Salisbury M.D.   On: 09/03/2018 11:08    Pelvic/Bimanual Pap is not indicated today per BCCCP guidelines.    Smoking History: Patient is a former smoker that quit 20 years ago.   Patient Navigation: Patient education provided. Access to services provided for patient through [facility] program. Spanish interpreter Rudene Anda from [facility] provided.   Colorectal Cancer Screening: Per patient has never had colonoscopy completed. FIT Test given to patient to complete. No complaints today.    Breast and Cervical Cancer Risk Assessment: Patient does not have family history of breast cancer, known genetic mutations, or radiation treatment to the chest before age 51. Patient does not have history of cervical dysplasia, immunocompromised, or DES exposure in-utero.  Risk Assessment     Risk Scores       04/10/2021 02/29/2020   Last edited by: Demetrius Revel, LPN McGill, Sherie Mamie Nick, LPN   5-year risk: 0.5 % 0.5 %   Lifetime risk: 6.1 % 6.2 %            A: BCCCP exam without pap smear Complaint of two right breast lumps.  P: Referred patient to the [facility] for a diagnostic mammogram. Appointment scheduled Tuesday, April 24, 2021 at 1240.  Loletta Parish, RN 04/10/2021 10:46 AM

## 2021-04-10 NOTE — Patient Instructions (Signed)
Explained breast self awareness with [name]. Patient did not need a Pap smear and HPV typing was 09/13/2020. Let her know BCCCP will cover Pap smears and HPV Typing every 5 years unless has a history of abnormal Pap smears. Referred patient to the [facility] for a diagnostic mammogram. Appointment scheduled Tuesday, April 24, 2021 at 1240. Patient aware of appointment and will be there. [name] verbalized understanding.  Zyree Traynham, Arvil Chaco, RN 10:46 AM

## 2021-04-18 ENCOUNTER — Other Ambulatory Visit: Payer: Self-pay

## 2021-04-18 ENCOUNTER — Other Ambulatory Visit: Payer: Self-pay | Admitting: Nurse Practitioner

## 2021-04-18 DIAGNOSIS — M533 Sacrococcygeal disorders, not elsewhere classified: Secondary | ICD-10-CM

## 2021-04-18 NOTE — Telephone Encounter (Signed)
Requested medication (s) are due for refill today: not on current med list  Requested medication (s) are on the active medication list: no  Last refill:  12/18/20 #30 with 0 RF  Future visit scheduled: 05/04/21  Notes to clinic:  This medication can not be delegated, please assess.   Requested Prescriptions  Pending Prescriptions Disp Refills   tiZANidine (ZANAFLEX) 4 MG tablet 30 tablet 0    Sig: Take 1 tablet (4 mg total) by mouth every 6 (six) hours as needed for muscle spasms.     Not Delegated - Cardiovascular:  Alpha-2 Agonists - tizanidine Failed - 04/18/2021  3:13 PM      Failed - This refill cannot be delegated      Passed - Valid encounter within last 6 months    Recent Outpatient Visits           1 month ago RUQ fullness   [facility] [place], [place], [place]   7 months ago Encounter for Papanicolaou smear for cervical cancer screening   [facility] [place], Vernia Buff, NP   1 year ago Stomach pain   [place], [place], [place]   2 years ago Stomach pain   [place], [place], [place]   2 years ago Chest pressure   [place], Dionne [place], [place]       Future Appointments             In 2 weeks Gildardo Pounds, NP [place]

## 2021-04-20 ENCOUNTER — Other Ambulatory Visit: Payer: Self-pay

## 2021-04-20 MED ORDER — TIZANIDINE HCL 4 MG PO TABS
4.0000 mg | ORAL_TABLET | Freq: Four times a day (QID) | ORAL | 0 refills | Status: DC | PRN
  Filled 2021-04-20: qty 30, 8d supply, fill #0

## 2021-04-24 ENCOUNTER — Other Ambulatory Visit: Payer: Self-pay

## 2021-04-24 ENCOUNTER — Ambulatory Visit
Admission: RE | Admit: 2021-04-24 | Discharge: 2021-04-24 | Disposition: A | Discharge status: Home / Self Care | Payer: No Typology Code available for payment source | Source: Ambulatory Visit | Attending: Obstetrics and Gynecology | Admitting: Obstetrics and Gynecology

## 2021-04-24 DIAGNOSIS — N6310 Unspecified lump in the right breast, unspecified quadrant: Secondary | ICD-10-CM

## 2021-04-25 LAB — FECAL OCCULT BLOOD, IMMUNOCHEMICAL: Fecal Occult Bld: NEGATIVE

## 2021-04-26 ENCOUNTER — Other Ambulatory Visit: Payer: Self-pay

## 2021-05-02 ENCOUNTER — Telehealth: Payer: Self-pay

## 2021-05-02 NOTE — Telephone Encounter (Signed)
Via Lavon Paganini, Spanish Interpreter [facility]), Patient informed negative FIT Test results. Patient verbalized understanding.

## 2021-05-04 ENCOUNTER — Other Ambulatory Visit: Payer: Self-pay

## 2021-05-04 ENCOUNTER — Ambulatory Visit: Payer: Self-pay | Attending: Nurse Practitioner | Admitting: Nurse Practitioner

## 2021-05-04 ENCOUNTER — Encounter: Payer: Self-pay | Admitting: Nurse Practitioner

## 2021-05-04 VITALS — BP 109/73 | HR 67 | Resp 20 | Ht 65.0 in | Wt 158.0 lb

## 2021-05-04 DIAGNOSIS — M533 Sacrococcygeal disorders, not elsewhere classified: Secondary | ICD-10-CM

## 2021-05-04 DIAGNOSIS — Z1211 Encounter for screening for malignant neoplasm of colon: Secondary | ICD-10-CM

## 2021-05-04 DIAGNOSIS — R1013 Epigastric pain: Secondary | ICD-10-CM

## 2021-05-04 MED ORDER — TIZANIDINE HCL 4 MG PO TABS
4.0000 mg | ORAL_TABLET | Freq: Four times a day (QID) | ORAL | 3 refills | Status: DC | PRN
  Filled 2021-05-04 – 2021-05-15 (×2): qty 30, 8d supply, fill #0
  Filled 2021-07-04: qty 30, 8d supply, fill #1
  Filled 2021-08-15: qty 30, 8d supply, fill #2
  Filled 2021-09-21: qty 30, 8d supply, fill #3

## 2021-05-04 MED ORDER — TIZANIDINE HCL 4 MG PO TABS: 4.0000 mg | ORAL_TABLET | Freq: Four times a day (QID) | ORAL | 3 refills | Status: DC | PRN

## 2021-05-04 NOTE — Progress Notes (Signed)
Assessment & Plan:  Danielle [name] was seen today for abdominal pain.  Diagnoses and all orders for this visit:  Epigastric pain -     Ambulatory referral to Gastroenterology  Sacral back pain -     Discontinue: tiZANidine (ZANAFLEX) 4 MG tablet; Take 1 tablet (4 mg total) by mouth every 6 (six) hours as needed for muscle spasms. -     tiZANidine (ZANAFLEX) 4 MG tablet; Take 1 tablet (4 mg total) by mouth every 6 (six) hours as needed for muscle spasms.  Colon cancer screening -     Ambulatory referral to Gastroenterology    Patient has been counseled on age-appropriate routine health concerns for screening and prevention. These are reviewed and up-to-date. Referrals have been placed accordingly. Immunizations are up-to-date or declined.    Subjective:   Chief Complaint  Patient presents with   Abdominal Pain   HPI Danielle [name] 46 y.o. female presents to office today for follow up to abdominal (epigastric pain)   Abdominal Pain: Patient complains of abdominal pain. The pain is described as aching, and is 8/10 in intensity. Pain is located in the epigastric with radiation to back. Onset was several months ago. Symptoms have been stable since. Aggravating factors: pressure.  Alleviating factors: none. Associated symptoms: none. The patient denies constipation, diarrhea, fever, frequency, hematochezia, hematuria, melena, nausea, and vomiting. Abdominal US only positive for hepatic steatosis.  Review of Systems  Constitutional:  Negative for fever, malaise/fatigue and weight loss.  HENT: Negative.  Negative for nosebleeds.   Eyes: Negative.  Negative for blurred vision, double vision and photophobia.  Respiratory: Negative.  Negative for cough and shortness of breath.   Cardiovascular: Negative.  Negative for chest pain, palpitations and leg swelling.  Gastrointestinal:  Positive for abdominal pain. Negative for blood in stool, constipation, heartburn, melena, nausea and vomiting.   Genitourinary: Negative.   Musculoskeletal: Negative.  Negative for myalgias.  Neurological: Negative.  Negative for dizziness, focal weakness, seizures and headaches.  Psychiatric/Behavioral: Negative.  Negative for suicidal ideas.    Past Medical History:  Diagnosis Date   History of blood transfusion 2007   "related to OR"    Past Surgical History:  Procedure Laterality Date   ABDOMINAL HYSTERECTOMY  2007   CESAREAN SECTION  2007   CHOLECYSTECTOMY N/A 04/07/2017   Procedure: LAPAROSCOPIC CHOLECYSTECTOMY;  Surgeon: Kinsinger, Arta Bruce, MD;  Location: [facility];  Service: General;  Laterality: N/A;   LAPAROSCOPIC CHOLECYSTECTOMY  04/07/2017    Family History  Problem Relation Age of Onset   Thyroid disease Mother     Social History Reviewed with no changes to be made today.   Outpatient Medications Prior to Visit  Medication Sig Dispense Refill   b complex vitamins capsule Take 1 capsule by mouth daily.     cholecalciferol (VITAMIN D3) 25 MCG (1000 UNIT) tablet Take 1,000 Units by mouth daily.     magnesium 30 MG tablet Take 30 mg by mouth once.     Multiple Vitamin (MULTIVITAMIN) tablet Take 1 tablet by mouth daily.     vitamin B-12 (CYANOCOBALAMIN) 100 MCG tablet Take 100 mcg by mouth daily.     acetaminophen-codeine (TYLENOL #3) 300-30 MG tablet Take 1 tablet by mouth every 8 (eight) hours as needed for moderate pain. (Patient not taking: Reported on 03/08/2021) 30 tablet 0   omeprazole (PRILOSEC) 20 MG capsule Take 1 capsule (20 mg total) by mouth daily. 90 capsule 1   polyethylene glycol powder (GLYCOLAX/MIRALAX) 17 GM/SCOOP powder  Take 17 g by mouth 2 (two) times daily as needed. For constipation (Patient not taking: Reported on 05/04/2021) 3350 g 1   tiZANidine (ZANAFLEX) 4 MG tablet Take 1 tablet (4 mg total) by mouth every 6 (six) hours as needed for muscle spasms. (Patient not taking: Reported on 05/04/2021) 30 tablet 0   No facility-administered medications prior to  visit.    Allergies  Allergen Reactions   Ampicillin Rash    Has patient had a PCN reaction causing immediate rash, facial/tongue/throat swelling, SOB or lightheadedness with hypotension: Yes Has patient had a PCN reaction causing severe rash involving mucus membranes or skin necrosis: Unk Has patient had a PCN reaction that required hospitalization: No Has patient had a PCN reaction occurring within the last 10 years: No If all of the above answers are "NO", then may proceed with Cephalosporin use.    Penicillins Rash    Has patient had a PCN reaction causing immediate rash, facial/tongue/throat swelling, SOB or lightheadedness with hypotension: Yes Has patient had a PCN reaction causing severe rash involving mucus membranes or skin necrosis: Unk Has patient had a PCN reaction that required hospitalization: No Has patient had a PCN reaction occurring within the last 10 years: No If all of the above answers are "NO", then may proceed with Cephalosporin use.        Objective:    BP 109/73    Pulse 67    Resp 20    Ht 5\' 5"  (1.651 m)    Wt 158 lb (71.7 kg)    SpO2 97%    BMI 26.29 kg/m  Wt Readings from Last 3 Encounters:  05/04/21 158 lb (71.7 kg)  04/10/21 158 lb 12.8 oz (72 kg)  03/08/21 155 lb 2 oz (70.4 kg)    Physical Exam Vitals and nursing note reviewed.  Constitutional:      Appearance: She is well-developed.  HENT:     Head: Normocephalic and atraumatic.  Cardiovascular:     Rate and Rhythm: Normal rate and regular rhythm.     Heart sounds: Normal heart sounds. No murmur heard.   No friction rub. No gallop.  Pulmonary:     Effort: Pulmonary effort is normal. No tachypnea or respiratory distress.     Breath sounds: Normal breath sounds. No decreased breath sounds, wheezing, rhonchi or rales.  Chest:     Chest wall: No tenderness.  Abdominal:     General: Bowel sounds are normal.     Palpations: Abdomen is soft.     Tenderness: There is abdominal tenderness in  the epigastric area.  Musculoskeletal:        General: Normal range of motion.     Cervical back: Normal range of motion.  Skin:    General: Skin is warm and dry.  Neurological:     Mental Status: She is alert and oriented to person, place, and time.     Coordination: Coordination normal.  Psychiatric:        Behavior: Behavior normal. Behavior is cooperative.        Thought Content: Thought content normal.        Judgment: Judgment normal.         Patient has been counseled extensively about nutrition and exercise as well as the importance of adherence with medications and regular follow-up. The patient was given clear instructions to go to ER or return to medical center if symptoms don't improve, worsen or new problems develop. The patient verbalized understanding.  Follow-up: Return for FASTING LABS and physical.   Gildardo Pounds, FNP-BC [facility] and [facility], [place]   05/04/2021, 10:38 AM

## 2021-05-11 ENCOUNTER — Other Ambulatory Visit: Payer: Self-pay

## 2021-05-15 ENCOUNTER — Ambulatory Visit: Payer: No Typology Code available for payment source

## 2021-05-15 ENCOUNTER — Other Ambulatory Visit: Payer: Self-pay

## 2021-07-04 ENCOUNTER — Other Ambulatory Visit: Payer: Self-pay

## 2021-07-10 ENCOUNTER — Other Ambulatory Visit: Payer: Self-pay

## 2021-07-11 ENCOUNTER — Encounter: Payer: No Typology Code available for payment source | Admitting: Nurse Practitioner

## 2021-08-07 ENCOUNTER — Encounter: Payer: Self-pay | Admitting: Physician Assistant

## 2021-08-07 ENCOUNTER — Ambulatory Visit: Payer: Self-pay | Admitting: Physician Assistant

## 2021-08-07 ENCOUNTER — Ambulatory Visit: Payer: Self-pay

## 2021-08-07 VITALS — BP 132/84 | HR 70 | Resp 16 | Ht 66.0 in | Wt 156.0 lb

## 2021-08-07 DIAGNOSIS — K2970 Gastritis, unspecified, without bleeding: Secondary | ICD-10-CM

## 2021-08-07 DIAGNOSIS — K2990 Gastroduodenitis, unspecified, without bleeding: Secondary | ICD-10-CM

## 2021-08-07 NOTE — Telephone Encounter (Signed)
   Chief Complaint: Abdominal pain in middle, vomited x 1  Symptoms: Above Frequency: 2 months ago. Has appointment with GI 08/22/21. Pertinent Negatives: Patient denies fever Disposition: '[]'$ ED /'[]'$ Urgent Care (no appt availability in office) / '[]'$ Appointment(In office/virtual)/ '[]'$  [facility]/ '[]'$ Home Care/ '[]'$ Refused Recommended Disposition /'[x]'$  [facility]/ '[]'$  Follow-up with PCP Additional Notes:   Reason for Disposition  [1] MILD-MODERATE pain AND [2] constant AND [3] present > 2 hours  Answer Assessment - Initial Assessment Questions 1. LOCATION: "Where does it hurt?"      Middle below chest 2. RADIATION: "Does the pain shoot anywhere else?" (e.g., chest, back)     No 3. ONSET: "When did the pain begin?" (e.g., minutes, hours or days ago)      2 months ago 4. SUDDEN: "Gradual or sudden onset?"     Gradual 5. PATTERN "Does the pain come and go, or is it constant?"    - If constant: "Is it getting better, staying the same, or worsening?"      (Note: Constant means the pain never goes away completely; most serious pain is constant and it progresses)     - If intermittent: "How long does it last?" "Do you have pain now?"     (Note: Intermittent means the pain goes away completely between bouts)     Constant 6. SEVERITY: "How bad is the pain?"  (e.g., Scale 1-10; mild, moderate, or severe)   - MILD (1-3): doesn't interfere with normal activities, abdomen soft and not tender to touch    - MODERATE (4-7): interferes with normal activities or awakens from sleep, abdomen tender to touch    - SEVERE (8-10): excruciating pain, doubled over, unable to do any normal activities      Now - 7-8 7. RECURRENT SYMPTOM: "Have you ever had this type of stomach pain before?" If Yes, ask: "When was the last time?" and "What happened that time?"      No 8. CAUSE: "What do you think is causing the stomach pain?"     Unsure 9. RELIEVING/AGGRAVATING FACTORS: "What makes it better or  worse?" (e.g., movement, antacids, bowel movement)     No 10. OTHER SYMPTOMS: "Do you have any other symptoms?" (e.g., back pain, diarrhea, fever, urination pain, vomiting)       Swelling 11. PREGNANCY: "Is there any chance you are pregnant?" "When was your last menstrual period?"       No  Protocols used: Abdominal Pain - [facility]

## 2021-08-07 NOTE — Patient Instructions (Signed)
I encourage you to take your omeprazole twice daily until you see gastroenterology for follow-up.  I hope that you feel better soon, please let us know if there is anything else we can do for you  Kennieth Rad, PA-C Physician Assistant [place] [email or web address]   Dolor abdominal en los adultos Abdominal Pain, Adult El dolor en el abdomen (dolor abdominal) puede tener muchas causas. A menudo, el dolor abdominal no es grave y [place] sin tratamiento o con tratamiento en la casa. Sin embargo, a Product/process development scientist abdominal es grave. El mdico le har preguntas sobre sus antecedentes mdicos y le har un examen fsico para tratar de Office manager causa del dolor abdominal. Siga estas instrucciones en su casa:  Medicamentos Use los medicamentos de venta libre y los recetados solamente como se lo haya indicado el mdico. No tome un laxante a menos que se lo haya indicado el mdico. Instrucciones generales Controle su afeccin para detectar cualquier cambio. Beba suficiente lquido como para Theatre manager la orina de color amarillo plido. Concurra a todas las visitas de seguimiento como se lo haya indicado el mdico. Esto es importante. Comunquese con un mdico si: El dolor abdominal cambia o empeora. No tiene apetito o baja de peso sin proponrselo. Est estreido o tiene diarrea durante ms de 2 o 3 das. Tiene dolor cuando orina o defeca. El dolor abdominal lo despierta de noche. El dolor empeora con las comidas, despus de comer o con determinados alimentos. Tiene vmitos y no puede retener nada de lo que ingiere. Tiene fiebre. Observa sangre en la orina. Solicite ayuda inmediatamente si: El dolor no desaparece tan pronto como el mdico le dijo que era esperable. No puede dejar de vomitar. El Social research officer, government se siente solo en zonas del abdomen, como el lado derecho o la parte inferior izquierda del abdomen. Si se localiza en la zona  derecha, posiblemente podra tratarse de apendicitis. Las heces son sanguinolentas o de color negro, o de aspecto alquitranado. Tiene dolor intenso, clicos o distensin abdominal. Tiene signos de deshidratacin, como los siguientes: Orina de color oscuro, muy escasa o falta de orina. Labios agrietados. Sequedad de boca. Ojos hundidos. Somnolencia. Debilidad. Tiene dificultad para respirar o Tourist information centre manager. Resumen A menudo, el dolor abdominal no es grave y [place] sin tratamiento o con tratamiento en la casa. Sin embargo, a Product/process development scientist abdominal es grave. Controle su afeccin para Actuary cambio. Use los medicamentos de venta libre y los recetados solamente como se lo haya indicado el mdico. Comunquese con un mdico si el dolor abdominal cambia o [place]. Busque ayuda de inmediato si tiene dolor intenso, clicos o distensin abdominal. Esta informacin no tiene Marine scientist el consejo del mdico. Asegrese de hacerle al mdico cualquier pregunta que tenga. Document Revised: 09/02/2018 Document Reviewed: 09/02/2018 Elsevier Patient Education  [place].

## 2021-08-07 NOTE — Progress Notes (Signed)
Established Patient Office Visit  Subjective   Patient ID: Danielle [name], female    DOB: May 20, 1975  Age: 46 y.o. MRN: [id number]  Chief Complaint  Patient presents with   Abdominal Pain    Vomiting yesterday and chills. Bump in stomach that is hurting.     States that she continues to have epigastric abdominal pain, states that she has had this for "sometime" . States that she feels pain when she pushes on her epigastric area and describes it as burning.  States that she has previously felt a lump in that area and is concerned that it is getting bigger.  States pain is worse after eating.  Does endorse one episode of vomiting yesterday.   States that she just restarted the omeprazole 20 mg last night and took another dose earlier today.  States appt with GI 6/14  Due to language barrier, an interpreter was present during the history-taking and subsequent discussion (and for part of the physical exam) with this patient.   Past Medical History:  Diagnosis Date   History of blood transfusion 2007   "related to OR"   Social History   Socioeconomic History   Marital status: Married    Spouse name: Not on file   Number of children: 3   Years of education: Not on file   Highest education level: 9th grade  Occupational History   Not on file  Tobacco Use   Smoking status: Never   Smokeless tobacco: Never  Vaping Use   Vaping Use: Never used  Substance and Sexual Activity   Alcohol use: No   Drug use: No   Sexual activity: Yes    Birth control/protection: Surgical  Other Topics Concern   Not on file  Social History Narrative   Not on file   Social Determinants of Health   Financial Resource Strain: Not on file  Food Insecurity: No Food Insecurity   Worried About Running Out of Food in the Last Year: Never true   [place] in the Last Year: Never true  Transportation Needs: No Transportation Needs   Lack of Transportation (Medical): No   Lack of  Transportation (Non-Medical): No  Physical Activity: Not on file  Stress: Not on file  Social Connections: Not on file  Intimate Partner Violence: Not on file   Family History  Problem Relation Age of Onset   Thyroid disease Mother    Allergies  Allergen Reactions   Ampicillin Rash    Has patient had a PCN reaction causing immediate rash, facial/tongue/throat swelling, SOB or lightheadedness with hypotension: Yes Has patient had a PCN reaction causing severe rash involving mucus membranes or skin necrosis: Unk Has patient had a PCN reaction that required hospitalization: No Has patient had a PCN reaction occurring within the last 10 years: No If all of the above answers are "NO", then may proceed with Cephalosporin use.    Penicillins Rash    Has patient had a PCN reaction causing immediate rash, facial/tongue/throat swelling, SOB or lightheadedness with hypotension: Yes Has patient had a PCN reaction causing severe rash involving mucus membranes or skin necrosis: Unk Has patient had a PCN reaction that required hospitalization: No Has patient had a PCN reaction occurring within the last 10 years: No If all of the above answers are "NO", then may proceed with Cephalosporin use.       Review of Systems  Constitutional:  Negative for chills and fever.  HENT: Negative.  Eyes: Negative.   Respiratory:  Negative for shortness of breath.   Cardiovascular:  Negative for chest pain.  Gastrointestinal:  Positive for abdominal pain and vomiting.  Genitourinary: Negative.   Musculoskeletal: Negative.   Skin: Negative.   Neurological: Negative.   Endo/Heme/Allergies: Negative.   Psychiatric/Behavioral: Negative.       Objective:     BP 132/84 (BP Location: Left Arm, Patient Position: Sitting, Cuff Size: Normal)   Pulse 70   Resp 16   Ht '5\' 6"'$  (1.676 m)   Wt 156 lb (70.8 kg)   SpO2 100%   BMI 25.18 kg/m    Physical Exam Vitals and nursing note reviewed.   Constitutional:      Appearance: Normal appearance. She is well-developed.  HENT:     Head: Normocephalic and atraumatic.     Right Ear: External ear normal.     Left Ear: External ear normal.     Nose: Nose normal.     Mouth/Throat:     Mouth: Mucous membranes are moist.     Pharynx: Oropharynx is clear.  Eyes:     Extraocular Movements: Extraocular movements intact.     Conjunctiva/sclera: Conjunctivae normal.     Pupils: Pupils are equal, round, and reactive to light.  Cardiovascular:     Rate and Rhythm: Normal rate.     Pulses: Normal pulses.     Heart sounds: Normal heart sounds.  Pulmonary:     Effort: Pulmonary effort is normal.     Breath sounds: Normal breath sounds.  Abdominal:     General: Abdomen is flat and scaphoid. Bowel sounds are normal.     Tenderness: There is abdominal tenderness in the epigastric area.     Comments: Unable to palpate "bump"  Musculoskeletal:        General: Normal range of motion.     Cervical back: Normal range of motion and neck supple.  Skin:    General: Skin is warm and dry.  Neurological:     General: No focal deficit present.     Mental Status: She is alert and oriented to person, place, and time. Mental status is at baseline.  Psychiatric:        Mood and Affect: Mood normal.        Behavior: Behavior normal.        Thought Content: Thought content normal.        Judgment: Judgment normal.        Assessment & Plan:   Problem List Items Addressed This Visit       Digestive   Gastritis and gastroduodenitis - Primary   1. Gastritis and gastroduodenitis Patient has had repeat negative H. pylori testings completed, right upper quadrant ultrasound in January 2023.  Patient encouraged to continue taking the omeprazole twice daily and keep follow-up appointment with gastroenterology.  Patient understands and agrees.  Red flags given for prompt reevaluation.   I have reviewed the patient's medical history (PMH, PSH, Social  History, Family History, Medications, and allergies) , and have been updated if relevant. I spent 30 minutes reviewing chart and  face to face time with patient.    Return if symptoms worsen or fail to improve.    Loraine Grip Mayers, PA-C

## 2021-08-14 ENCOUNTER — Ambulatory Visit (INDEPENDENT_AMBULATORY_CARE_PROVIDER_SITE_OTHER): Payer: Self-pay | Admitting: Gastroenterology

## 2021-08-14 ENCOUNTER — Other Ambulatory Visit (INDEPENDENT_AMBULATORY_CARE_PROVIDER_SITE_OTHER): Payer: Self-pay

## 2021-08-14 ENCOUNTER — Encounter: Payer: Self-pay | Admitting: Gastroenterology

## 2021-08-14 ENCOUNTER — Other Ambulatory Visit: Payer: Self-pay

## 2021-08-14 VITALS — BP 116/84 | HR 64 | Ht 64.5 in | Wt 156.2 lb

## 2021-08-14 DIAGNOSIS — K760 Fatty (change of) liver, not elsewhere classified: Secondary | ICD-10-CM

## 2021-08-14 DIAGNOSIS — R1013 Epigastric pain: Secondary | ICD-10-CM

## 2021-08-14 DIAGNOSIS — Z1211 Encounter for screening for malignant neoplasm of colon: Secondary | ICD-10-CM

## 2021-08-14 DIAGNOSIS — R6881 Early satiety: Secondary | ICD-10-CM

## 2021-08-14 LAB — CBC WITH DIFFERENTIAL/PLATELET
Basophils Absolute: 0 10*3/uL (ref 0.0–0.1)
Basophils Relative: 0.6 % (ref 0.0–3.0)
Eosinophils Absolute: 0.1 10*3/uL (ref 0.0–0.7)
Eosinophils Relative: 3.1 % (ref 0.0–5.0)
HCT: 43 % (ref 36.0–46.0)
Hemoglobin: 14.2 g/dL (ref 12.0–15.0)
Lymphocytes Relative: 32.8 % (ref 12.0–46.0)
Lymphs Abs: 1.6 10*3/uL (ref 0.7–4.0)
MCHC: 33 g/dL (ref 30.0–36.0)
MCV: 89.3 fl (ref 78.0–100.0)
Monocytes Absolute: 0.3 10*3/uL (ref 0.1–1.0)
Monocytes Relative: 6.2 % (ref 3.0–12.0)
Neutro Abs: 2.7 10*3/uL (ref 1.4–7.7)
Neutrophils Relative %: 57.3 % (ref 43.0–77.0)
Platelets: 170 10*3/uL (ref 150.0–400.0)
RBC: 4.82 Mil/uL (ref 3.87–5.11)
RDW: 13.2 % (ref 11.5–15.5)
WBC: 4.8 10*3/uL (ref 4.0–10.5)

## 2021-08-14 LAB — PROTIME-INR
INR: 1 ratio (ref 0.8–1.0)
Prothrombin Time: 11.1 s (ref 9.6–13.1)

## 2021-08-14 MED ORDER — OMEPRAZOLE 40 MG PO CPDR
40.0000 mg | DELAYED_RELEASE_CAPSULE | Freq: Two times a day (BID) | ORAL | 3 refills | Status: DC
  Filled 2021-08-14: qty 60, 30d supply, fill #0

## 2021-08-14 MED ORDER — SUCRALFATE 1 G PO TABS
1.0000 g | ORAL_TABLET | Freq: Four times a day (QID) | ORAL | 1 refills | Status: DC | PRN
  Filled 2021-08-14: qty 60, 15d supply, fill #0
  Filled 2021-12-06: qty 60, 15d supply, fill #1

## 2021-08-14 NOTE — Progress Notes (Signed)
HPI :  46 year old female with a history of cholecystectomy, hysterectomy, fatty liver, self-referred here for symptoms of abdominal pain.  History is provided through translator.  She states in December she felt epigastric discomfort for the first time.  She states she felt a "ball" in her epigastric area that was painful and bothered her.  She went to the emergency room and she had some lab work done and an H. pylori breath test which was negative.  She subsequently had a right upper quadrant ultrasound in December which showed some fatty liver but postcholecystectomy findings and no abnormality noted which would correlate with her symptoms.  She states since this time she has had intermittent pain in her epigastric area.  She states some days she will feel it for hours at a time, and then will go a few days and not feel it at all.  She feels a burning sensation in her epigastric area that can radiate into her back.  She feels anxiety to eat as sometimes eating foods can make it worse although has a hard time identifying clear triggers for this in regards to specific foods.  She has had 2 episodes of nausea with vomiting but that is not common for her.  She does not have much heartburn or pyrosis, denies any dysphagia.  She does have some early satiety associated with the symptoms.  She has been using ibuprofen as needed, 600 mg at a time, hard to say exactly how frequently she has been taking this.  Looks like she tried omeprazole 20 mg over-the-counter and took it just a few doses in recent days but has not been on any antacid for a period of time that she endorses.  She thinks over time her symptoms are progressively getting worse.  She denies any weight loss at all, she is able to eat okay.  She denies any alcohol or tobacco use.  She denies any blood in her stools.  Denies any problems with her bowels.  She is never had a prior EGD or colonoscopy.  She is not aware of any first-degree family  history of colon cancer, gastric cancer, or esophageal cancer.  She had a cholecystectomy in 2019.  She is currently uninsured.  Prior workup: FOBT negative 04/23/21 H pylori breath test negative 03/08/21   RUQ [place] 03/16/2021: . Suspected hepatic steatosis.  Correlation with LFTs is advised. 2. Post cholecystectomy. 3. No sonographic correlate for patient's palpable area of concern involving the epigastric region of the abdomen.   Past Medical History:  Diagnosis Date   History of blood transfusion 2007   "related to OR"     Past Surgical History:  Procedure Laterality Date   ABDOMINAL HYSTERECTOMY  2007   CESAREAN SECTION  2007   CHOLECYSTECTOMY N/A 04/07/2017   Procedure: LAPAROSCOPIC CHOLECYSTECTOMY;  Surgeon: Mickeal Skinner, MD;  Location: [facility];  Service: General;  Laterality: N/A;   LAPAROSCOPIC CHOLECYSTECTOMY  04/07/2017   Family History  Problem Relation Age of Onset   Thyroid disease Mother    Colon cancer Neg Hx    Esophageal cancer Neg Hx    Social History   Tobacco Use   Smoking status: Never   Smokeless tobacco: Never  Vaping Use   Vaping Use: Never used  Substance Use Topics   Alcohol use: No   Drug use: No   Current Outpatient Medications  Medication Sig Dispense Refill   b complex vitamins capsule Take 1 capsule by mouth daily.  omeprazole (PRILOSEC) 40 MG capsule Take 1 capsule (40 mg total) by mouth 2 (two) times daily. 60 capsule 3   sucralfate (CARAFATE) 1 g tablet Take 1 tablet (1 g total) by mouth every 6 (six) hours as needed. 60 tablet 1   tiZANidine (ZANAFLEX) 4 MG tablet Take 1 tablet (4 mg total) by mouth every 6 (six) hours as needed for muscle spasms. 30 tablet 3   cholecalciferol (VITAMIN D3) 25 MCG (1000 UNIT) tablet Take 1,000 Units by mouth daily. (Patient not taking: Reported on 08/14/2021)     No current facility-administered medications for this visit.   Allergies  Allergen Reactions   Ampicillin Rash    Has patient  had a PCN reaction causing immediate rash, facial/tongue/throat swelling, SOB or lightheadedness with hypotension: Yes Has patient had a PCN reaction causing severe rash involving mucus membranes or skin necrosis: Unk Has patient had a PCN reaction that required hospitalization: No Has patient had a PCN reaction occurring within the last 10 years: No If all of the above answers are "NO", then may proceed with Cephalosporin use.    Penicillins Rash    Has patient had a PCN reaction causing immediate rash, facial/tongue/throat swelling, SOB or lightheadedness with hypotension: Yes Has patient had a PCN reaction causing severe rash involving mucus membranes or skin necrosis: Unk Has patient had a PCN reaction that required hospitalization: No Has patient had a PCN reaction occurring within the last 10 years: No If all of the above answers are "NO", then may proceed with Cephalosporin use.      Review of Systems: All systems reviewed and negative except where noted in HPI.   Lab Results  Component Value Date   WBC 5.8 03/08/2021   HGB 13.9 03/08/2021   HCT 41.8 03/08/2021   MCV 87 03/08/2021   PLT 194 03/08/2021    Lab Results  Component Value Date   CREATININE 0.71 03/08/2021   BUN 13 03/08/2021   NA 143 03/08/2021   K 4.5 03/08/2021   CL 106 03/08/2021   CO2 24 03/08/2021    Lab Results  Component Value Date   ALT 23 03/08/2021   AST 17 03/08/2021   ALKPHOS 113 03/08/2021   BILITOT 0.5 03/08/2021     Physical Exam: BP 116/84   Pulse 64   Ht 5' 4.5" (1.638 m) Comment: with shoes  Wt 156 lb 4 oz (70.9 kg)   BMI 26.41 kg/m  Constitutional: Pleasant,well-developed, female in no acute distress. HEENT: Normocephalic and atraumatic. Conjunctivae are normal. No scleral icterus. Neck supple.  Cardiovascular: Normal rate, regular rhythm.  Pulmonary/chest: Effort normal and breath sounds normal. No wheezing, rales or rhonchi. Abdominal: Soft, nondistended, some focal  tenderness to palpation just inferior to lower sternum There are no masses palpable. No hepatomegaly. Extremities: no edema Lymphadenopathy: No cervical adenopathy noted. Neurological: Alert and oriented to person place and time. Skin: Skin is warm and dry. No rashes noted. Psychiatric: Normal mood and affect. Behavior is normal.   ASSESSMENT AND PLAN: 47 year old female here for new patient assessment the following:  Epigastric pain Early satiety Fatty liver Colon cancer screening  As above, almost 6 months worth of intermittent epigastric pain that can radiate into her back.  She is postcholecystectomy, right upper quadrant ultrasound and labs have looked okay.  She is using ibuprofen periodically for this, no good trial of PPI yet.  She tested negative for H. pylori breath test.  Discussed differential diagnosis with her and options.  I am recommending repeat CBC and CMET to make sure stable.  Recommend trial of high-dose PPI for a few weeks to see if this will make her feel better, discussed using omeprazole 40 mg twice daily and she wants to try that.  We will also give her some Carafate 1 g as needed every 6 hours for flares of pain.  I recommend she stop all NSAIDs at this point in time.  Ultimately recommending an EGD to further evaluate the symptoms, however she has no insurance.  We discussed options.  I do not think she can afford an EGD without insurance right now.  Recommended applying for orange card, information provided and she will work on that.  If she gets acceptance for orange card coverage, then she will schedule an EGD to further evaluate the symptoms if they persist, and optical colonoscopy for screening purposes.  If symptoms persist and EGD negative, would consider CT scan to further evaluate.  Other etiology could potentially be costochondritis/nerve impingement although symptoms not typical for that.  She agrees with the plan, I asked her to contact me in a few weeks  with an update on how she is doing and if she wants to schedule an EGD based on her coverage for orange card/insurance.  Plan: - lab today for CBC and CMET - start Omeprazole '40mg'$  BID for 1 month trial - start Carafate 1gm tablet every 6 hours PRN #30 RF1 - stop ibuprofen and other NSAIDs - recommended an EGD, she is going to work on [employer] and contact us in a few weeks to see if insurance coverage is possible and when this may occur. She cannot afford an EGD without insurance from discussion of this today.  - if she gets insurance will also perform screening colonoscopy at time of EGD - if symptoms persist despite trial of medical therapy and EGD negative, would consider CT scan.  Jolly Mango, MD [facility]

## 2021-08-14 NOTE — Patient Instructions (Addendum)
If you are age 46 or older, your body mass index should be between 23-30. Your Body mass index is 26.41 kg/m. If this is out of the aforementioned range listed, please consider follow up with your Primary Care Provider.  If you are age 44 or younger, your body mass index should be between 19-25. Your Body mass index is 26.41 kg/m. If this is out of the aformentioned range listed, please consider follow up with your Primary Care Provider.   ________________________________________________________  The [facility] providers would like to encourage you to use [facility] to communicate with providers for non-urgent requests or questions.  Due to long hold times on the telephone, sending your provider a message by [facility] may be a faster and more efficient way to get a response.  Please allow 48 business hours for a response.  Please remember that this is for non-urgent requests.  _______________________________________________________  Please go to the lab in the basement of our building to have lab work done as you leave today. Hit "B" for basement when you get on the elevator.  When the doors open the lab is on your left.  We will call you with the results. Thank you.  We have sent the following medications to your pharmacy for you to pick up at your convenience: Omeprazole 40 mg: Take twice daily Carafate tabs: Take 1 tablet every 6 hours as needed  Stop Ibuprofen.  We are giving you an application to apply for the [employer].  It has been recommended to you by your physician that you have a(n) EGD/Colonoscopy completed. Per your request, we did not schedule the procedure(s) today. Please contact our office at [phone] should you decide to have the procedure completed. You will be scheduled for a pre-visit and procedure at that time.   Thank you for entrusting me with your care and for choosing [facility], Dr. [place]

## 2021-08-15 ENCOUNTER — Other Ambulatory Visit: Payer: Self-pay

## 2021-08-15 DIAGNOSIS — R6881 Early satiety: Secondary | ICD-10-CM

## 2021-08-15 DIAGNOSIS — R1013 Epigastric pain: Secondary | ICD-10-CM

## 2021-08-15 DIAGNOSIS — K760 Fatty (change of) liver, not elsewhere classified: Secondary | ICD-10-CM

## 2021-08-17 ENCOUNTER — Other Ambulatory Visit (INDEPENDENT_AMBULATORY_CARE_PROVIDER_SITE_OTHER): Payer: Self-pay

## 2021-08-17 DIAGNOSIS — R6881 Early satiety: Secondary | ICD-10-CM

## 2021-08-17 DIAGNOSIS — K760 Fatty (change of) liver, not elsewhere classified: Secondary | ICD-10-CM

## 2021-08-17 DIAGNOSIS — R1013 Epigastric pain: Secondary | ICD-10-CM

## 2021-08-17 LAB — COMPREHENSIVE METABOLIC PANEL
ALT: 30 U/L (ref 0–35)
AST: 19 U/L (ref 0–37)
Albumin: 4.3 g/dL (ref 3.5–5.2)
Alkaline Phosphatase: 111 U/L (ref 39–117)
BUN: 14 mg/dL (ref 6–23)
CO2: 28 mEq/L (ref 19–32)
Calcium: 9.4 mg/dL (ref 8.4–10.5)
Chloride: 104 mEq/L (ref 96–112)
Creatinine, Ser: 0.61 mg/dL (ref 0.40–1.20)
GFR: 107.55 mL/min (ref >60.00)
Glucose, Bld: 94 mg/dL (ref 70–99)
Potassium: 3.8 mEq/L (ref 3.5–5.1)
Sodium: 140 mEq/L (ref 135–145)
Total Bilirubin: 1 mg/dL (ref 0.2–1.2)
Total Protein: 7.1 g/dL (ref 6.0–8.3)

## 2021-08-20 ENCOUNTER — Other Ambulatory Visit: Payer: Self-pay

## 2021-08-22 ENCOUNTER — Ambulatory Visit: Payer: No Typology Code available for payment source | Admitting: Internal Medicine

## 2021-09-21 ENCOUNTER — Other Ambulatory Visit: Payer: Self-pay

## 2021-09-25 ENCOUNTER — Other Ambulatory Visit: Payer: Self-pay

## 2021-11-21 ENCOUNTER — Other Ambulatory Visit: Payer: Self-pay | Admitting: Nurse Practitioner

## 2021-11-21 ENCOUNTER — Ambulatory Visit: Payer: Self-pay | Admitting: *Deleted

## 2021-11-21 ENCOUNTER — Other Ambulatory Visit: Payer: Self-pay | Admitting: *Deleted

## 2021-11-21 DIAGNOSIS — M533 Sacrococcygeal disorders, not elsewhere classified: Secondary | ICD-10-CM

## 2021-11-21 NOTE — Telephone Encounter (Signed)
Requested medication (s) are due for refill today: yes  Requested medication (s) are on the active medication list: yes  Last refill:  08/14/21 #60 3 refills  Future visit scheduled: yes in 1 month  Notes to clinic:  last ordered by Remo Lipps :Havery Moros MD. Do you want to order/ refill Rx? Patient is out of medication .     Requested Prescriptions  Pending Prescriptions Disp Refills   omeprazole (PRILOSEC) 40 MG capsule 60 capsule 3    Sig: Take 1 capsule (40 mg total) by mouth 2 (two) times daily.     Gastroenterology: Proton Pump Inhibitors Passed - 11/21/2021 12:27 PM      Passed - Valid encounter within last 12 months    Recent Outpatient Visits           6 months ago Epigastric pain   [place], Vernia Buff, NP   8 months ago RUQ fullness   [place], [place], [place]   1 year ago Encounter for Papanicolaou smear for cervical cancer screening   [place], Vernia Buff, NP   2 years ago Stomach pain   [place], [place], [place]   2 years ago Stomach pain   [place], Dionne Bucy, [place]       Future Appointments             In 1 month [place], Dionne Bucy, PA-C [place]

## 2021-11-21 NOTE — Telephone Encounter (Signed)
Medication Refill - Medication: tiZANidine (ZANAFLEX) 4 MG tablet  Has the patient contacted their pharmacy? Yes.   (Agent: If no, request that the patient contact the pharmacy for the refill. If patient does not wish to contact the pharmacy document the reason why and proceed with request.) (Agent: If yes, when and what did the pharmacy advise?)  Preferred Pharmacy (with phone number or street name):  [facility] [address], [place] [address]  Phone: [phone] Fax: [phone]   Has the patient been seen for an appointment in the last year OR does the patient have an upcoming appointment? Yes.    Agent: Please be advised that RX refills may take up to 3 business days. We ask that you follow-up with your pharmacy.

## 2021-11-21 NOTE — Telephone Encounter (Signed)
[name] GH#[id number] Chief Complaint: abdominal pain  Symptoms: abominal pain in "pit of stomach". C/o feeling like a ball or lump under breast area of abdomen. Pain now constant but does not interfere normal activities.  Frequency: months Pertinent Negatives: Patient denies fever no N/V Disposition: '[]'$ ED /'[x]'$ Urgent Care (no appt availability in office) / '[]'$ Appointment(In office/virtual)/ '[]'$  [facility]/ '[]'$ Home Care/ '[]'$ Refused Recommended Disposition /'[]'$ [facility]/ '[]'$  Follow-up with PCP Additional Notes:   Requesting appt earlier than 01/02/22.  Requesting medication refills for omeprazole.  Reason for Disposition  [1] MILD pain (e.g., does not interfere with normal activities) AND [2] pain comes and goes (cramps) AND [3] present > 48 hours  (Exception: This same abdominal pain is a chronic symptom recurrent or ongoing AND present > 4 weeks.)    Reports constant pain not severe  Answer Assessment - Initial Assessment Questions 1. LOCATION: "Where does it hurt?"      "Pit of stomach below breast" 2. RADIATION: "Does the pain shoot anywhere else?" (e.g., chest, back)     Toward right side of back  3. ONSET: "When did the pain begin?" (e.g., minutes, hours or days ago)      "For a while' has been seen by specialist  4. SUDDEN: "Gradual or sudden onset?"     na 5. PATTERN "Does the pain come and go, or is it constant?"    - If it comes and goes: "How long does it last?" "Do you have pain now?"     (Note: Comes and goes means the pain is intermittent. It goes away completely between bouts.)    - If constant: "Is it getting better, staying the same, or getting worse?"      (Note: Constant means the pain never goes away completely; most serious pain is constant and gets worse.)      Constant  6. SEVERITY: "How bad is the pain?"  (e.g., Scale 1-10; mild, moderate, or severe)    - MILD (1-3): Doesn't interfere with normal activities, abdomen soft and not tender to  touch.     - MODERATE (4-7): Interferes with normal activities or awakens from sleep, abdomen tender to touch.     - SEVERE (8-10): Excruciating pain, doubled over, unable to do any normal activities.       Mild  7. RECURRENT SYMPTOM: "Have you ever had this type of stomach pain before?" If Yes, ask: "When was the last time?" and "What happened that time?"      Yes  8. CAUSE: "What do you think is causing the stomach pain?"     Took medication from specialist and now ran out  9. RELIEVING/AGGRAVATING FACTORS: "What makes it better or worse?" (e.g., antacids, bending or twisting motion, bowel movement)     Took OTC prilosec  10. OTHER SYMPTOMS: "Do you have any other symptoms?" (e.g., back pain, diarrhea, fever, urination pain, vomiting)       Back pain right side  11. PREGNANCY: "Is there any chance you are pregnant?" "When was your last menstrual period?"       na  Protocols used: Abdominal Pain - [facility]

## 2021-11-22 NOTE — Telephone Encounter (Signed)
FYI

## 2021-11-22 NOTE — Telephone Encounter (Signed)
Requested medication (s) are due for refill today: yes  Requested medication (s) are on the active medication list: yes  Last refill:  05/04/21  Future visit scheduled: yes  Notes to clinic:  Unable to refill per protocol, cannot delegate.  Patient has future OV scheduled.     Requested Prescriptions  Pending Prescriptions Disp Refills   tiZANidine (ZANAFLEX) 4 MG tablet 30 tablet 3    Sig: Take 1 tablet (4 mg total) by mouth every 6 (six) hours as needed for muscle spasms.     Not Delegated - Cardiovascular:  Alpha-2 Agonists - tizanidine Failed - 11/21/2021  2:26 PM      Failed - This refill cannot be delegated      Failed - Valid encounter within last 6 months    Recent Outpatient Visits           6 months ago Epigastric pain   [place], Vernia Buff, NP   8 months ago RUQ fullness   [place], [place], [place]   1 year ago Encounter for Papanicolaou smear for cervical cancer screening   [place], Vernia Buff, NP   2 years ago Stomach pain   [place], [place], [place]   2 years ago Stomach pain   [place], Dionne Bucy, [place]       Future Appointments             In 1 month [place], Dionne Bucy, PA-C [place]

## 2021-11-22 NOTE — Telephone Encounter (Signed)
No earlier appointments available. Please advise if a virtual can be done.

## 2021-11-23 NOTE — Telephone Encounter (Signed)
Please have her follow up with GI. She is already seeing them for this

## 2021-11-23 NOTE — Telephone Encounter (Signed)
Unable to reach patient by phone Voicemail not set up.Marland Kitchen

## 2021-12-06 ENCOUNTER — Other Ambulatory Visit: Payer: Self-pay

## 2021-12-10 ENCOUNTER — Other Ambulatory Visit: Payer: Self-pay

## 2021-12-12 ENCOUNTER — Other Ambulatory Visit: Payer: Self-pay

## 2021-12-12 ENCOUNTER — Ambulatory Visit: Payer: Self-pay | Attending: Physician Assistant | Admitting: Physician Assistant

## 2021-12-12 ENCOUNTER — Encounter: Payer: Self-pay | Admitting: Physician Assistant

## 2021-12-12 VITALS — BP 127/75 | HR 71 | Temp 98.1°F | Ht 64.0 in | Wt 164.6 lb

## 2021-12-12 DIAGNOSIS — Z789 Other specified health status: Secondary | ICD-10-CM

## 2021-12-12 DIAGNOSIS — R1013 Epigastric pain: Secondary | ICD-10-CM

## 2021-12-12 DIAGNOSIS — R233 Spontaneous ecchymoses: Secondary | ICD-10-CM

## 2021-12-12 DIAGNOSIS — R0982 Postnasal drip: Secondary | ICD-10-CM

## 2021-12-12 DIAGNOSIS — M533 Sacrococcygeal disorders, not elsewhere classified: Secondary | ICD-10-CM

## 2021-12-12 DIAGNOSIS — R202 Paresthesia of skin: Secondary | ICD-10-CM

## 2021-12-12 MED ORDER — TIZANIDINE HCL 4 MG PO TABS
4.0000 mg | ORAL_TABLET | Freq: Four times a day (QID) | ORAL | 3 refills | Status: DC | PRN
  Filled 2021-12-12: qty 30, 8d supply, fill #0
  Filled 2022-01-21: qty 30, 8d supply, fill #1
  Filled 2022-02-20: qty 30, 8d supply, fill #2

## 2021-12-12 MED ORDER — FLUTICASONE PROPIONATE 50 MCG/ACT NA SUSP
2.0000 | Freq: Every day | NASAL | 6 refills | Status: DC
  Filled 2021-12-12: qty 16, 30d supply, fill #0

## 2021-12-12 MED ORDER — GABAPENTIN 300 MG PO CAPS
300.0000 mg | ORAL_CAPSULE | Freq: Every day | ORAL | 1 refills | Status: DC
  Filled 2021-12-12: qty 90, 90d supply, fill #0

## 2021-12-12 MED ORDER — OMEPRAZOLE 40 MG PO CPDR
40.0000 mg | DELAYED_RELEASE_CAPSULE | Freq: Two times a day (BID) | ORAL | 3 refills | Status: DC
  Filled 2021-12-12: qty 60, 30d supply, fill #0

## 2021-12-12 NOTE — Progress Notes (Signed)
Patient ID: Danielle [name], female   DOB: Oct 04, 1975, 46 y.o.   MRN: [id number]     Danielle [name], is a 46 y.o. female  YSA:[id number]  NAT:[id number]  DOB - 10/10/1975  Chief Complaint  Patient presents with   Abdominal Pain    Mass on upper abdomen. Bruising easily, irritated throat, numbing of hands & feet. Pt is uninsured currently working on orange card. Med refill No flu vax.       Subjective:   Danielle [name] is a 46 y.o. female here today for R hand and both feet with numbness esp at night.  This has been going on for several months.  Whole hand goes numb(not just partial)  no loss in grip or strength.    Also c/o easy bruising.  CBC and INR normal in June.    Sees GI 10/11 for continued abdominal issues  3 week h/o throat irritation.  Throat feels dry.  No fever.  No sneezing.    No problems updated.  ALLERGIES: Allergies  Allergen Reactions   Ampicillin Rash    Has patient had a PCN reaction causing immediate rash, facial/tongue/throat swelling, SOB or lightheadedness with hypotension: Yes Has patient had a PCN reaction causing severe rash involving mucus membranes or skin necrosis: Unk Has patient had a PCN reaction that required hospitalization: No Has patient had a PCN reaction occurring within the last 10 years: No If all of the above answers are "NO", then may proceed with Cephalosporin use.    Penicillins Rash    Has patient had a PCN reaction causing immediate rash, facial/tongue/throat swelling, SOB or lightheadedness with hypotension: Yes Has patient had a PCN reaction causing severe rash involving mucus membranes or skin necrosis: Unk Has patient had a PCN reaction that required hospitalization: No Has patient had a PCN reaction occurring within the last 10 years: No If all of the above answers are "NO", then may proceed with Cephalosporin use.     PAST MEDICAL HISTORY: Past Medical History:  Diagnosis Date   History of blood transfusion 2007    "related to OR"    MEDICATIONS AT HOME: Prior to Admission medications   Medication Sig Start Date End Date Taking? Authorizing Provider  fluticasone (FLONASE) 50 MCG/ACT nasal spray Place 2 sprays into both nostrils daily. 12/12/21  Yes Argentina Donovan, PA-C  gabapentin (NEURONTIN) 300 MG capsule Take 1 capsule (300 mg total) by mouth at bedtime. Prn numbness 12/12/21  Yes Argentina Donovan, PA-C  b complex vitamins capsule Take 1 capsule by mouth daily. Patient not taking: Reported on 12/12/2021    [provider]  cholecalciferol (VITAMIN D3) 25 MCG (1000 UNIT) tablet Take 1,000 Units by mouth daily. Patient not taking: Reported on 12/12/2021    [provider]  omeprazole (PRILOSEC) 40 MG capsule Take 1 capsule (40 mg total) by mouth 2 (two) times daily. 12/12/21   Argentina Donovan, PA-C  sucralfate (CARAFATE) 1 g tablet Take 1 tablet (1 g total) by mouth every 6 (six) hours as needed. Patient not taking: Reported on 12/12/2021 08/14/21   Yetta Flock, MD  tiZANidine (ZANAFLEX) 4 MG tablet Take 1 tablet (4 mg total) by mouth every 6 (six) hours as needed for muscle spasms. 12/12/21   Lekisha Mcghee, Dionne Bucy, PA-C    ROS: Neg resp Neg cardiac Neg GI Neg GU Neg psych Neg neuro  Objective:   Vitals:   12/12/21 1452  BP: 127/75  Pulse: 71  Temp: 98.1 F (36.7  C)  TempSrc: Oral  SpO2: 96%  Weight: 164 lb 9.6 oz (74.7 kg)  Height: '5\' 4"'$  (1.626 m)   Exam General appearance : Awake, alert, not in any distress. Speech Clear. Not toxic looking HEENT: Atraumatic and Normocephalic; B TM congested.  Throat w/o erythema or exudate.  +PND Neck: Supple, no JVD. No cervical lymphadenopathy.  Chest: Good air entry bilaterally, CTAB.  No rales/rhonchi/wheezing CVS: S1 S2 regular, no murmurs.  Extremities: B/L Lower Ext shows no edema, both legs are warm to touch.  BUE full S&ROM.  Normal grip.  Neg phalen's; neg tinels Neurology: Awake alert, and oriented X 3, CN II-XII  intact, Non focal Skin: No Rash  Data Review Lab Results  Component Value Date   HGBA1C 5.6 05/18/2014   HGBA1C 5.4 10/12/2012    Assessment & Plan   1. Paresthesia - TSH - gabapentin (NEURONTIN) 300 MG capsule; Take 1 capsule (300 mg total) by mouth at bedtime. Prn numbness  Dispense: 90 capsule; Refill: 1  2. Post-nasal drip - fluticasone (FLONASE) 50 MCG/ACT nasal spray; Place 2 sprays into both nostrils daily.  Dispense: 16 g; Refill: 6  3. Easy bruising - CBC with Differential/Platelet  4. Sacral back pain - tiZANidine (ZANAFLEX) 4 MG tablet; Take 1 tablet (4 mg total) by mouth every 6 (six) hours as needed for muscle spasms.  Dispense: 30 tablet; Refill: 3  5. Epigastric pain Keep appt with GI - omeprazole (PRILOSEC) 40 MG capsule; Take 1 capsule (40 mg total) by mouth 2 (two) times daily.  Dispense: 60 capsule; Refill: 3  6.  Language barrier [employer] interpreters "Corey Skains" used and additional time performing visit was required.   Return if symptoms worsen or fail to improve.  The patient was given clear instructions to go to ER or return to medical center if symptoms don't improve, worsen or new problems develop. The patient verbalized understanding. The patient was told to call to get lab results if they haven't heard anything in the next week.      Freeman Caldron, PA-C [facility], [place]   12/12/2021, 3:29 PM

## 2021-12-13 ENCOUNTER — Other Ambulatory Visit: Payer: Self-pay

## 2021-12-13 LAB — CBC WITH DIFFERENTIAL/PLATELET
Basophils Absolute: 0 10*3/uL (ref 0.0–0.2)
Basos: 0 %
EOS (ABSOLUTE): 0.1 10*3/uL (ref 0.0–0.4)
Eos: 2 %
Hematocrit: 41.9 % (ref 34.0–46.6)
Hemoglobin: 13.6 g/dL (ref 11.1–15.9)
Immature Grans (Abs): 0 10*3/uL (ref 0.0–0.1)
Immature Granulocytes: 0 %
Lymphocytes Absolute: 2.9 10*3/uL (ref 0.7–3.1)
Lymphs: 40 %
MCH: 28.8 pg (ref 26.6–33.0)
MCHC: 32.5 g/dL (ref 31.5–35.7)
MCV: 89 fL (ref 79–97)
Monocytes Absolute: 0.4 10*3/uL (ref 0.1–0.9)
Monocytes: 5 %
Neutrophils Absolute: 3.8 10*3/uL (ref 1.4–7.0)
Neutrophils: 53 %
Platelets: 211 10*3/uL (ref 150–450)
RBC: 4.72 x10E6/uL (ref 3.77–5.28)
RDW: 13.2 % (ref 11.7–15.4)
WBC: 7.3 10*3/uL (ref 3.4–10.8)

## 2021-12-13 LAB — TSH: TSH: 1.05 u[IU]/mL (ref 0.450–4.500)

## 2021-12-19 ENCOUNTER — Ambulatory Visit: Payer: Self-pay | Admitting: Gastroenterology

## 2022-01-02 ENCOUNTER — Ambulatory Visit: Payer: Self-pay | Admitting: Physician Assistant

## 2022-01-15 ENCOUNTER — Encounter: Payer: Self-pay | Admitting: Gastroenterology

## 2022-01-16 ENCOUNTER — Telehealth: Payer: Self-pay | Admitting: Emergency Medicine

## 2022-01-16 ENCOUNTER — Encounter: Payer: Self-pay | Admitting: Nurse Practitioner

## 2022-01-16 NOTE — Telephone Encounter (Signed)
Copied from [place] [id number]. Topic: Appointment Scheduling - Scheduling Inquiry for Clinic >> Jan 16, 2022 12:02 PM Ja-Kwan M wrote: Reason for CRM: Pt would like to schedule an appt for financial assistance. Cb# [phone]

## 2022-01-16 NOTE — Telephone Encounter (Signed)
Copied from [place] [id number]. Topic: Appointment Scheduling - Scheduling Inquiry for Clinic >> Jan 16, 2022 12:02 PM Ja-Kwan M wrote: Reason for CRM: Pt would like to schedule an appt for financial assistance. Cb# [phone]

## 2022-01-21 ENCOUNTER — Other Ambulatory Visit: Payer: Self-pay

## 2022-01-22 ENCOUNTER — Other Ambulatory Visit: Payer: Self-pay

## 2022-01-29 ENCOUNTER — Ambulatory Visit (AMBULATORY_SURGERY_CENTER): Payer: Self-pay | Admitting: *Deleted

## 2022-01-29 ENCOUNTER — Other Ambulatory Visit: Payer: Self-pay

## 2022-01-29 VITALS — Ht 64.5 in | Wt 165.0 lb

## 2022-01-29 DIAGNOSIS — R1013 Epigastric pain: Secondary | ICD-10-CM

## 2022-01-29 DIAGNOSIS — Z1211 Encounter for screening for malignant neoplasm of colon: Secondary | ICD-10-CM

## 2022-01-29 DIAGNOSIS — R6881 Early satiety: Secondary | ICD-10-CM

## 2022-01-29 MED ORDER — NA SULFATE-K SULFATE-MG SULF 17.5-3.13-1.6 GM/177ML PO SOLN
1.0000 | Freq: Once | ORAL | 0 refills | Status: AC
  Filled 2022-01-29: qty 354, 2d supply, fill #0

## 2022-01-29 NOTE — Progress Notes (Signed)
No egg or soy allergy known to patient  No issues known to pt with past sedation with any surgeries or procedures Patient denies ever being told they had issues or difficulty with intubation  No FH of Malignant Hyperthermia Pt is not on diet pills Pt is not on  home 02  Pt is not on blood thinners  Pt denies issues with constipation  No A fib or A flutter Have any cardiac testing pending--NO Pt instructed to use Singlecare.com or GoodRx for a price reduction on prep    Patient's chart reviewed by Osvaldo Angst CNRA prior to previsit and patient appropriate for the [facility].  Previsit completed and red dot placed by patient's name on their procedure day (on provider's schedule).     Pre-visit completed via spanish interpreter by language line.

## 2022-02-04 ENCOUNTER — Other Ambulatory Visit: Payer: Self-pay

## 2022-02-20 ENCOUNTER — Other Ambulatory Visit: Payer: Self-pay

## 2022-02-21 ENCOUNTER — Other Ambulatory Visit: Payer: Self-pay

## 2022-02-27 ENCOUNTER — Ambulatory Visit (AMBULATORY_SURGERY_CENTER): Payer: Self-pay | Admitting: Gastroenterology

## 2022-02-27 ENCOUNTER — Encounter: Payer: Self-pay | Admitting: Gastroenterology

## 2022-02-27 VITALS — BP 120/72 | HR 54 | Temp 98.6°F | Resp 22 | Ht 64.5 in | Wt 158.0 lb

## 2022-02-27 DIAGNOSIS — D124 Benign neoplasm of descending colon: Secondary | ICD-10-CM

## 2022-02-27 DIAGNOSIS — R1013 Epigastric pain: Secondary | ICD-10-CM

## 2022-02-27 DIAGNOSIS — Z1211 Encounter for screening for malignant neoplasm of colon: Secondary | ICD-10-CM

## 2022-02-27 DIAGNOSIS — K317 Polyp of stomach and duodenum: Secondary | ICD-10-CM

## 2022-02-27 DIAGNOSIS — D125 Benign neoplasm of sigmoid colon: Secondary | ICD-10-CM

## 2022-02-27 MED ORDER — SODIUM CHLORIDE 0.9 % IV SOLN: 500.0000 mL | Freq: Once | INTRAVENOUS | Status: DC

## 2022-02-27 NOTE — Progress Notes (Signed)
Vitals-DT  Pt's states no medical or surgical changes since previsit or office visit.  

## 2022-02-27 NOTE — Op Note (Signed)
[place] Patient Name: Danielle [name] Procedure Date: 02/27/2022 2:09 PM MRN: [id number] Endoscopist: Remo Lipps P. Havery Moros , MD, [id number] Age: 46 Referring MD:  Date of Birth: 1975-07-13 Gender: Female Account #: [id number] Procedure:                Colonoscopy Indications:              Screening for colorectal malignant neoplasm, This                            is the patient's first colonoscopy Medicines:                Monitored Anesthesia Care Procedure:                Pre-Anesthesia Assessment:                           - Prior to the procedure, a History and Physical                            was performed, and patient medications and                            allergies were reviewed. The patient's tolerance of                            previous anesthesia was also reviewed. The risks                            and benefits of the procedure and the sedation                            options and risks were discussed with the patient.                            All questions were answered, and informed consent                            was obtained. Prior Anticoagulants: The patient has                            taken no anticoagulant or antiplatelet agents. ASA                            Grade Assessment: II - A patient with mild systemic                            disease. After reviewing the risks and benefits,                            the patient was deemed in satisfactory condition to                            undergo the procedure.  After obtaining informed consent, the colonoscope                            was passed under direct vision. Throughout the                            procedure, the patient's blood pressure, pulse, and                            oxygen saturations were monitored continuously. The                            Olympus PCF-H190DL [id number] [id number]) Colonoscope was                            introduced through  the anus and advanced to the the                            cecum, identified by appendiceal orifice and                            ileocecal valve. The colonoscopy was performed                            without difficulty. The patient tolerated the                            procedure well. The quality of the bowel                            preparation was adequate. The ileocecal valve,                            appendiceal orifice, and rectum were photographed. Scope In: 2:10:34 PM Scope Out: 2:31:48 PM Scope Withdrawal Time: 0 hours 18 minutes 0 seconds  Total Procedure Duration: 0 hours 21 minutes 14 seconds  Findings:                 The perianal and digital rectal examinations were                            normal.                           A large amount of liquid stool was found in the                            entire colon, making visualization difficult.                            Lavage of the colon was performed using copious                            amounts of sterile water, resulting in clearance  with adequate visualization.                           Many medium-mouthed diverticula were found in the                            right colon.                           A 4 mm polyp was found in the descending colon. The                            polyp was sessile. The polyp was removed with a                            cold snare. Resection and retrieval were complete.                           A 3 mm polyp was found in the sigmoid colon. The                            polyp was sessile. The polyp was removed with a                            cold snare. Resection and retrieval were complete.                           Internal hemorrhoids were found during                            retroflexion. The hemorrhoids were small.                           The exam was otherwise without abnormality. Complications:            No immediate  complications. Estimated blood loss:                            Minimal. Estimated Blood Loss:     Estimated blood loss was minimal. Impression:               - Stool in the entire examined colon requiring                            significant lavage.                           - Diverticulosis in the right colon.                           - One 4 mm polyp in the descending colon, removed                            with a cold snare. Resected and retrieved.                           -  One 3 mm polyp in the sigmoid colon, removed with                            a cold snare. Resected and retrieved.                           - Internal hemorrhoids.                           - The examination was otherwise normal. Recommendation:           - Patient has a contact number available for                            emergencies. The signs and symptoms of potential                            delayed complications were discussed with the                            patient. Return to normal activities tomorrow.                            Written discharge instructions were provided to the                            patient.                           - Resume previous diet.                           - Continue present medications.                           - Await pathology results. Remo Lipps P. Havery Moros, MD 02/27/2022 2:37:39 PM This report has been signed electronically.

## 2022-02-27 NOTE — Progress Notes (Signed)
Sedate, gd SR, tolerated procedure well, VSS, report to RN 

## 2022-02-27 NOTE — Patient Instructions (Signed)
Please read handouts provided. Continue present medications. Resume trial of omeprazole as needed if that helped symptoms. Await pathology results.  USTED TUVO UN PROCEDIMIENTO ENDOSCPICO HOY EN EL [facility]:   Lea el informe del procedimiento que se le entreg para cualquier pregunta especfica sobre lo que se Primary school teacher.  Si el informe del examen no responde a sus preguntas, por favor llame a su gastroenterlogo para aclararlo.  Si usted solicit que no se le den [employer] de lo que se Estate manager/land agent en su procedimiento al [employer] va a cuidar, entonces el informe del procedimiento se ha incluido en un sobre sellado para que usted lo revise despus cuando le sea ms conveniente.   LO QUE PUEDE ESPERAR: Algunas sensaciones de hinchazn en el abdomen.  Puede tener ms gases de lo normal.  El caminar puede ayudarle a eliminar el aire que se le puso en el tracto gastrointestinal durante el procedimiento y reducir la hinchazn.  Si le hicieron una endoscopia inferior (como una colonoscopia o una sigmoidoscopia flexible), podra notar manchas de sangre en las heces fecales o en el papel higinico.  Si se someti a una preparacin intestinal para su procedimiento, es posible que no tenga una evacuacin intestinal normal durante [employer].   Tenga en cuenta:  Es posible que note un poco de irritacin y congestin en la nariz o algn drenaje.  Esto es debido al oxgeno [employer] durante su procedimiento.  No hay que preocuparse y esto debe desaparecer ms o Scientist, research (medical).   SNTOMAS PARA REPORTAR INMEDIATAMENTE:  Despus de una endoscopia inferior (colonoscopia o sigmoidoscopia flexible):  Cantidades excesivas de sangre en las heces fecales  Sensibilidad significativa o empeoramiento de los dolores abdominales   Hinchazn aguda del abdomen que antes no tena   Fiebre de 100F o ms   Despus de la endoscopia superior (EGD)  Vmitos de Biochemist, clinical o material como caf  molido   Dolor en el pecho o dolor debajo de los omplatos que antes no tena   Dolor o dificultad persistente para tragar  Falta de aire que antes no tena   Fiebre de 100F o ms  Heces fecales negras y pegajosas   Para asuntos urgentes o de Freight forwarder, puede comunicarse con un gastroenterlogo a cualquier hora llamando al [phone].  DIETA:  Recomendamos una comida pequea al principio, pero luego puede continuar con su dieta normal.  Tome muchos lquidos, Teacher, adult education las bebidas alcohlicas durante 24 horas.    ACTIVIDAD:  Debe planear tomarse las cosas con calma por el resto del da y no debe CONDUCIR ni usar maquinaria pesada Programmer, applications (debido a los medicamentos de sedacin utilizados durante el examen).     SEGUIMIENTO: Nuestro personal llamar al nmero que aparece en su historial al siguiente da hbil de su procedimiento para ver cmo se siente y para responder cualquier pregunta o inquietud que pueda tener con respecto a la informacin que se le dio despus del procedimiento. Si no podemos contactarle, le dejaremos un mensaje.  Sin embargo, si se siente bien y no tiene Paediatric nurse, no es necesario que nos devuelva la llamada.  Asumiremos que ha regresado a sus actividades diarias normales sin incidentes. Si se le tomaron algunas biopsias, le contactaremos por telfono o por carta en las prximas 3 semanas.  Si no ha sabido [employer] biopsias en el transcurso de 3 semanas, por favor llmenos al [phone].   FIRMAS/CONFIDENCIALIDAD: Waldron Session y/o el  acompaante que le cuide han firmado documentos que se ingresarn en su historial mdico electrnico.  Estas firmas atestiguan el hecho de que la informacin anterior

## 2022-02-27 NOTE — Progress Notes (Signed)
[facility] History and Physical   Primary Care Physician:  Gildardo Pounds, NP   Reason for Procedure:   Epigastric pain, colon cancer screening  Plan:    EGD and colonoscopy     HPI: Danielle [name] is a 46 y.o. female  here for EGD and colonoscopy to evaluate issues above. I last saw her in June. Having intermittent severe epigasric pain, post cholecystectomy. Had placed her on omeprazole and carafate.  First time colonoscopy. She reports her epigastric pain was better on omeprazole, she ran out. Did not resolve but better than when I last saw her. Patient denies any bowel symptoms at this time. No family history of colon cancer known. Otherwise feels well without any cardiopulmonary symptoms.   I have discussed risks / benefits of anesthesia and endoscopic procedure with [name] and they wish to proceed with the exams as outlined today.    Past Medical History:  Diagnosis Date   Chronic back pain    Depression    History of blood transfusion 2007   "related to OR"    Past Surgical History:  Procedure Laterality Date   ABDOMINAL HYSTERECTOMY  2007   CESAREAN SECTION  2007   CHOLECYSTECTOMY N/A 04/07/2017   Procedure: LAPAROSCOPIC CHOLECYSTECTOMY;  Surgeon: Kieth Brightly Arta Bruce, MD;  Location: [facility];  Service: General;  Laterality: N/A;   LAPAROSCOPIC CHOLECYSTECTOMY  04/07/2017    Prior to Admission medications   Medication Sig Start Date End Date Taking? Authorizing Provider  b complex vitamins capsule Take 1 capsule by mouth daily.    [provider]  cholecalciferol (VITAMIN D3) 25 MCG (1000 UNIT) tablet Take 1,000 Units by mouth daily. Patient not taking: Reported on 12/12/2021    [provider]  fluticasone (FLONASE) 50 MCG/ACT nasal spray Place 2 sprays into both nostrils daily. Patient not taking: Reported on 01/29/2022 12/12/21   Argentina Donovan, PA-C  gabapentin (NEURONTIN) 300 MG capsule Take 1 capsule (300 mg total) by  mouth at bedtime as needed for numbness 12/12/21   Freeman Caldron M, PA-C  omeprazole (PRILOSEC) 40 MG capsule Take 1 capsule (40 mg total) by mouth 2 (two) times daily. Patient not taking: Reported on 01/29/2022 12/12/21   Argentina Donovan, PA-C  sucralfate (CARAFATE) 1 g tablet Take 1 tablet (1 g total) by mouth every 6 (six) hours as needed. Patient not taking: Reported on 12/12/2021 08/14/21   Yetta Flock, MD  tiZANidine (ZANAFLEX) 4 MG tablet Take 1 tablet (4 mg total) by mouth every 6 (six) hours as needed for muscle spasms. 12/12/21   Argentina Donovan, PA-C    Current Outpatient Medications  Medication Sig Dispense Refill   b complex vitamins capsule Take 1 capsule by mouth daily.     cholecalciferol (VITAMIN D3) 25 MCG (1000 UNIT) tablet Take 1,000 Units by mouth daily. (Patient not taking: Reported on 12/12/2021)     fluticasone (FLONASE) 50 MCG/ACT nasal spray Place 2 sprays into both nostrils daily. (Patient not taking: Reported on 01/29/2022) 16 g 6   gabapentin (NEURONTIN) 300 MG capsule Take 1 capsule (300 mg total) by mouth at bedtime as needed for numbness 90 capsule 1   omeprazole (PRILOSEC) 40 MG capsule Take 1 capsule (40 mg total) by mouth 2 (two) times daily. (Patient not taking: Reported on 01/29/2022) 60 capsule 3   sucralfate (CARAFATE) 1 g tablet Take 1 tablet (1 g total) by mouth every 6 (six) hours as needed. (Patient not taking: Reported on  12/12/2021) 60 tablet 1   tiZANidine (ZANAFLEX) 4 MG tablet Take 1 tablet (4 mg total) by mouth every 6 (six) hours as needed for muscle spasms. 30 tablet 3   Current Facility-Administered Medications  Medication Dose Route Frequency Provider Last Rate Last Admin   0.9 %  sodium chloride infusion  500 mL Intravenous Once Derrisha Foos, Carlota Raspberry, MD        Allergies as of 02/27/2022 - Review Complete 02/27/2022  Allergen Reaction Noted   Ampicillin Rash 06/29/2008   Penicillins Rash 02/25/2008    Family History  Problem  Relation Age of Onset   Thyroid disease Mother    Cancer Paternal Uncle    Colon cancer Neg Hx    Esophageal cancer Neg Hx    Colon polyps Neg Hx    Crohn's disease Neg Hx    Rectal cancer Neg Hx    Stomach cancer Neg Hx    Ulcerative colitis Neg Hx     Social History   Socioeconomic History   Marital status: Married    Spouse name: Not on file   Number of children: 3   Years of education: Not on file   Highest education level: 9th grade  Occupational History   Not on file  Tobacco Use   Smoking status: Never    Passive exposure: Never   Smokeless tobacco: Never  Vaping Use   Vaping Use: Never used  Substance and Sexual Activity   Alcohol use: No   Drug use: No   Sexual activity: Yes    Birth control/protection: Surgical  Other Topics Concern   Not on file  Social History Narrative   Not on file   Social Determinants of Health   Financial Resource Strain: Not on file  Food Insecurity: No Food Insecurity (04/10/2021)   Hunger Vital Sign    Worried About Running Out of Food in the Last Year: Never true    Ran Out of Food in the Last Year: Never true  Transportation Needs: No Transportation Needs (04/10/2021)   PRAPARE - Hydrologist (Medical): No    Lack of Transportation (Non-Medical): No  Physical Activity: Not on file  Stress: Not on file  Social Connections: Not on file  Intimate Partner Violence: Not on file    Review of Systems: All other review of systems negative except as mentioned in the HPI.  Physical Exam: Vital signs BP 111/72 (BP Location: Right Arm, Patient Position: Sitting, Cuff Size: Normal)   Pulse 68   Temp 98.6 F (37 C) (Temporal)   Ht 5' 4.5" (1.638 m)   Wt 158 lb (71.7 kg)   SpO2 99%   BMI 26.70 kg/m   General:   Alert,  Well-developed, pleasant and cooperative in NAD Lungs:  Clear throughout to auscultation.   Heart:  Regular rate and rhythm Abdomen:  Soft, nontender and nondistended.    Neuro/Psych:  Alert and cooperative. Normal mood and affect. A and O x 3  Jolly Mango, MD [facility]

## 2022-02-27 NOTE — Op Note (Addendum)
[place] Patient Name: Danielle [name] Procedure Date: 02/27/2022 1:59 PM MRN: [id number] Endoscopist: Remo Lipps P. Havery Moros , MD, [id number] Age: 46 Referring MD:  Date of Birth: 1975/09/16 Gender: Female Account #: [id number] Procedure:                Upper GI endoscopy Indications:              Epigastric abdominal pain - trial of PPI provided                            benefit, not resolved yet Medicines:                Monitored Anesthesia Care Procedure:                Pre-Anesthesia Assessment:                           - Prior to the procedure, a History and Physical                            was performed, and patient medications and                            allergies were reviewed. The patient's tolerance of                            previous anesthesia was also reviewed. The risks                            and benefits of the procedure and the sedation                            options and risks were discussed with the patient.                            All questions were answered, and informed consent                            was obtained. Prior Anticoagulants: The patient has                            taken no anticoagulant or antiplatelet agents. ASA                            Grade Assessment: II - A patient with mild systemic                            disease. After reviewing the risks and benefits,                            the patient was deemed in satisfactory condition to                            undergo the procedure.  After obtaining informed consent, the endoscope was                            passed under direct vision. Throughout the                            procedure, the patient's blood pressure, pulse, and                            oxygen saturations were monitored continuously. The                            GIF [id number] #[id number] was introduced through the                            mouth, and advanced to  the second part of duodenum.                            The upper GI endoscopy was accomplished without                            difficulty. The patient tolerated the procedure                            well. Scope In: Scope Out: Findings:                 Esophagogastric landmarks were identified: the                            Z-line was found at 36 cm, the gastroesophageal                            junction was found at 36 cm and the upper extent of                            the gastric folds was found at 38 cm from the                            incisors.                           A 2 cm hiatal hernia was present.                           The exam of the esophagus was otherwise normal.                           A single 3 to 4 mm sessile polyp was found in the                            cardia. Biopsies were taken with a cold forceps for  histology which seemingly removed the lesion.                           The exam of the stomach was otherwise normal.                           Biopsies were taken with a cold forceps in the                            gastric body, at the incisura and in the gastric                            antrum for Helicobacter pylori testing.                           The examined duodenum was normal. Complications:            No immediate complications. Estimated blood loss:                            Minimal. Estimated Blood Loss:     Estimated blood loss was minimal. Impression:               - Esophagogastric landmarks identified.                           - 2 cm hiatal hernia.                           - Normal esophagus otherwise.                           - A single gastric polyp. Biopsied.                           - Normal stomach otherwise - biopsies obtained to                            rule out H pylori                           - Normal examined duodenum. Recommendation:           - Patient has a contact number  available for                            emergencies. The signs and symptoms of potential                            delayed complications were discussed with the                            patient. Return to normal activities tomorrow.                            Written discharge instructions were provided to the  patient.                           - Resume previous diet.                           - Continue present medications.                           - Resume trial of omeprazole as needed if that                            helped symptoms                           - Await pathology results. Remo Lipps P. Taylinn Brabant, MD 02/27/2022 2:44:30 PM This report has been signed electronically.

## 2022-02-27 NOTE — Progress Notes (Signed)
Called to room to assist during endoscopic procedure.  Patient ID and intended procedure confirmed with present staff. Received instructions for my participation in the procedure from the performing physician.  

## 2022-02-28 ENCOUNTER — Telehealth: Payer: Self-pay | Admitting: *Deleted

## 2022-02-28 NOTE — Telephone Encounter (Signed)
  Follow up Call-     02/27/2022    1:14 PM 02/27/2022    1:05 PM  Call back number  Post procedure Call Back phone  # [phone]   Permission to leave phone message  Yes     Patient questions:  Do you have a fever, pain , or abdominal swelling? No. Pain Score  0 *  Have you tolerated food without any problems? Yes.    Have you been able to return to your normal activities? Yes.    Do you have any questions about your discharge instructions: Diet   No. Medications  No. Follow up visit  No.  Do you have questions or concerns about your Care? No.  Actions: * If pain score is 4 or above: No action needed, pain <4.

## 2022-03-27 ENCOUNTER — Other Ambulatory Visit: Payer: Self-pay

## 2022-03-27 ENCOUNTER — Encounter: Payer: Self-pay | Admitting: Nurse Practitioner

## 2022-03-27 ENCOUNTER — Ambulatory Visit: Payer: Self-pay | Attending: Nurse Practitioner | Admitting: Nurse Practitioner

## 2022-03-27 VITALS — HR 87 | Temp 99.1°F | Wt 159.6 lb

## 2022-03-27 DIAGNOSIS — J101 Influenza due to other identified influenza virus with other respiratory manifestations: Secondary | ICD-10-CM

## 2022-03-27 DIAGNOSIS — M533 Sacrococcygeal disorders, not elsewhere classified: Secondary | ICD-10-CM

## 2022-03-27 LAB — POCT INFLUENZA A/B
Influenza A, POC: NEGATIVE
Influenza B, POC: POSITIVE — AB

## 2022-03-27 MED ORDER — TIZANIDINE HCL 4 MG PO TABS
4.0000 mg | ORAL_TABLET | Freq: Four times a day (QID) | ORAL | 3 refills | Status: DC | PRN
  Filled 2022-03-27: qty 30, 8d supply, fill #0
  Filled 2022-05-01: qty 30, 8d supply, fill #1
  Filled 2022-06-04: qty 30, 8d supply, fill #2
  Filled 2022-07-03: qty 30, 8d supply, fill #3

## 2022-03-27 NOTE — Progress Notes (Signed)
Danielle [name]- [id number]  Onset of symptoms 1 week.  Theraflu and robitussin to help relieve sx with no relief.

## 2022-03-27 NOTE — Progress Notes (Signed)
Assessment & Plan:  Danielle [name] was seen today for generalized body aches.  Diagnoses and all orders for this visit:  Influenza B -     Influenza A/B  Sacral back pain -     tiZANidine (ZANAFLEX) 4 MG tablet; Take 1 tablet (4 mg total) by mouth every 6 (six) hours as needed for muscle spasms.    Patient has been counseled on age-appropriate routine health concerns for screening and prevention. These are reviewed and up-to-date. Referrals have been placed accordingly. Immunizations are up-to-date or declined.    Subjective:   Chief Complaint  Patient presents with   Generalized Body Aches   HPI Danielle [name] 47 y.o. female presents to office today for URI with cough and congestion  VRI was used to communicate directly with patient for the entire encounter including providing detailed patient instructions.     FLU LIKE SYMPTOMS Onset of symptoms one week ago last wednesday.  She endorses the following symptoms: sore throat, productive cough, rhinorrhea, nasal, fever temp today 99.1, Body aches.  Denies shortness of breath or chest pain. Did not take COVID test.  Husband was ill as well as 53 year old daughter diagnosed with the flu today.   Review of Systems  Constitutional:  Positive for chills, fever and malaise/fatigue.  HENT:  Positive for congestion and sore throat. Negative for ear discharge, ear pain, hearing loss and sinus pain.   Eyes: Negative.   Respiratory:  Positive for cough. Negative for sputum production, shortness of breath and wheezing.   Cardiovascular: Negative.  Negative for chest pain, orthopnea and leg swelling.  Gastrointestinal: Negative.  Negative for abdominal pain, diarrhea, nausea and vomiting.  Neurological:  Positive for headaches. Negative for dizziness and focal weakness.  Endo/Heme/Allergies:  Negative for environmental allergies.  Psychiatric/Behavioral: Negative.      Past Medical History:  Diagnosis Date   Chronic back pain     Depression    History of blood transfusion 2007   "related to OR"    Past Surgical History:  Procedure Laterality Date   ABDOMINAL HYSTERECTOMY  2007   CESAREAN SECTION  2007   CHOLECYSTECTOMY N/A 04/07/2017   Procedure: LAPAROSCOPIC CHOLECYSTECTOMY;  Surgeon: Kinsinger, Arta Bruce, MD;  Location: [facility];  Service: General;  Laterality: N/A;   LAPAROSCOPIC CHOLECYSTECTOMY  04/07/2017    Family History  Problem Relation Age of Onset   Thyroid disease Mother    Cancer Paternal Uncle    Colon cancer Neg Hx    Esophageal cancer Neg Hx    Colon polyps Neg Hx    Crohn's disease Neg Hx    Rectal cancer Neg Hx    Stomach cancer Neg Hx    Ulcerative colitis Neg Hx     Social History Reviewed with no changes to be made today.   Outpatient Medications Prior to Visit  Medication Sig Dispense Refill   fluticasone (FLONASE) 50 MCG/ACT nasal spray Place 2 sprays into both nostrils daily. 16 g 6   gabapentin (NEURONTIN) 300 MG capsule Take 1 capsule (300 mg total) by mouth at bedtime as needed for numbness 90 capsule 1   omeprazole (PRILOSEC) 40 MG capsule Take 1 capsule (40 mg total) by mouth 2 (two) times daily. 60 capsule 3   b complex vitamins capsule Take 1 capsule by mouth daily.     cholecalciferol (VITAMIN D3) 25 MCG (1000 UNIT) tablet Take 1,000 Units by mouth daily.     sucralfate (CARAFATE) 1 g tablet Take 1 tablet (  1 g total) by mouth every 6 (six) hours as needed. 60 tablet 1   tiZANidine (ZANAFLEX) 4 MG tablet Take 1 tablet (4 mg total) by mouth every 6 (six) hours as needed for muscle spasms. 30 tablet 3   No facility-administered medications prior to visit.    Allergies  Allergen Reactions   Ampicillin Rash    Has patient had a PCN reaction causing immediate rash, facial/tongue/throat swelling, SOB or lightheadedness with hypotension: Yes Has patient had a PCN reaction causing severe rash involving mucus membranes or skin necrosis: Unk Has patient had a PCN reaction that  required hospitalization: No Has patient had a PCN reaction occurring within the last 10 years: No If all of the above answers are "NO", then may proceed with Cephalosporin use.    Penicillins Rash    Has patient had a PCN reaction causing immediate rash, facial/tongue/throat swelling, SOB or lightheadedness with hypotension: Yes Has patient had a PCN reaction causing severe rash involving mucus membranes or skin necrosis: Unk Has patient had a PCN reaction that required hospitalization: No Has patient had a PCN reaction occurring within the last 10 years: No If all of the above answers are "NO", then may proceed with Cephalosporin use.        Objective:    Pulse 87   Temp 99.1 F (37.3 C) (Oral)   Wt 159 lb 9.6 oz (72.4 kg)   SpO2 97%   BMI 26.97 kg/m  Wt Readings from Last 3 Encounters:  03/27/22 159 lb 9.6 oz (72.4 kg)  02/27/22 158 lb (71.7 kg)  01/29/22 165 lb (74.8 kg)    Physical Exam Constitutional:      Appearance: She is well-developed. She is ill-appearing.  HENT:     Head: Normocephalic.     Right Ear: Hearing, ear canal and external ear normal. No middle ear effusion.     Left Ear: Hearing, ear canal and external ear normal.  No middle ear effusion.     Nose: Mucosal edema present. No rhinorrhea.     Right Sinus: No maxillary sinus tenderness or frontal sinus tenderness.     Left Sinus: No maxillary sinus tenderness or frontal sinus tenderness.     Mouth/Throat:     Pharynx: No oropharyngeal exudate or posterior oropharyngeal erythema.     Tonsils: No tonsillar abscesses.  Neck:     Thyroid: No thyromegaly.  Cardiovascular:     Rate and Rhythm: Normal rate and regular rhythm.     Heart sounds: No murmur heard.    No friction rub. No gallop.  Pulmonary:     Effort: Pulmonary effort is normal. No respiratory distress.     Breath sounds: Normal breath sounds. No wheezing or rales.  Chest:     Chest wall: No tenderness.  Abdominal:     General: Bowel  sounds are normal.     Palpations: Abdomen is soft.  Musculoskeletal:        General: Normal range of motion.     Cervical back: Normal range of motion.  Lymphadenopathy:     Cervical: No cervical adenopathy.  Skin:    General: Skin is warm and dry.  Neurological:     Mental Status: She is alert and oriented to person, place, and time.  Psychiatric:        Behavior: Behavior normal.        Thought Content: Thought content normal.        Judgment: Judgment normal.  Patient has been counseled extensively about nutrition and exercise as well as the importance of adherence with medications and regular follow-up. The patient was given clear instructions to go to ER or return to medical center if symptoms don't improve, worsen or new problems develop. The patient verbalized understanding.   Follow-up: Return if symptoms worsen or fail to improve.   Gildardo Pounds, FNP-BC [facility], [place]   03/27/2022, 3:36 PM

## 2022-04-01 ENCOUNTER — Other Ambulatory Visit: Payer: Self-pay

## 2022-05-01 ENCOUNTER — Other Ambulatory Visit: Payer: Self-pay

## 2022-05-02 ENCOUNTER — Other Ambulatory Visit: Payer: Self-pay

## 2022-06-04 ENCOUNTER — Other Ambulatory Visit: Payer: Self-pay

## 2022-06-05 ENCOUNTER — Other Ambulatory Visit: Payer: Self-pay

## 2022-06-05 MED ORDER — IBUPROFEN 600 MG PO TABS
600.0000 mg | ORAL_TABLET | ORAL | 0 refills | Status: DC
  Filled 2022-06-05: qty 16, 4d supply, fill #0

## 2022-07-03 ENCOUNTER — Other Ambulatory Visit: Payer: Self-pay

## 2022-08-07 ENCOUNTER — Other Ambulatory Visit: Payer: Self-pay | Admitting: Nurse Practitioner

## 2022-08-07 ENCOUNTER — Other Ambulatory Visit: Payer: Self-pay

## 2022-08-07 DIAGNOSIS — M533 Sacrococcygeal disorders, not elsewhere classified: Secondary | ICD-10-CM

## 2022-08-07 MED ORDER — TIZANIDINE HCL 4 MG PO TABS
4.0000 mg | ORAL_TABLET | Freq: Four times a day (QID) | ORAL | 0 refills | Status: DC | PRN
  Filled 2022-08-07: qty 30, 8d supply, fill #0

## 2022-09-23 ENCOUNTER — Ambulatory Visit: Payer: Self-pay

## 2022-09-23 ENCOUNTER — Ambulatory Visit: Payer: Self-pay | Admitting: Nurse Practitioner

## 2022-09-23 NOTE — Telephone Encounter (Signed)
   Used Spanish interpreter [place], [place] [id number] Chief Complaint: Abdominal pain - middle of stomach Symptoms: Pain, vomiting Frequency: February Pertinent Negatives: Patient denies any other symptoms Disposition: [] ED /[] Urgent Care (no appt availability in office) / [x] Appointment(In office/virtual)/ []  [place] [facility]/ [] Home Care/ [] Refused Recommended Disposition /[] [place] [facility]/ []  Follow-up with PCP Additional Notes:   Reason for Disposition  [1] MILD-MODERATE pain AND [2] constant AND [3] present > 2 hours  Answer Assessment - Initial Assessment Questions 1. LOCATION: "Where does it hurt?"      Middle 2. RADIATION: "Does the pain shoot anywhere else?" (e.g., chest, back)     No 3. ONSET: "When did the pain begin?" (e.g., minutes, hours or days ago)      Feb 4. SUDDEN: "Gradual or sudden onset?"     Gradual 5. PATTERN "Does the pain come and go, or is it constant?"    - If it comes and goes: "How long does it last?" "Do you have pain now?"     (Note: Comes and goes means the pain is intermittent. It goes away completely between bouts.)    - If constant: "Is it getting better, staying the same, or getting worse?"      (Note: Constant means the pain never goes away completely; most serious pain is constant and gets worse.)      Comes and goes 6. SEVERITY: "How bad is the pain?"  (e.g., Scale 1-10; mild, moderate, or severe)    - MILD (1-3): Doesn't interfere with normal activities, abdomen soft and not tender to touch.     - MODERATE (4-7): Interferes with normal activities or awakens from sleep, abdomen tender to touch.     - SEVERE (8-10): Excruciating pain, doubled over, unable to do any normal activities.       5 7. RECURRENT SYMPTOM: "Have you ever had this type of stomach pain before?" If Yes, ask: "When was the last time?" and "What happened that time?"      No 8. CAUSE: "What do you think is causing the stomach pain?"     Hernia 9.  RELIEVING/AGGRAVATING FACTORS: "What makes it better or worse?" (e.g., antacids, bending or twisting motion, bowel movement)     No 10. OTHER SYMPTOMS: "Do you have any other symptoms?" (e.g., back pain, diarrhea, fever, urination pain, vomiting)       Vomiting 11. PREGNANCY: "Is there any chance you are pregnant?" "When was your last menstrual period?"       No  Protocols used: Abdominal Pain - [facility]

## 2022-09-24 ENCOUNTER — Ambulatory Visit: Payer: Self-pay | Admitting: *Deleted

## 2022-09-24 NOTE — Telephone Encounter (Signed)
  Chief Complaint: Vomiting yellow emesis and urinating dark yellow urine. Symptoms: Having pain in the top of her stomach. Frequency: Started 09/23/2022 Yesterday Pertinent Negatives: Patient denies sick exposures Disposition: [] ED /[] Urgent Care (no appt availability in office) / [] Appointment(In office/virtual)/ []  [place] [facility]/ [] Home Care/ [] Refused Recommended Disposition /[x] [place] [facility]/ []  Follow-up with PCP Additional Notes: No appts available with [facility].   Referred her to the [facility] which she was agreeable to going to.   I gave her the location and hours.    Spanish interpreter was [place] [phone]

## 2022-09-24 NOTE — Telephone Encounter (Signed)
Reason for Disposition  [1] MODERATE pain (e.g., interferes with normal activities) AND [2] pain comes and goes (cramps) AND [3] present > 24 hours  (Exception: Pain with Vomiting or Diarrhea - see that Guideline.)  Answer Assessment - Initial Assessment Questions 1. LOCATION: "Where does it hurt?"      Pt had an appt yesterday but cancelled.  I was on the way to the appt but my Zenaida Niece broke down.   They were not able to hold the appt for me.   She spoke with a triage nurse yesterday. I'm wanting an appt. Since I could not make it in yesterday.     My vomit is yellow.   Been vomiting since yesterday.   My urine is very yellow too.  No burning with urination.  My right lower back is hurting.     2. RADIATION: "Does the pain shoot anywhere else?" (e.g., chest, back)     I'm having pain in the top of my stomach too.   3. ONSET: "When did the pain begin?" (e.g., minutes, hours or days ago)      Yesterday 4. SUDDEN: "Gradual or sudden onset?"     Not asked 5. PATTERN "Does the pain come and go, or is it constant?"    - If it comes and goes: "How long does it last?" "Do you have pain now?"     (Note: Comes and goes means the pain is intermittent. It goes away completely between bouts.)    - If constant: "Is it getting better, staying the same, or getting worse?"      (Note: Constant means the pain never goes away completely; most serious pain is constant and gets worse.)      Pain in top of stomach 6. SEVERITY: "How bad is the pain?"  (e.g., Scale 1-10; mild, moderate, or severe)    - MILD (1-3): Doesn't interfere with normal activities, abdomen soft and not tender to touch.     - MODERATE (4-7): Interferes with normal activities or awakens from sleep, abdomen tender to touch.     - SEVERE (8-10): Excruciating pain, doubled over, unable to do any normal activities.       Moderate 7. RECURRENT SYMPTOM: "Have you ever had this type of stomach pain before?" If Yes, ask: "When was the last time?" and  "What happened that time?"      No 8. CAUSE: "What do you think is causing the stomach pain?"     I don't know 9. RELIEVING/AGGRAVATING FACTORS: "What makes it better or worse?" (e.g., antacids, bending or twisting motion, bowel movement)     Has not tried anything OTC for the vomiting.   The color of my vomit and urine is concerning. 10. OTHER SYMPTOMS: "Do you have any other symptoms?" (e.g., back pain, diarrhea, fever, urination pain, vomiting)       No diarrhea, no urine is very yellow    My stools are loose but not diarrhea 11. PREGNANCY: "Is there any chance you are pregnant?" "When was your last menstrual period?"       No  Protocols used: Abdominal Pain - [facility]

## 2022-10-03 ENCOUNTER — Ambulatory Visit: Payer: Self-pay | Admitting: *Deleted

## 2022-10-03 NOTE — Telephone Encounter (Signed)
  Chief Complaint: abdominal pain and worsening bloating Symptoms: see above. Pain radiates to back s/p hernia reported from last endoscopy. Bloating after eating and prilosec not helping with sx. Difficulty sleeping at times. Reports zanaflex would help with relaxing stomach but does not have refills.  Reports vomiting on Monday yellow emesis. Pain comes and goes moderate level. Frequency: 2 weeks  Pertinent Negatives: Patient denies chest pain no difficulty breathing no fever, no constant vomiting or diarrhea.  Disposition: [] ED /[] Urgent Care (no appt availability in office) / [x] Appointment(In office/virtual)/ []  [place] [facility]/ [] Home Care/ [] Refused Recommended Disposition /[]  Mobile Bus/ []  Follow-up with PCP Additional Notes:   Requesting earlier appt than 10/16/22. Did schedule patient with different provider other than PCP for tomorrow. Recommended if sx worsen go to ED.     Reason for Disposition  [1] MODERATE pain (e.g., interferes with normal activities) AND [2] pain comes and goes (cramps) AND [3] present > 24 hours  (Exception: Pain with Vomiting or Diarrhea - see that Guideline.)  Answer Assessment - Initial Assessment Questions 1. LOCATION: "Where does it hurt?"       "Mouth of the stomach" 2. RADIATION: "Does the pain shoot anywhere else?" (e.g., chest, back)     Toward the back and right side  3. ONSET: "When did the pain begin?" (e.g., minutes, hours or days ago)      2 weeks  4. SUDDEN: "Gradual or sudden onset?"     Gradually  5. PATTERN "Does the pain come and go, or is it constant?"    - If it comes and goes: "How long does it last?" "Do you have pain now?"     (Note: Comes and goes means the pain is intermittent. It goes away completely between bouts.)    - If constant: "Is it getting better, staying the same, or getting worse?"      (Note: Constant means the pain never goes away completely; most serious pain is constant and gets worse.)       Comes and goes  6. SEVERITY: "How bad is the pain?"  (e.g., Scale 1-10; mild, moderate, or severe)    - MILD (1-3): Doesn't interfere with normal activities, abdomen soft and not tender to touch.     - MODERATE (4-7): Interferes with normal activities or awakens from sleep, abdomen tender to touch.     - SEVERE (8-10): Excruciating pain, doubled over, unable to do any normal activities.       Moderate some sleeping issues at times. 7. RECURRENT SYMPTOM: "Have you ever had this type of stomach pain before?" If Yes, ask: "When was the last time?" and "What happened that time?"      Yes , endoscopy and found a hernia 8. CAUSE: "What do you think is causing the stomach pain?"     Not sure possible hernia issues  9. RELIEVING/AGGRAVATING FACTORS: "What makes it better or worse?" (e.g., antacids, bending or twisting motion, bowel movement)     Prilosec not helping  zanaflex helps to relax and sleep  10. OTHER SYMPTOMS: "Do you have any other symptoms?" (e.g., back pain, diarrhea, fever, urination pain, vomiting)       S/p hernia reported. Last Monday vomited yellow color. Feels bloating after eating  11. PREGNANCY: "Is there any chance you are pregnant?" "When was your last menstrual period?"       na  Protocols used: Abdominal Pain - [facility]

## 2022-10-04 ENCOUNTER — Encounter: Payer: Self-pay | Admitting: Internal Medicine

## 2022-10-04 ENCOUNTER — Ambulatory Visit: Payer: Self-pay | Attending: Internal Medicine | Admitting: Internal Medicine

## 2022-10-04 ENCOUNTER — Other Ambulatory Visit: Payer: Self-pay

## 2022-10-04 VITALS — BP 118/80 | HR 79 | Temp 98.2°F | Ht 64.0 in | Wt 175.0 lb

## 2022-10-04 DIAGNOSIS — M533 Sacrococcygeal disorders, not elsewhere classified: Secondary | ICD-10-CM

## 2022-10-04 DIAGNOSIS — J029 Acute pharyngitis, unspecified: Secondary | ICD-10-CM

## 2022-10-04 DIAGNOSIS — R1013 Epigastric pain: Secondary | ICD-10-CM

## 2022-10-04 MED ORDER — TIZANIDINE HCL 4 MG PO TABS
4.0000 mg | ORAL_TABLET | Freq: Three times a day (TID) | ORAL | 0 refills | Status: DC | PRN
  Filled 2022-10-04: qty 20, 7d supply, fill #0

## 2022-10-04 NOTE — Patient Instructions (Signed)
Please restart the omeprazole 40 mg daily.  Avoid eating spicy foods, foods with tomato paste or tomato sauce in it, juices.  Avoid taking any over-the-counter pain medications like ibuprofen Aleve or Advil.  Eat your last meal at least 2 to 3 hours before laying down at nights.  Sleep with your head slightly elevated.  For the sore throat, I recommend gargling with some warm water and salt intermittently over the next few days.

## 2022-10-04 NOTE — Progress Notes (Signed)
Patient ID: Danielle [name], female    DOB: 29-Apr-1975  MRN: [id number]  CC: Abdominal Pain (Abdominal pain, bloating x1 mo - worsening/)   Subjective: Danielle [name] is a 47 y.o. female who presents for UC visit.  Daughter is with her.  PCP is [facility].  Her concerns today include:   AMN Language interpreter used during this encounter. #[id number]   Patient with history of chronic epigastric pain. She has had her gallbladder removed.  Gallbladder ultrasound done last year revealed some hepatic steatosis; prior .  She had seen Dr. Adela Lank for the same 08/2021.  He had increased omeprazole to 40 mg twice a day and prescribed Carafate to use as needed.  Advised to stop NSAIDs. EGD and c-scope done.  Reports being told that she had a hernia.  Presents today complaining of abdominal pain and bloating x 1 month. Reports stomach is inflamed and thinks it is from hernia.  Feels tenderness and burning in the pit of her stomach when she eats.  Symptoms with any type of foods.  Not taking any thing for her symptoms.  Vomited 3 times 4 days ago.  No blood in stools.  No wgh loss.  Gained 16 lbs.  Throat feels inflamed after having a cold this past wk Had EGD/c-scope 02/2022 by Dr. Fritzi Mandes.  This did not reveal any concerning findings and H.pylori negative Not taking any NSAIDs at home. Has Omeprazole at home but not taking Patient Active Problem List   Diagnosis Date Noted   Hyperbilirubinemia 04/11/2017   Cholecystitis 04/07/2017   Gastritis and gastroduodenitis 02/20/2016   Back pain 09/21/2012   UTI 09/27/2009   BACK PAIN 09/27/2009   URINALYSIS, ABNORMAL 09/27/2009   VERTIGO 02/24/2009   SORE THROAT 10/07/2008   EXTERNAL HEMORRHOIDS 07/11/2008   RECTAL FISSURE 07/11/2008   DEHYDRATION 06/29/2008   PYELONEPHRITIS 06/29/2008   HEADACHE 06/29/2008     Current Outpatient Medications on File Prior to Visit  Medication Sig Dispense Refill   tiZANidine (ZANAFLEX) 4 MG tablet Take 1 tablet  (4 mg total) by mouth every 6 (six) hours as needed for muscle spasms. 30 tablet 0   fluticasone (FLONASE) 50 MCG/ACT nasal spray Place 2 sprays into both nostrils daily. (Patient not taking: Reported on 10/04/2022) 16 g 6   gabapentin (NEURONTIN) 300 MG capsule Take 1 capsule (300 mg total) by mouth at bedtime as needed for numbness (Patient not taking: Reported on 10/04/2022) 90 capsule 1   ibuprofen (ADVIL) 600 MG tablet Take 1 tablet (600 mg total) by mouth every 6 to 8 hours as needed pain. (Patient not taking: Reported on 10/04/2022) 16 tablet 0   omeprazole (PRILOSEC) 40 MG capsule Take 1 capsule (40 mg total) by mouth 2 (two) times daily. (Patient not taking: Reported on 10/04/2022) 60 capsule 3   No current facility-administered medications on file prior to visit.    Allergies  Allergen Reactions   Ampicillin Rash    Has patient had a PCN reaction causing immediate rash, facial/tongue/throat swelling, SOB or lightheadedness with hypotension: Yes Has patient had a PCN reaction causing severe rash involving mucus membranes or skin necrosis: Unk Has patient had a PCN reaction that required hospitalization: No Has patient had a PCN reaction occurring within the last 10 years: No If all of the above answers are "NO", then may proceed with Cephalosporin use.    Penicillins Rash    Has patient had a PCN reaction causing immediate rash, facial/tongue/throat swelling, SOB or lightheadedness with  hypotension: Yes Has patient had a PCN reaction causing severe rash involving mucus membranes or skin necrosis: Unk Has patient had a PCN reaction that required hospitalization: No Has patient had a PCN reaction occurring within the last 10 years: No If all of the above answers are "NO", then may proceed with Cephalosporin use.     Social History   Socioeconomic History   Marital status: Married    Spouse name: Not on file   Number of children: 3   Years of education: Not on file   Highest  education level: 9th grade  Occupational History   Not on file  Tobacco Use   Smoking status: Never    Passive exposure: Never   Smokeless tobacco: Never  Vaping Use   Vaping status: Never Used  Substance and Sexual Activity   Alcohol use: No   Drug use: No   Sexual activity: Yes    Birth control/protection: Surgical  Other Topics Concern   Not on file  Social History Narrative   Not on file   Social Determinants of Health   Financial Resource Strain: Not on file  Food Insecurity: No Food Insecurity (04/10/2021)   Hunger Vital Sign    Worried About Running Out of Food in the Last Year: Never true    Ran Out of Food in the Last Year: Never true  Transportation Needs: No Transportation Needs (04/10/2021)   PRAPARE - Administrator, Civil Service (Medical): No    Lack of Transportation (Non-Medical): No  Physical Activity: Not on file  Stress: Not on file  Social Connections: Not on file  Intimate Partner Violence: Not on file    Family History  Problem Relation Age of Onset   Thyroid disease Mother    Cancer Paternal Uncle    Colon cancer Neg Hx    Esophageal cancer Neg Hx    Colon polyps Neg Hx    Crohn's disease Neg Hx    Rectal cancer Neg Hx    Stomach cancer Neg Hx    Ulcerative colitis Neg Hx     Past Surgical History:  Procedure Laterality Date   ABDOMINAL HYSTERECTOMY  2007   CESAREAN SECTION  2007   CHOLECYSTECTOMY N/A 04/07/2017   Procedure: LAPAROSCOPIC CHOLECYSTECTOMY;  Surgeon: Rodman Pickle, MD;  Location: [facility] OR;  Service: General;  Laterality: N/A;   LAPAROSCOPIC CHOLECYSTECTOMY  04/07/2017    ROS: Review of Systems Negative except as stated above  PHYSICAL EXAM: BP 118/80 (BP Location: Left Arm, Patient Position: Sitting, Cuff Size: Normal)   Pulse 79   Temp 98.2 F (36.8 C) (Oral)   Ht 5\' 4"  (1.626 m)   Wt 175 lb (79.4 kg)   SpO2 99%   BMI 30.04 kg/m  Wt Readings from Last 3 Encounters:  10/04/22 175 lb (79.4 kg)   03/27/22 159 lb 9.6 oz (72.4 kg)  02/27/22 158 lb (71.7 kg)     Physical Exam  General appearance - alert, well appearing, middle-aged Hispanic female and in no distress Mental status - normal mood, behavior, speech, dress, motor activity, and thought processes Mouth -slight erythema to the posterior pharynx.  No exudates noted. Neck -no cervical lymphadenopathy.  No enlargement of the salivary glands. Abdomen - soft, nontender, nondistended, no masses or organomegaly.  Bowel sounds are active and normal.  No hernia appreciated.      Latest Ref Rng & Units 08/17/2021   12:40 PM 03/08/2021    9:27 AM 09/13/2020  3:30 PM  CMP  Glucose 70 - 99 mg/dL 94  956  94   BUN 6 - 23 mg/dL 14  13  18    Creatinine 0.40 - 1.20 mg/dL 2.13  0.86  5.78   Sodium 135 - 145 mEq/L 140  143  143   Potassium 3.5 - 5.1 mEq/L 3.8  4.5  4.3   Chloride 96 - 112 mEq/L 104  106  108   CO2 19 - 32 mEq/L 28  24  23    Calcium 8.4 - 10.5 mg/dL 9.4  9.4  9.5   Total Protein 6.0 - 8.3 g/dL 7.1  6.8  7.2   Total Bilirubin 0.2 - 1.2 mg/dL 1.0  0.5  0.8   Alkaline Phos 39 - 117 U/L 111  113  131   AST 0 - 37 U/L 19  17  16    ALT 0 - 35 U/L 30  23  22     Lipid Panel     Component Value Date/Time   CHOL 156 02/02/2019 1539   TRIG 108 02/02/2019 1539   HDL 52 02/02/2019 1539   CHOLHDL 3.0 02/02/2019 1539   CHOLHDL 3.5 10/12/2012 0926   VLDL 15 10/12/2012 0926   LDLCALC 84 02/02/2019 1539    CBC    Component Value Date/Time   WBC 7.3 12/12/2021 1535   WBC 4.8 08/14/2021 1207   RBC 4.72 12/12/2021 1535   RBC 4.82 08/14/2021 1207   HGB 13.6 12/12/2021 1535   HCT 41.9 12/12/2021 1535   PLT 211 12/12/2021 1535   MCV 89 12/12/2021 1535   MCH 28.8 12/12/2021 1535   MCH 29.0 04/12/2017 0614   MCHC 32.5 12/12/2021 1535   MCHC 33.0 08/14/2021 1207   RDW 13.2 12/12/2021 1535   LYMPHSABS 2.9 12/12/2021 1535   MONOABS 0.3 08/14/2021 1207   EOSABS 0.1 12/12/2021 1535   BASOSABS 0.0 12/12/2021 1535     ASSESSMENT AND PLAN: 1. Epigastric pain I suspect GERD.  No hernia appreciated on physical exam today.  She may have been told that she has a hiatal hernia. GERD precautions discussed.  Advised to avoid certain foods like spicy foods, tomato-based foods, juices and excessive caffeine.  Advised to eat his last meal at least 2 to 3 hours before laying down at nights and to sleep with his head slightly elevated.   Recommend restarting omeprazole 40 mg daily which she states she has at home.  2. Sorethroat Most likely viral pharyngitis.  Recommend gargling with warm water and salt  3. Sacral back pain Patient requested refill on tizanidine which was prescribed for her in the past for sacral back pain. - tiZANidine (ZANAFLEX) 4 MG tablet; Take 1 tablet (4 mg total) by mouth every 8 (eight) hours as needed for muscle spasms.  Dispense: 20 tablet; Refill: 0  Patient wanted to discuss weight gain.  I told her that I will have them schedule an appointment for her to come back and see her PCP to discuss.  Patient was given the opportunity to ask questions.  Patient verbalized understanding of the plan and was able to repeat key elements of the plan.   This documentation was completed using Paediatric nurse.  Any transcriptional errors are unintentional.  No orders of the defined types were placed in this encounter.    Requested Prescriptions    No prescriptions requested or ordered in this encounter    No follow-ups on file.  Jonah Blue, MD, FACP

## 2022-10-09 ENCOUNTER — Ambulatory Visit: Payer: Self-pay | Admitting: Physician Assistant

## 2022-10-16 ENCOUNTER — Other Ambulatory Visit: Payer: Self-pay

## 2022-10-16 ENCOUNTER — Ambulatory Visit: Payer: Self-pay | Attending: Physician Assistant | Admitting: Physician Assistant

## 2022-10-16 ENCOUNTER — Encounter: Payer: Self-pay | Admitting: Physician Assistant

## 2022-10-16 VITALS — BP 117/82 | HR 80 | Wt 176.6 lb

## 2022-10-16 DIAGNOSIS — R635 Abnormal weight gain: Secondary | ICD-10-CM

## 2022-10-16 DIAGNOSIS — R09A2 Foreign body sensation, throat: Secondary | ICD-10-CM

## 2022-10-16 DIAGNOSIS — Z603 Acculturation difficulty: Secondary | ICD-10-CM

## 2022-10-16 DIAGNOSIS — R1013 Epigastric pain: Secondary | ICD-10-CM

## 2022-10-16 DIAGNOSIS — Z758 Other problems related to medical facilities and other health care: Secondary | ICD-10-CM

## 2022-10-16 MED ORDER — PANTOPRAZOLE SODIUM 40 MG PO TBEC
40.0000 mg | DELAYED_RELEASE_TABLET | Freq: Every day | ORAL | 3 refills | Status: AC
Start: 2022-10-16 — End: ?
  Filled 2022-10-16: qty 30, 30d supply, fill #0
  Filled 2023-05-01: qty 30, 30d supply, fill #1

## 2022-10-16 NOTE — Progress Notes (Signed)
Patient ID: Danielle [name], female   DOB: 05/02/1975, 47 y.o.   MRN: [id number]   Danielle [name], is a 47 y.o. female  FAO:[id number]  ONG:[id number]  DOB - [birth year]  Chief Complaint  Patient presents with   Hernia       Subjective:   Danielle [name] is a 47 y.o. female here today for still having mid epigastric pain and burning in her stomach(see 7/26 note).  No further vomiting and recovered from URI.  Omeprazole not helping.  She says GI told her she has a hernia(I'm assuming Hiatal-but not noted on scope 02/2022) No diarrhea/melena/hematochezia.    No problems updated.  ALLERGIES: Allergies  Allergen Reactions   Ampicillin Rash    Has patient had a PCN reaction causing immediate rash, facial/tongue/throat swelling, SOB or lightheadedness with hypotension: Yes Has patient had a PCN reaction causing severe rash involving mucus membranes or skin necrosis: Unk Has patient had a PCN reaction that required hospitalization: No Has patient had a PCN reaction occurring within the last 10 years: No If all of the above answers are "NO", then may proceed with Cephalosporin use.    Penicillins Rash    Has patient had a PCN reaction causing immediate rash, facial/tongue/throat swelling, SOB or lightheadedness with hypotension: Yes Has patient had a PCN reaction causing severe rash involving mucus membranes or skin necrosis: Unk Has patient had a PCN reaction that required hospitalization: No Has patient had a PCN reaction occurring within the last 10 years: No If all of the above answers are "NO", then may proceed with Cephalosporin use.     PAST MEDICAL HISTORY: Past Medical History:  Diagnosis Date   Chronic back pain    Depression    History of blood transfusion 2007   "related to OR"    MEDICATIONS AT HOME: Prior to Admission medications   Medication Sig Start Date End Date Taking? Authorizing Provider  tiZANidine (ZANAFLEX) 4 MG tablet Take 1 tablet (4 mg total) by mouth  every 8 (eight) hours as needed for muscle spasms. Patient not taking: Reported on 10/16/2022 10/04/22   Marcine Matar, MD    ROS: Neg HEENT Neg resp Neg GU Neg MS Neg psych Neg neuro  Objective:   Vitals:   10/16/22 1419  BP: 117/82  Pulse: 80  SpO2: 96%  Weight: 176 lb 9.6 oz (80.1 kg)   Exam General appearance : Awake, alert, not in any distress. Speech Clear. Not toxic looking HEENT: Atraumatic and Normocephalic Neck: Supple, no JVD. No cervical lymphadenopathy.  Chest: Good air entry bilaterally, CTAB.  No rales/rhonchi/wheezing CVS: S1 S2 regular, no murmurs.  Abdomen: Bowel sounds present, Non tender and not distended with no gaurding, rigidity or rebound. There is mild tenderness in the mid epigastric area Extremities: B/L Lower Ext shows no edema, both legs are warm to touch Neurology: Awake alert, and oriented X 3, CN II-XII intact, Non focal Skin: No Rash  Data Review Lab Results  Component Value Date   HGBA1C 5.6 05/18/2014   HGBA1C 5.4 10/12/2012    Assessment & Plan   1. Globus sensation Stop omeprazole.  Start pantoprazole - Comprehensive metabolic panel - CBC with Differential/Platelet - H. pylori breath test  2. Epigastric pain Stop omeprazole.  Start pantoprazole - Comprehensive metabolic panel - CBC with Differential/Platelet - H. pylori breath test  3. Language barrier AMN "[place]" interpreters used and additional time performing visit was required.   4. Weight gain Increase activity and decrease sugar and starchy  foods - TSH    Return if symptoms worsen or fail to improve.  The patient was given clear instructions to go to ER or return to medical center if symptoms don't improve, worsen or new problems develop. The patient verbalized understanding. The patient was told to call to get lab results if they haven't heard anything in the next week.      Georgian Co, PA-C [facility]  [place], [place] [phone]   10/16/2022, 3:00 PM

## 2022-10-16 NOTE — Patient Instructions (Signed)
Hernia de hiato Hiatal Hernia  Una hernia de hiato se produce cuando parte del estmago se desliza por arriba del msculo que separa el abdomen del trax (diafragma). [place] persona puede nacer con una hernia de hiato (congnita), o esta puede formarse con Museum/gallery conservator. En casi todos los casos de hernia de hiato, solo la parte superior del estmago se protruye del diafragma. Muchas personas tienen hernia de hiato asintomtica. Cuanto ms grande es la hernia, ms probabilidades hay de que cause sntomas. En algunos casos, una hernia de hiato permite que el cido estomacal retorne al tubo que lleva la comida desde la boca al estmago (esfago). Esto puede provocar sntomas de Merchant navy officer. El desarrollo de los sntomas de [place] estomacal puede significar que usted experimenta una afeccin llamada enfermedad de reflujo gastroesofgico (ERGE). Cules son las causas? Esta afeccin se debe a una debilidad en el orificio (hiato) donde el esfago pasa a travs del diafragma y se une a la parte superior del [place]. [place] persona puede nacer con una debilidad en el hiato o la debilidad puede aparecer con [employer]. Qu incrementa el riesgo? Es ms probable que Dietitian en: Personas de edad avanzada. La edad es un factor de riesgo importante para la hernia de hiato, especialmente despus de los 50 aos de edad. Las [employer]. Las personas con sobrepeso. Personas con estreimiento frecuente. Cules son los signos o sntomas? Los sntomas de esta afeccin generalmente aparecen como sntomas de ERGE. Algunos de los sntomas son los siguientes: Merchant navy officer. Programme researcher, broadcasting/film/video (indigestin). Dificultad para tragar. Tos o sibilancias. Las sibilancias son sonidos agudos como silbidos al Industrial/product designer. Dolor de Advertising copywriter. Dolor en el pecho. Nuseas y vmitos. Cmo se diagnostica? Esta afeccin puede diagnosticarse durante las pruebas de la [place]. Podrn solicitarle otros estudios,  por ejemplo: Radiografas de estmago o de trax. Trnsito esofagogastroduodenal. Este es un examen radiogrfico del tracto gastrointestinal (GI) que se obtiene despus de que usted toma un lquido terroso que se ve claramente en la radiografa. Endoscopa. Este es un procedimiento para Psychologist, prison and probation services interior del estmago mediante el uso de un tubo delgado y flexible que tiene una pequea cmara y Nettie Elm en el extremo. Cmo se trata? El tratamiento para esta afeccin puede incluir lo siguiente: Cambios en la dieta y el estilo de vida para ayudar a disminuir los sntomas de [place]. Medicamentos. Pueden incluir: Anticidos de [employer]. Medicamentos que ayudan a Teaching laboratory technician con mayor rapidez. Medicamentos que inhiben la produccin de cido estomacal (antagonistas de los receptores H2). Medicamentos ms potentes para reducir la cantidad de cido estomacal (inhibidores de la bomba de protones). Ciruga para reparar la hernia si otros tratamientos no son eficaces. Si no tiene sntomas, tal vez no necesite tratamiento. Siga estas indicaciones en su casa: Estilo de vida y [place] No consuma ningn producto que contenga nicotina o tabaco. Estos productos incluyen cigarrillos, tabaco para Theatre manager y aparatos de vapeo, como los Administrator, Civil Service. Si necesita ayuda para dejar de consumir estos productos, consulte al [employer]. Intente llegar a un peso corporal saludable y [place]. Evite ejercer presin sobre el abdomen. Cualquier cosa que ejerza presin sobre el abdomen aumenta la cantidad de cido que puede retornar al esfago. No se agache, especialmente despus de comer. Eleve la cabecera de la cama con bloques debajo de las patas. Esto mantiene la cabeza y el esfago ms elevados que el [place]. No use prendas apretadas alrededor del pecho o del estmago. Intente no hacer fuerza al defecar, al  orinar o al levantar objetos pesados. Comida y bebida Evite los alimentos que pueden  intensificar los sntomas de [place]. Pueden incluir: Alimentos grasos, como los alimentos fritos. Frutas ctricas, como naranjas o limones. Otros alimentos y [address] que contengan cido, como jugo de naranja o tomates. Comidas condimentadas. Chocolate. Ingiera comidas pequeas con frecuencia en lugar de tres comidas abundantes por da. Esto ayuda a Environmental manager se llene demasiado. Coma lentamente. No se acueste enseguida despus de comer. No coma durante 1 o 2 horas antes de acostarse. No tome bebidas que contengan cafena. Estas incluyen bebidas cola, caf, cacao y t. No beba alcohol. Indicaciones generales Use los medicamentos de venta libre y los recetados solamente como se lo haya indicado el mdico. Concurra a todas las visitas de seguimiento. El mdico querr verificar si los medicamentos nuevos recetados estn ayudando a Paramedic los sntomas. Comunquese con un mdico si: No es posible controlar los sntomas con medicamentos o cambios en el estilo de vida. Tiene dificultad para tragar. Tiene tos o sibilancias que no desaparecen. El dolor [place]. El dolor se irradia a los [place], el cuello, la [place], los dientes o la espalda. Siente nuseas o vomita. Solicite ayuda de inmediato si: Le falta el aire. Vomita sangre. Observa sangre brillante en las heces. Las heces son negras, de aspecto alquitranado. Estos sntomas pueden Customer service manager. Solicite ayuda de inmediato. Llame al 911. No espere a ver si los sntomas desaparecen. No conduzca por sus propios medios [employer] hospital. Resumen Una hernia de hiato se produce cuando parte del estmago se desliza por arriba del msculo que separa el abdomen del trax. [place] persona puede nacer con una debilidad en el hiato o la debilidad puede aparecer con [employer]. Los sntomas de la hernia de hiato pueden incluir Merchant navy officer, dificultad para tragar o dolor de garganta. El control de la hernia de hiato incluye  ingerir comidas pequeas con frecuencia en lugar de tres comidas grandes al da. Obtenga ayuda de inmediato si vomita sangre, tiene sangre roja [employer] o tiene heces negras y alquitranadas. Esta informacin no tiene Theme park manager el consejo del mdico. Asegrese de hacerle al mdico cualquier pregunta que tenga. Document Revised: 05/10/2021 Document Reviewed: 05/10/2021 Elsevier Patient Education  2024 [employer].

## 2022-10-17 ENCOUNTER — Other Ambulatory Visit: Payer: Self-pay | Admitting: Physician Assistant

## 2022-10-17 DIAGNOSIS — R7989 Other specified abnormal findings of blood chemistry: Secondary | ICD-10-CM

## 2022-10-18 ENCOUNTER — Ambulatory Visit: Payer: Self-pay | Admitting: Nurse Practitioner

## 2022-10-23 ENCOUNTER — Telehealth: Payer: Self-pay

## 2022-10-23 NOTE — Telephone Encounter (Signed)
-----   Message from Georgian Co sent at 10/17/2022  4:52 PM EDT ----- Stomach ulcer test was negative but come in for blood work next week as previously directed.  Thanks, Georgian Co, PA-C

## 2022-10-23 NOTE — Telephone Encounter (Signed)
-----   Message from Georgian Co sent at 10/17/2022  8:51 AM EDT ----- Please call patient.  Her liver function is elevated which might be related to her recent illness and continued abdominal discomfort.  Ask her to avoid taking any tylenol or drinking any alcohol and come in next week to have repeat labs.  Also advise increasing water intake.  Thanks, Georgian Co, PA-C

## 2022-10-23 NOTE — Telephone Encounter (Signed)
Pt was called and vm was left, Information has been sent to nurse pool.   Interpreter id # [id number] miguel

## 2022-10-24 ENCOUNTER — Other Ambulatory Visit: Payer: Self-pay | Admitting: Internal Medicine

## 2022-10-24 DIAGNOSIS — M533 Sacrococcygeal disorders, not elsewhere classified: Secondary | ICD-10-CM

## 2022-10-24 MED ORDER — TIZANIDINE HCL 4 MG PO TABS
4.0000 mg | ORAL_TABLET | Freq: Three times a day (TID) | ORAL | 0 refills | Status: DC | PRN
  Filled 2022-10-24: qty 20, 7d supply, fill #0

## 2022-10-25 ENCOUNTER — Other Ambulatory Visit: Payer: Self-pay

## 2022-11-19 ENCOUNTER — Other Ambulatory Visit: Payer: Self-pay | Admitting: Family Medicine

## 2022-11-19 ENCOUNTER — Other Ambulatory Visit: Payer: Self-pay

## 2022-11-19 DIAGNOSIS — M533 Sacrococcygeal disorders, not elsewhere classified: Secondary | ICD-10-CM

## 2022-11-19 MED ORDER — TIZANIDINE HCL 4 MG PO TABS
4.0000 mg | ORAL_TABLET | Freq: Three times a day (TID) | ORAL | 0 refills | Status: DC | PRN
  Filled 2022-11-19: qty 20, 7d supply, fill #0

## 2022-11-22 ENCOUNTER — Other Ambulatory Visit: Payer: Self-pay

## 2022-12-11 ENCOUNTER — Other Ambulatory Visit: Payer: Self-pay | Admitting: Nurse Practitioner

## 2022-12-11 DIAGNOSIS — M533 Sacrococcygeal disorders, not elsewhere classified: Secondary | ICD-10-CM

## 2022-12-12 ENCOUNTER — Other Ambulatory Visit: Payer: Self-pay

## 2022-12-12 MED ORDER — TIZANIDINE HCL 4 MG PO TABS
4.0000 mg | ORAL_TABLET | Freq: Three times a day (TID) | ORAL | 0 refills | Status: DC | PRN
  Filled 2022-12-12: qty 20, 7d supply, fill #0

## 2022-12-12 NOTE — Telephone Encounter (Signed)
Requested medication (s) are due for refill today: Yes  Requested medication (s) are on the active medication list: Yes  Last refill:  11/19/22  Future visit scheduled: No  Notes to clinic:  Not delegated    Requested Prescriptions  Pending Prescriptions Disp Refills   tiZANidine (ZANAFLEX) 4 MG tablet 20 tablet 0    Sig: Take 1 tablet (4 mg total) by mouth every 8 (eight) hours as needed for muscle spasms.     Not Delegated - Cardiovascular:  Alpha-2 Agonists - tizanidine Failed - 12/11/2022 11:39 AM      Failed - This refill cannot be delegated      Passed - Valid encounter within last 6 months    Recent Outpatient Visits           1 month ago Globus sensation   [facility] [place], [place], [place]   2 months ago Epigastric pain   [place] [facility] Marcine Matar, MD   8 months ago Influenza B   [facility] [place], Shea Stakes, NP   1 year ago Paresthesia   [facility] [place], Marzella Schlein, [place]   1 year ago Epigastric pain    [facility] [place], Shea Stakes, NP

## 2022-12-16 ENCOUNTER — Other Ambulatory Visit: Payer: Self-pay

## 2023-01-03 ENCOUNTER — Other Ambulatory Visit: Payer: Self-pay | Admitting: Family Medicine

## 2023-01-03 DIAGNOSIS — M533 Sacrococcygeal disorders, not elsewhere classified: Secondary | ICD-10-CM

## 2023-01-03 MED ORDER — TIZANIDINE HCL 4 MG PO TABS
4.0000 mg | ORAL_TABLET | Freq: Three times a day (TID) | ORAL | 0 refills | Status: DC | PRN
  Filled 2023-01-03: qty 20, 7d supply, fill #0

## 2023-01-06 ENCOUNTER — Other Ambulatory Visit: Payer: Self-pay

## 2023-01-08 ENCOUNTER — Other Ambulatory Visit: Payer: Self-pay

## 2023-01-22 ENCOUNTER — Other Ambulatory Visit: Payer: Self-pay

## 2023-01-22 MED ORDER — CETIRIZINE HCL 10 MG PO TABS
10.0000 mg | ORAL_TABLET | Freq: Every day | ORAL | 3 refills | Status: AC
  Filled 2023-01-22: qty 90, 90d supply, fill #0
  Filled 2023-05-01 – 2023-12-23 (×2): qty 90, 90d supply, fill #1

## 2023-01-23 ENCOUNTER — Other Ambulatory Visit: Payer: Self-pay

## 2023-01-27 ENCOUNTER — Other Ambulatory Visit: Payer: Self-pay

## 2023-01-27 MED ORDER — ATORVASTATIN CALCIUM 40 MG PO TABS
40.0000 mg | ORAL_TABLET | Freq: Every day | ORAL | 1 refills | Status: AC
  Filled 2023-01-27: qty 90, 90d supply, fill #0
  Filled 2023-05-01: qty 90, 90d supply, fill #1

## 2023-01-30 ENCOUNTER — Other Ambulatory Visit: Payer: Self-pay | Admitting: Family Medicine

## 2023-01-30 DIAGNOSIS — M533 Sacrococcygeal disorders, not elsewhere classified: Secondary | ICD-10-CM

## 2023-01-30 MED ORDER — TIZANIDINE HCL 4 MG PO TABS
4.0000 mg | ORAL_TABLET | Freq: Three times a day (TID) | ORAL | 0 refills | Status: DC | PRN
  Filled 2023-01-30: qty 20, 7d supply, fill #0

## 2023-01-31 ENCOUNTER — Other Ambulatory Visit: Payer: Self-pay

## 2023-02-03 ENCOUNTER — Other Ambulatory Visit: Payer: Self-pay

## 2023-02-18 ENCOUNTER — Other Ambulatory Visit: Payer: Self-pay | Admitting: Family Medicine

## 2023-02-18 DIAGNOSIS — M533 Sacrococcygeal disorders, not elsewhere classified: Secondary | ICD-10-CM

## 2023-02-20 ENCOUNTER — Other Ambulatory Visit: Payer: Self-pay

## 2023-02-21 ENCOUNTER — Other Ambulatory Visit (HOSPITAL_COMMUNITY): Payer: Self-pay

## 2023-02-21 ENCOUNTER — Other Ambulatory Visit: Payer: Self-pay

## 2023-02-21 MED ORDER — TIZANIDINE HCL 4 MG PO TABS
4.0000 mg | ORAL_TABLET | Freq: Every evening | ORAL | 0 refills | Status: AC
  Filled 2023-02-21: qty 90, 90d supply, fill #0

## 2023-02-21 MED ORDER — TIZANIDINE HCL 4 MG PO TABS
4.0000 mg | ORAL_TABLET | Freq: Three times a day (TID) | ORAL | 0 refills | Status: AC | PRN
  Filled 2023-02-21 – 2023-05-01 (×2): qty 20, 7d supply, fill #0

## 2023-02-22 ENCOUNTER — Other Ambulatory Visit (HOSPITAL_COMMUNITY): Payer: Self-pay

## 2023-02-24 ENCOUNTER — Other Ambulatory Visit (HOSPITAL_COMMUNITY): Payer: Self-pay

## 2023-02-26 ENCOUNTER — Other Ambulatory Visit (HOSPITAL_COMMUNITY): Payer: Self-pay

## 2023-02-27 ENCOUNTER — Other Ambulatory Visit: Payer: Self-pay | Admitting: Internal Medicine

## 2023-02-27 DIAGNOSIS — Z1231 Encounter for screening mammogram for malignant neoplasm of breast: Secondary | ICD-10-CM

## 2023-03-20 ENCOUNTER — Ambulatory Visit
Admission: RE | Admit: 2023-03-20 | Discharge: 2023-03-20 | Disposition: A | Discharge status: Home / Self Care | Payer: No Typology Code available for payment source | Source: Ambulatory Visit | Attending: Internal Medicine | Admitting: Internal Medicine

## 2023-03-20 DIAGNOSIS — Z1231 Encounter for screening mammogram for malignant neoplasm of breast: Secondary | ICD-10-CM

## 2023-05-01 ENCOUNTER — Other Ambulatory Visit: Payer: Self-pay

## 2023-05-09 ENCOUNTER — Other Ambulatory Visit (HOSPITAL_COMMUNITY): Payer: Self-pay

## 2023-05-09 ENCOUNTER — Other Ambulatory Visit: Payer: Self-pay

## 2023-05-09 MED ORDER — ONDANSETRON HCL 4 MG PO TABS
4.0000 mg | ORAL_TABLET | Freq: Three times a day (TID) | ORAL | 0 refills | Status: AC | PRN
  Filled 2023-05-09 – 2023-09-05 (×3): qty 42, 14d supply, fill #0

## 2023-05-09 MED ORDER — TIZANIDINE HCL 4 MG PO TABS
4.0000 mg | ORAL_TABLET | Freq: Every evening | ORAL | 0 refills | Status: AC | PRN
  Filled 2023-05-09 – 2023-05-12 (×3): qty 90, 90d supply, fill #0

## 2023-05-12 ENCOUNTER — Other Ambulatory Visit: Payer: Self-pay

## 2023-05-16 ENCOUNTER — Other Ambulatory Visit: Payer: Self-pay

## 2023-05-16 ENCOUNTER — Other Ambulatory Visit (HOSPITAL_COMMUNITY): Payer: Self-pay

## 2023-05-21 ENCOUNTER — Other Ambulatory Visit (HOSPITAL_COMMUNITY): Payer: Self-pay

## 2023-09-04 ENCOUNTER — Other Ambulatory Visit (HOSPITAL_COMMUNITY): Payer: Self-pay

## 2023-09-04 MED ORDER — NITROFURANTOIN MONOHYD MACRO 100 MG PO CAPS
100.0000 mg | ORAL_CAPSULE | Freq: Two times a day (BID) | ORAL | 0 refills | Status: AC
  Filled 2023-09-04 – 2023-09-05 (×2): qty 14, 7d supply, fill #0

## 2023-09-05 ENCOUNTER — Other Ambulatory Visit (HOSPITAL_COMMUNITY): Payer: Self-pay

## 2023-09-05 ENCOUNTER — Other Ambulatory Visit: Payer: Self-pay

## 2023-09-11 ENCOUNTER — Other Ambulatory Visit (HOSPITAL_COMMUNITY): Payer: Self-pay

## 2023-09-19 ENCOUNTER — Other Ambulatory Visit: Payer: Self-pay

## 2023-09-19 ENCOUNTER — Other Ambulatory Visit: Payer: Self-pay | Admitting: Family Medicine

## 2023-09-19 ENCOUNTER — Other Ambulatory Visit (HOSPITAL_COMMUNITY): Payer: Self-pay

## 2023-09-19 DIAGNOSIS — K449 Diaphragmatic hernia without obstruction or gangrene: Secondary | ICD-10-CM

## 2023-09-19 DIAGNOSIS — R112 Nausea with vomiting, unspecified: Secondary | ICD-10-CM

## 2023-09-19 DIAGNOSIS — R1013 Epigastric pain: Secondary | ICD-10-CM

## 2023-09-19 MED ORDER — TIZANIDINE HCL 4 MG PO TABS
4.0000 mg | ORAL_TABLET | Freq: Every evening | ORAL | 1 refills | Status: AC
  Filled 2023-09-19: qty 90, 90d supply, fill #0
  Filled 2023-12-22: qty 90, 90d supply, fill #1
  Filled 2023-12-23: qty 90, 90d supply, fill #0

## 2023-09-22 ENCOUNTER — Other Ambulatory Visit

## 2023-09-24 ENCOUNTER — Other Ambulatory Visit: Payer: Self-pay

## 2023-09-24 MED ORDER — ATORVASTATIN CALCIUM 40 MG PO TABS
40.0000 mg | ORAL_TABLET | Freq: Every evening | ORAL | 1 refills | Status: AC
  Filled 2023-09-24: qty 90, 90d supply, fill #0
  Filled 2023-12-23 (×2): qty 90, 90d supply, fill #1

## 2023-09-25 ENCOUNTER — Other Ambulatory Visit: Payer: Self-pay

## 2023-12-23 ENCOUNTER — Other Ambulatory Visit: Payer: Self-pay

## 2023-12-23 ENCOUNTER — Other Ambulatory Visit (HOSPITAL_COMMUNITY): Payer: Self-pay
